# Patient Record
Sex: Male | Born: 1945 | Race: Black or African American | Hispanic: No | Marital: Married | Smoking: Never smoker
Health system: Southern US, Community
[De-identification: ages and names within clinical notes are randomized; demographics above are authoritative.]

## PROBLEM LIST (undated history)

## (undated) DIAGNOSIS — I10 Essential (primary) hypertension: Secondary | ICD-10-CM

## (undated) DIAGNOSIS — M1990 Unspecified osteoarthritis, unspecified site: Secondary | ICD-10-CM

## (undated) DIAGNOSIS — E7800 Pure hypercholesterolemia, unspecified: Secondary | ICD-10-CM

## (undated) DIAGNOSIS — M1611 Unilateral primary osteoarthritis, right hip: Secondary | ICD-10-CM

## (undated) DIAGNOSIS — E669 Obesity, unspecified: Secondary | ICD-10-CM

## (undated) DIAGNOSIS — K625 Hemorrhage of anus and rectum: Secondary | ICD-10-CM

## (undated) DIAGNOSIS — R011 Cardiac murmur, unspecified: Secondary | ICD-10-CM

## (undated) DIAGNOSIS — C61 Malignant neoplasm of prostate: Secondary | ICD-10-CM

## (undated) HISTORY — DX: Unspecified osteoarthritis, unspecified site: M19.90

## (undated) HISTORY — DX: Essential (primary) hypertension: I10

## (undated) HISTORY — PX: INGUINAL HERNIA REPAIR: SUR1180

## (undated) HISTORY — DX: Obesity, unspecified: E66.9

---

## 2002-06-13 ENCOUNTER — Encounter: Admission: RE | Admit: 2002-06-13 | Discharge: 2002-09-11 | Payer: Self-pay | Admitting: Family Medicine

## 2002-09-26 ENCOUNTER — Encounter: Admission: RE | Admit: 2002-09-26 | Discharge: 2002-12-25 | Payer: Self-pay | Admitting: Family Medicine

## 2002-12-18 ENCOUNTER — Ambulatory Visit (HOSPITAL_COMMUNITY): Admission: RE | Admit: 2002-12-18 | Discharge: 2002-12-18 | Payer: Self-pay | Admitting: Gastroenterology

## 2005-12-25 ENCOUNTER — Ambulatory Visit (HOSPITAL_BASED_OUTPATIENT_CLINIC_OR_DEPARTMENT_OTHER): Admission: RE | Admit: 2005-12-25 | Discharge: 2005-12-25 | Payer: Self-pay | Admitting: General Surgery

## 2005-12-25 ENCOUNTER — Encounter (INDEPENDENT_AMBULATORY_CARE_PROVIDER_SITE_OTHER): Payer: Self-pay | Admitting: *Deleted

## 2007-03-18 ENCOUNTER — Ambulatory Visit (HOSPITAL_BASED_OUTPATIENT_CLINIC_OR_DEPARTMENT_OTHER): Admission: RE | Admit: 2007-03-18 | Discharge: 2007-03-18 | Payer: Self-pay | Admitting: General Surgery

## 2007-03-18 ENCOUNTER — Encounter (INDEPENDENT_AMBULATORY_CARE_PROVIDER_SITE_OTHER): Payer: Self-pay | Admitting: General Surgery

## 2007-04-20 ENCOUNTER — Emergency Department (HOSPITAL_COMMUNITY): Admission: EM | Admit: 2007-04-20 | Discharge: 2007-04-20 | Payer: Self-pay | Admitting: Emergency Medicine

## 2009-08-10 DIAGNOSIS — C61 Malignant neoplasm of prostate: Secondary | ICD-10-CM

## 2009-08-10 HISTORY — PX: ROBOT ASSISTED LAPAROSCOPIC RADICAL PROSTATECTOMY: SHX5141

## 2009-08-10 HISTORY — DX: Malignant neoplasm of prostate: C61

## 2009-08-14 ENCOUNTER — Inpatient Hospital Stay (HOSPITAL_COMMUNITY): Admission: RE | Admit: 2009-08-14 | Discharge: 2009-08-15 | Payer: Self-pay | Admitting: Urology

## 2009-08-14 ENCOUNTER — Encounter (INDEPENDENT_AMBULATORY_CARE_PROVIDER_SITE_OTHER): Payer: Self-pay | Admitting: Urology

## 2010-10-26 LAB — BASIC METABOLIC PANEL
BUN: 17 mg/dL (ref 6–23)
CO2: 27 mEq/L (ref 19–32)
Calcium: 8.3 mg/dL — ABNORMAL LOW (ref 8.4–10.5)
Calcium: 8.6 mg/dL (ref 8.4–10.5)
Chloride: 103 mEq/L (ref 96–112)
Creatinine, Ser: 0.9 mg/dL (ref 0.4–1.5)
GFR calc Af Amer: >60 mL/min (ref >60)
GFR calc Af Amer: >60 mL/min (ref >60)
GFR calc non Af Amer: >60 mL/min (ref >60)
Glucose, Bld: 132 mg/dL — ABNORMAL HIGH (ref 70–99)
Potassium: 3.9 mEq/L (ref 3.5–5.1)
Sodium: 134 mEq/L — ABNORMAL LOW (ref 135–145)

## 2010-10-26 LAB — CBC
HCT: 36.3 % — ABNORMAL LOW (ref 39.0–52.0)
Hemoglobin: 11.9 g/dL — ABNORMAL LOW (ref 13.0–17.0)
MCHC: 32.7 g/dL (ref 30.0–36.0)
MCV: 85.3 fL (ref 78.0–100.0)
Platelets: 336 10*3/uL (ref 150–400)
RBC: 4.25 MIL/uL (ref 4.22–5.81)
RBC: 4.89 MIL/uL (ref 4.22–5.81)
RDW: 13.8 % (ref 11.5–15.5)
RDW: 14 % (ref 11.5–15.5)
WBC: 8.1 10*3/uL (ref 4.0–10.5)

## 2010-10-26 LAB — DIFFERENTIAL
Basophils Absolute: 0 10*3/uL (ref 0.0–0.1)
Basophils Absolute: 0 10*3/uL (ref 0.0–0.1)
Basophils Relative: 0 % (ref 0–1)
Eosinophils Absolute: 0 10*3/uL (ref 0.0–0.7)
Eosinophils Relative: 0 % (ref 0–5)
Lymphocytes Relative: 17 % (ref 12–46)
Lymphs Abs: 1.4 10*3/uL (ref 0.7–4.0)
Monocytes Absolute: 0.8 10*3/uL (ref 0.1–1.0)
Monocytes Relative: 2 % — ABNORMAL LOW (ref 3–12)
Neutro Abs: 19.2 10*3/uL — ABNORMAL HIGH (ref 1.7–7.7)
Neutro Abs: 5.9 10*3/uL (ref 1.7–7.7)
Neutrophils Relative %: 89 % — ABNORMAL HIGH (ref 43–77)

## 2010-10-26 LAB — TYPE AND SCREEN
ABO/RH(D): B POS
Antibody Screen: NEGATIVE

## 2010-10-26 LAB — ABO/RH: ABO/RH(D): B POS

## 2010-11-10 LAB — CBC
HCT: 41 % (ref 39.0–52.0)
Hemoglobin: 13.6 g/dL (ref 13.0–17.0)
MCV: 84.5 fL (ref 78.0–100.0)
RDW: 14.1 % (ref 11.5–15.5)

## 2010-12-23 NOTE — Op Note (Signed)
NAMECARRY, ORTEZ                ACCOUNT NO.:  [id number]   MEDICAL RECORD NO.:  [id number]          PATIENT TYPE:  AMB   LOCATION:  [facility]                         FACILITY:  [facility]   PHYSICIAN:  Anselm Pancoast. Weatherly, M.D.DATE OF BIRTH:  12/16/1945   DATE OF PROCEDURE:  03/18/2007  DATE OF DISCHARGE:                               OPERATIVE REPORT   PREOPERATIVE DIAGNOSIS:  Large right inguinal hernia.   POSTOPERATIVE DIAGNOSIS:  Large right inguinal hernia.   OPERATION:  Right inguinal herniorrhaphy, combination indirect direct.   ANESTHESIA:  General.   HISTORY:  Lance Rollins is a 65 year old fairly large male who has had a  previous left inguinal hernia repair by Dr. Lurene Shadow about ten years ago  and then I removed a large lipoma on his buttocks approximately a year  ago and he returns now with a symptomatic large right inguinal hernia  that he desires to have removed. This appears to be going down with the  cord structures and whether it is a direct or indirect, I was not  positive when I examined him in the office.  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   DESCRIPTION OF PROCEDURE:  The patient was taken to the operative suite.  He was given his antibiotic, Ancef, preoperatively, and we are planned  to do him under basically general anesthesia.  An LOA tube was placed  instead of an endotracheal tube and the groin area was first clipped and  then prepped with Betadine surgical scrub solution.  Dr. Danella Penton  incision was a fairly high transverse pediatric, I think with the large  size of the hernia, I would prefer doing one a little more oblique which  I did.  After draping in a sterile manner, I first block the  ilioinguinal nerve area using 10 mL of 0.25% Marcaine with adrenalin.  I  then made an inguinal incision.   Sharp dissection down through the  approximately 2-3 inches of adipose tissue.  Next, the external oblique  aponeurosis was opened a little bit laterally, it had completely  destroyed the external oblique, stretched it because of the large  hernia.  I then encompassed the cord structures, I think there were four  little veins in the subcutaneous tissue that were clamped and ligated  with fine Vicryl.  I then tried to get a Weitlaner in to actually hold  the external oblique, it really did not want to lie flat.  The nurse was  using Brewsters to expose the area and then I elevated the cord  structures with a Penrose drain. The hernia was about size of a big  orange and the cord structures were most intimately attached.  We tried  to separate this stretched hernia sac, and this is predominantly an  indirect hernia that has just been there for years, from the actual cord  structures.  There was a lot of fatty tissue, some that was  actually  intraperitoneally on the area, and we opened the cord structures and  then tried to separate everything from the cord structures and it  appeared that the veins and artery and vas that we kind of separated the  two with the hernia sort of pushing down sort of more central in the  cord structures than is usual typical. Exposure was very difficult and  we were able to get an additional scrub nurse from the main OR who  scrubbed in after about 1 1/2 hours and greatly appreciated her help.  With the added exposure, I was then actually able to kind of separate  the very thickened hernia sac so I could do a high sac ligation and this  was kind of a running 2-0 Vicryl closing everything going back with a  second layer of sutures and the best of my knowledge, I think the  artery, vein, and vas were separated.  There were several little veins.  Next, the actual floor, which had been stretched and basically destroyed  but not truly definite direct inguinal hernia,  was then closed with  running 2-0 Prolene suture reinforcing it and creating a new internal  ring.  The suture was taken back and sutured to the original start and  then I used a piece of Prolene mesh shaped like a sail, slit laterally,  and sutured into the under edge of the inguinal ligament inferiorly, the  two tails going around the newly created internal ring and these were  incorporated with the running suture laterally.  I was taking care not  to make the mesh too tight since I fear he is going to have some  testicular swelling anyway because of the degree of dissection that was  necessary.  The superior flap was sutured down with interrupted sutures  of 2-0 Prolene.  I then used about 10 mL of Marcaine with adrenalin in  the floor for immediate postoperative pain and put about another 5 mL in  the subcutaneous skin area.  Next, the superior flap was laying flat and  not under excessive tension, the ilioinguinal nerve was coming out with  the cord structures, and I then closed the external oblique with a  running 2-0 Vicryl.  Scarpa's fascia was closed with interrupted 3-0  Vicryl, 4-0 Dexon subcuticular, and then Benzoin and Steri-Strips on the  skin.  The patient's testicle was in its normal position and there was a  little bit of oozing of the skin and a nurse asked about Toradol and I  would prefer not giving him Toradol at this time.  The patient tolerated  the procedure.  I would like for him to void before he relieves, and  then will be kind of be cautious on his liquids and diet today,  hopefully not get distended or nauseous.  The patient will be seen in  the office in approximately a week and whether he will be ready to go  back to work in 2-3 weeks will be determined when I see him back in the  office.           ______________________________  Anselm Pancoast. Zachery Dakins, M.D.     WJW/MEDQ  D:  03/18/2007  T:  03/19/2007  Job:  [id number]   cc:   Vikki Ports, M.D.   Fax: [phone]

## 2010-12-26 NOTE — Op Note (Signed)
[name], [name]                ACCOUNT NO.:  [id number]   MEDICAL RECORD NO.:  [id number]          PATIENT TYPE:  AMB   LOCATION:  [facility]                          FACILITY:  [facility]   PHYSICIAN:  Anselm Pancoast. Weatherly, M.D.DATE OF BIRTH:  [birth year]   DATE OF PROCEDURE:  12/25/2005  DATE OF DISCHARGE:                                 OPERATIVE REPORT   PREOPERATIVE DIAGNOSES:  Lipoma left buttock. He has also got one on the  left cheek that he would like excised.   POSTOPERATIVE DIAGNOSES:  Not given.   OPERATION:  Excision of lipoma greater than 4 cm on left hip and  approximately 2 cm lipoma excision from left cheek.   ANESTHESIA:  Local anesthesia, 1% Xylocaine with adrenaline.   HISTORY:  Mr. Lance Rollins is a 65 year old male who was referred to me by  Dr. Theresia Lo for a buttock lesion that is greater than 4 cm that has been  there for several years. He has a family history of lipomas and Dr. Theresia Lo  first noticed this area on the hip about 2 years ago and advised him to  leave it alone. It appears to be increasing in size. He has had a brother  that recently had a very large lipoma removed from his back and the patient  said he would like to have it removed. When I saw him in the office, this  appeared to be an obvious benign lipoma, you could kind of push it around  and we ultrasounded it at the office to kind of confirm that it is spherical  and characteristically of a benign lipoma. The patient is on no blood  thinners and works for [employer]. He presented today and said he has  got one on his cheek that he had not noted, he pointed out to me when I saw  him in the office and would like to have it also excised. It is about 2 cm  and has been present for greater than 10 years but kind of gradually  increasing in size.   I positioned him first on the OR table laying right side down, hip slightly  flexed, and then prepped the area widely with Betadine solution and  draped  it in a sterile manner. A field block was used with about 10 mL of 1%  Xylocaine with adrenaline and then I injected over the area. It had been  marked circumferentially with a magic marker for sizing. An incision running  longitudinally was placed. Dissected down, the lipoma could be separated  from the surrounding tissue without too much difficulty and was a large  lipoma as we thought. The little pedicle for the bleeders were controlled  with 4-0 Vicryl and then the cavity was closed with some 4-0 Vicryl in the  fascia and then 3-0 nylon simple sutures on the skin. Next, using the same  prep but different gloves and etc., we prepped first the area of the face  after it had been marked and then made about a 1/2 inch incision and then  the underlying fatty tissue could  be teased from the surrounding normal  fatty tissue and it was completely excised. It was not necessary to put any  type of sutures for the pedicle on the one on the face and then the face was  closed with little interrupted simple  sutures of 5-0 nylon. The patient tolerated the procedure without any  significant discomfort and will be released. I will see him in the office  next week. I would like to remove the sutures from the face at the end of  the week but will leave the stitches on the buttocks because of the pressure  applied and his size for an additional week.           ______________________________  Anselm Pancoast. Zachery Dakins, M.D.     WJW/MEDQ  D:  12/25/2005  T:  12/25/2005  Job:  [id number]   cc:   Vikki Ports, M.D.  Fax: [phone]

## 2010-12-26 NOTE — Op Note (Signed)
   [name], Lance Rollins                            ACCOUNT NO.:  [id number]   MEDICAL RECORD NO.:  [id number]                   PATIENT TYPE:  AMB   LOCATION:  ENDO                                 FACILITY:  [facility]   PHYSICIAN:  Danise Edge, M.D.                DATE OF BIRTH:  03-Nov-1945   DATE OF PROCEDURE:  12/18/2002  DATE OF DISCHARGE:                                 OPERATIVE REPORT   REFERRING PHYSICIAN:  Vikki Ports, M.D.   PROCEDURE:  Screening colonoscopy.   PROCEDURE INDICATION:  Lance Rollins is a 65 year old male, born [birth year].  Lance Rollins is scheduled to undergo his first screening colonoscopy  with polypectomy to prevent colon cancer.   ENDOSCOPIST:  Charolett Bumpers, M.D.   PREMEDICATION:  1. Versed 10 mg.  2. Demerol 50 mg.   DESCRIPTION OF PROCEDURE:  After obtaining informed consent, Lance Rollins was  placed in the left lateral decubitus position.  I administered intravenous  Demerol and intravenous Versed to achieve conscious sedation for the  procedure.  The patient's blood pressure, oxygen saturation, and cardiac  rhythm were monitored throughout the procedure and documented in the medical  record.   Anal inspection was normal.  Digital rectal exam revealed a nonnodular  prostate.  The Olympus adult colonoscope was introduced into the rectum and  easily advanced to the cecum.  Colonic preparation for the exam today was  excellent.   RECTUM:  Normal.  SIGMOID COLON AND DESCENDING COLON:  Left colonic diverticulosis.  SPLENIC FLEXURE:  Normal.  TRANSVERSE COLON:  Colonic diverticulosis.  HEPATIC FLEXURE:  Normal.  ASCENDING COLON:  Normal.  CECUM AND ILEOCECAL VALVE:  Normal.    ASSESSMENT:  1. Colonic diverticulosis is noted in the transverse colon and left colon;     otherwise, normal proctocolonoscopy to the cecum.  2. No endoscopic evidence for the presence of colorectal neoplasia.     Danise Edge, M.D.    MJ/MEDQ  D:  12/18/2002  T:  12/18/2002  Job:  [id number]   cc:   Vikki Ports, M.D.  [address]  [place]  [place] [address]  Fax: [phone]

## 2011-05-25 LAB — DIFFERENTIAL
Lymphocytes Relative: 39
Lymphs Abs: 2.8
Monocytes Relative: 10
Neutro Abs: 3.5
Neutrophils Relative %: 47

## 2011-05-25 LAB — COMPREHENSIVE METABOLIC PANEL
Albumin: 3.6
BUN: 21
Calcium: 9.1
Glucose, Bld: 93
Sodium: 138
Total Protein: 7.3

## 2011-05-25 LAB — CBC
HCT: 40.8
Hemoglobin: 13.3
MCHC: 32.6
Platelets: 336
RDW: 13.7

## 2013-08-10 DIAGNOSIS — K625 Hemorrhage of anus and rectum: Secondary | ICD-10-CM

## 2013-08-10 HISTORY — DX: Hemorrhage of anus and rectum: K62.5

## 2013-08-14 ENCOUNTER — Encounter (HOSPITAL_COMMUNITY): Payer: Self-pay | Admitting: Emergency Medicine

## 2013-08-14 ENCOUNTER — Observation Stay (HOSPITAL_COMMUNITY)
Admission: EM | Admit: 2013-08-14 | Discharge: 2013-08-15 | Disposition: A | Discharge status: Home / Self Care | Payer: Medicare Other | Attending: Family Medicine | Admitting: Family Medicine

## 2013-08-14 DIAGNOSIS — R42 Dizziness and giddiness: Secondary | ICD-10-CM | POA: Insufficient documentation

## 2013-08-14 DIAGNOSIS — R1012 Left upper quadrant pain: Secondary | ICD-10-CM | POA: Insufficient documentation

## 2013-08-14 DIAGNOSIS — K317 Polyp of stomach and duodenum: Secondary | ICD-10-CM

## 2013-08-14 DIAGNOSIS — D131 Benign neoplasm of stomach: Secondary | ICD-10-CM | POA: Insufficient documentation

## 2013-08-14 DIAGNOSIS — D62 Acute posthemorrhagic anemia: Secondary | ICD-10-CM | POA: Insufficient documentation

## 2013-08-14 DIAGNOSIS — E7800 Pure hypercholesterolemia, unspecified: Secondary | ICD-10-CM | POA: Insufficient documentation

## 2013-08-14 DIAGNOSIS — K2970 Gastritis, unspecified, without bleeding: Secondary | ICD-10-CM

## 2013-08-14 DIAGNOSIS — K2990 Gastroduodenitis, unspecified, without bleeding: Secondary | ICD-10-CM

## 2013-08-14 DIAGNOSIS — K921 Melena: Secondary | ICD-10-CM

## 2013-08-14 DIAGNOSIS — K922 Gastrointestinal hemorrhage, unspecified: Secondary | ICD-10-CM | POA: Diagnosis present

## 2013-08-14 DIAGNOSIS — Z8546 Personal history of malignant neoplasm of prostate: Secondary | ICD-10-CM | POA: Insufficient documentation

## 2013-08-14 DIAGNOSIS — K2940 Chronic atrophic gastritis without bleeding: Secondary | ICD-10-CM | POA: Insufficient documentation

## 2013-08-14 HISTORY — DX: Hemorrhage of anus and rectum: K62.5

## 2013-08-14 HISTORY — DX: Malignant neoplasm of prostate: C61

## 2013-08-14 HISTORY — DX: Pure hypercholesterolemia, unspecified: E78.00

## 2013-08-14 LAB — COMPREHENSIVE METABOLIC PANEL
Albumin: 3.3 g/dL — ABNORMAL LOW (ref 3.5–5.2)
Alkaline Phosphatase: 75 U/L (ref 39–117)
BUN: 30 mg/dL — ABNORMAL HIGH (ref 6–23)
CO2: 23 mEq/L (ref 19–32)
Chloride: 104 mEq/L (ref 96–112)
GFR calc Af Amer: 84 mL/min — ABNORMAL LOW (ref >90)
GFR calc non Af Amer: 73 mL/min — ABNORMAL LOW (ref >90)
Glucose, Bld: 119 mg/dL — ABNORMAL HIGH (ref 70–99)
Potassium: 4.3 mEq/L (ref 3.7–5.3)
Total Bilirubin: 0.4 mg/dL (ref 0.3–1.2)

## 2013-08-14 LAB — CBC
HCT: 31.2 % — ABNORMAL LOW (ref 39.0–52.0)
Hemoglobin: 10.4 g/dL — ABNORMAL LOW (ref 13.0–17.0)
MCH: 28 pg (ref 26.0–34.0)
MCHC: 33.3 g/dL (ref 30.0–36.0)
MCV: 83.9 fL (ref 78.0–100.0)
Platelets: 297 10*3/uL (ref 150–400)
RBC: 3.72 MIL/uL — ABNORMAL LOW (ref 4.22–5.81)
RDW: 14.7 % (ref 11.5–15.5)
WBC: 9.3 10*3/uL (ref 4.0–10.5)

## 2013-08-14 LAB — TYPE AND SCREEN
ABO/RH(D): B POS
Antibody Screen: NEGATIVE

## 2013-08-14 LAB — COMPREHENSIVE METABOLIC PANEL WITH GFR
ALT: 18 U/L (ref 0–53)
AST: 15 U/L (ref 0–37)
Calcium: 9 mg/dL (ref 8.4–10.5)
Creatinine, Ser: 1.05 mg/dL (ref 0.50–1.35)
Sodium: 140 meq/L (ref 137–147)
Total Protein: 6.8 g/dL (ref 6.0–8.3)

## 2013-08-14 LAB — OCCULT BLOOD, POC DEVICE: Fecal Occult Bld: POSITIVE — AB

## 2013-08-14 LAB — HEMOGLOBIN AND HEMATOCRIT, BLOOD
HEMATOCRIT: 29.4 % — AB (ref 39.0–52.0)
HEMATOCRIT: 27.6 % — AB (ref 39.0–52.0)
HEMOGLOBIN: 9.7 g/dL — AB (ref 13.0–17.0)
Hemoglobin: 9.3 g/dL — ABNORMAL LOW (ref 13.0–17.0)

## 2013-08-14 LAB — ABO/RH: ABO/RH(D): B POS

## 2013-08-14 MED ORDER — PANTOPRAZOLE SODIUM 40 MG IV SOLR
40.0000 mg | Freq: Two times a day (BID) | INTRAVENOUS | Status: DC
  Administered 2013-08-14 – 2013-08-15 (×2): 40 mg via INTRAVENOUS
  Filled 2013-08-14 (×5): qty 40

## 2013-08-14 MED ORDER — SODIUM CHLORIDE 0.9 % IV BOLUS (SEPSIS)
1000.0000 mL | Freq: Once | INTRAVENOUS | Status: AC
  Administered 2013-08-14: 1000 mL via INTRAVENOUS

## 2013-08-14 MED ORDER — ONDANSETRON HCL 4 MG PO TABS: 4.0000 mg | ORAL_TABLET | Freq: Four times a day (QID) | ORAL | Status: DC | PRN

## 2013-08-14 MED ORDER — SODIUM CHLORIDE 0.9 % IV SOLN
INTRAVENOUS | Status: DC
  Administered 2013-08-14: 13:00:00 via INTRAVENOUS

## 2013-08-14 MED ORDER — PANTOPRAZOLE SODIUM 40 MG IV SOLR
40.0000 mg | Freq: Once | INTRAVENOUS | Status: AC
  Administered 2013-08-14: 40 mg via INTRAVENOUS
  Filled 2013-08-14: qty 40

## 2013-08-14 MED ORDER — ONDANSETRON HCL 4 MG/2ML IJ SOLN: 4.0000 mg | Freq: Four times a day (QID) | INTRAMUSCULAR | Status: DC | PRN

## 2013-08-14 NOTE — H&P (Addendum)
PCP:   No primary provider on file.   Chief Complaint:  Black colored stools  HPI:  68 year old male who  has a past medical history of Hypercholesteremia. today presented to the ED, after patient started having black colored stools intermittently with bloody stools over the past few days. Patient says that he was seen at urgent care who put on Zantac and asked him to stop Meloxicam  and we she has been taking for over a year. Patient denies any abdominal pain, he did have diarrhea with 3 episodes last night. Denies nausea vomiting or diarrhea. He also felt dizzy last night when he woke up to go to bathroom. Patient had colonoscopy in the past in 2007, which was normal as per patient. He denies chest pain, no shortness of breath no fever no sweating. Allergies:  No Known Allergies    Past Medical History  Diagnosis Date  . Hypercholesteremia     History reviewed. No pertinent past surgical history.  Prior to Admission medications   Medication Sig Start Date End Date Taking? Authorizing Provider  acetaminophen (TYLENOL) 500 MG tablet Take 1,000 mg by mouth every 8 (eight) hours as needed for mild pain.   Yes Historical Provider, MD  meloxicam (MOBIC) 15 MG tablet Take 15 mg by mouth daily.   Yes Historical Provider, MD  omeprazole (PRILOSEC) 40 MG capsule Take 40 mg by mouth daily. For 30 days starting on 08/12/13   Yes Historical Provider, MD  simvastatin (ZOCOR) 40 MG tablet Take 40 mg by mouth every evening.   Yes Historical Provider, MD    Social History:  reports that he has never smoked. He does not have any smokeless tobacco history on file. He reports that he drinks alcohol. He reports that he does not use illicit drugs.    All the positives are listed in BOLD  Review of Systems:  HEENT: Headache, blurred vision, runny nose, sore throat Neck: Hypothyroidism, hyperthyroidism,,lymphadenopathy Chest : Shortness of breath, history of COPD, Asthma Heart : Chest pain, history  of coronary arterey disease GI:  Nausea, vomiting, diarrhea, constipation, GERD GU: Dysuria, urgency, frequency of urination, hematuria Neuro: Stroke, seizures, syncope Psych: Depression, anxiety, hallucinations   Physical Exam: Blood pressure 142/81, pulse 110, temperature 98.2 F (36.8 C), temperature source Oral, resp. rate 19, weight 108.863 kg (240 lb), SpO2 100.00%. Constitutional:   Patient is a well-developed and well-nourished male* in no acute distress and cooperative with exam. Head: Normocephalic and atraumatic Mouth: Mucus membranes moist Eyes: PERRL, EOMI, conjunctivae normal Neck: Supple, No Thyromegaly Cardiovascular: RRR, S1 normal, S2 normal Pulmonary/Chest: CTAB, no wheezes, rales, or rhonchi Abdominal: Soft. Non-tender, non-distended, bowel sounds are normal, no masses, organomegaly, or guarding present.  Neurological: A&O x3, Strenght is normal and symmetric bilaterally, cranial nerve II-XII are grossly intact, no focal motor deficit, sensory intact to light touch bilaterally.  Extremities : No Cyanosis, Clubbing or Edema   Labs on Admission:  Results for orders placed during the hospital encounter of 08/14/13 (from the past 48 hour(s))  COMPREHENSIVE METABOLIC PANEL     Status: Abnormal   Collection Time    08/14/13  9:00 AM      Result Value Range   Sodium 140  137 - 147 mEq/L   Potassium 4.3  3.7 - 5.3 mEq/L   Chloride 104  96 - 112 mEq/L   CO2 23  19 - 32 mEq/L   Glucose, Bld 119 (*) 70 - 99 mg/dL   BUN 30 (*) 6 -  23 mg/dL   Creatinine, Ser 1.05  0.50 - 1.35 mg/dL   Calcium 9.0  8.4 - 10.5 mg/dL   Total Protein 6.8  6.0 - 8.3 g/dL   Albumin 3.3 (*) 3.5 - 5.2 g/dL   AST 15  0 - 37 U/L   ALT 18  0 - 53 U/L   Alkaline Phosphatase 75  39 - 117 U/L   Total Bilirubin 0.4  0.3 - 1.2 mg/dL   GFR calc non Af Amer 73 (*) >90 mL/min   GFR calc Af Amer 84 (*) >90 mL/min   Comment: (NOTE)     The eGFR has been calculated using the CKD EPI equation.     This  calculation has not been validated in all clinical situations.     eGFR's persistently <90 mL/min signify possible Chronic Kidney     Disease.  CBC     Status: Abnormal   Collection Time    08/14/13  9:00 AM      Result Value Range   WBC 9.3  4.0 - 10.5 K/uL   RBC 3.72 (*) 4.22 - 5.81 MIL/uL   Hemoglobin 10.4 (*) 13.0 - 17.0 g/dL   HCT 31.2 (*) 39.0 - 52.0 %   MCV 83.9  78.0 - 100.0 fL   MCH 28.0  26.0 - 34.0 pg   MCHC 33.3  30.0 - 36.0 g/dL   RDW 14.7  11.5 - 15.5 %   Platelets 297  150 - 400 K/uL  TYPE AND SCREEN     Status: None   Collection Time    08/14/13  9:05 AM      Result Value Range   ABO/RH(D) B POS     Antibody Screen NEG     Sample Expiration 08/17/2013    OCCULT BLOOD, POC DEVICE     Status: Abnormal   Collection Time    08/14/13 10:13 AM      Result Value Range   Fecal Occult Bld POSITIVE (*) NEGATIVE    Radiological Exams on Admission: No results found.  Assessment/Plan Principal Problem:   Melena Active Problems:   GI bleed  Melena Will admit the patient, start IV fluids and Protonix 40 mg IV every 12 hours. Patient most likely has gastric lesions secondary to long-term use of Meloxicam, will get GI consult for endoscopy. Patient's hemoglobin and hematocrit are stable at this time, will continue to monitor the H&H q. 8 hours. With type and screen 2 units of PRBC in case he needs it  Code status: Patient is full code  Family discussion: Discussed with patient's wife at bedside   Time Spent on Admission: 6 min  [facility] Pager: [phone] 08/14/2013, 11:28 AM  If 7PM-7AM, please contact night-coverage  [email or web address]  Password [email or web address]

## 2013-08-14 NOTE — Consult Note (Signed)
   Consultation  Referring Provider: [facility]     Primary Care Physician:  [facility] Primary Gastroenterologist: Dr. Johnson ([facility])        Reason for Consultation: GI Bleed             HPI:   Lance Rollins is a 67 y.o. male admitted through ED this am with black stools. He has taken Mobic for a year. On New Year's he began passing black stool. PCP started him on Prilosec but then yesterday black stools contained some bright red blood, patient came to ED. No abdominal pain. No nausea. No other GI complaints.  Past Medical History  Diagnosis Date  . Hypercholesteremia   . Prostate cancer    PSH: Right inguinal hernia repair.   History  Substance Use Topics  . Smoking status: Never Smoker   . Smokeless tobacco: Not on file  . Alcohol Use: Yes     Comment: occ    Prior to Admission medications   Medication Sig Start Date End Date Taking? Authorizing Provider  acetaminophen (TYLENOL) 500 MG tablet Take 1,000 mg by mouth every 8 (eight) hours as needed for mild pain.   Yes Historical Provider, MD  meloxicam (MOBIC) 15 MG tablet Take 15 mg by mouth daily.   Yes Historical Provider, MD  omeprazole (PRILOSEC) 40 MG capsule Take 40 mg by mouth daily. For 30 days starting on 08/12/13   Yes Historical Provider, MD  simvastatin (ZOCOR) 40 MG tablet Take 40 mg by mouth every evening.   Yes Historical Provider, MD    No current facility-administered medications for this encounter.   Current Outpatient Prescriptions  Medication Sig Dispense Refill  . acetaminophen (TYLENOL) 500 MG tablet Take 1,000 mg by mouth every 8 (eight) hours as needed for mild pain.      . meloxicam (MOBIC) 15 MG tablet Take 15 mg by mouth daily.      . omeprazole (PRILOSEC) 40 MG capsule Take 40 mg by mouth daily. For 30 days starting on 08/12/13      . simvastatin (ZOCOR) 40 MG tablet Take 40 mg by mouth every evening.        Allergies as of 08/14/2013  . (No Known Allergies)   Review of  Systems:    All systems reviewed and negative except where noted in HPI.   Physical Exam:  Vital signs in last 24 hours: Temp:  [98.2 F (36.8 C)] 98.2 F (36.8 C) (01/05 0827) Pulse Rate:  [77-117] 90 (01/05 1134) Resp:  [18-19] 18 (01/05 1134) BP: (126-149)/(56-89) 126/56 mmHg (01/05 1134) SpO2:  [97 %-100 %] 97 % (01/05 1134) Weight:  [240 lb (108.863 kg)] 240 lb (108.863 kg) (01/05 0827)   General:   Pleasant  black male in NAD Head:  Normocephalic and atraumatic. Eyes:   No icterus.   Conjunctiva pink. Ears:  Normal auditory acuity. Neck:  Supple; no masses felt Lungs:  Respirations even and unlabored. Lungs clear to auscultation bilaterally.   No wheezes, crackles, or rhonchi.  Heart:  Regular rate and rhythm Abdomen:  Soft, nondistended, nontender. Normal bowel sounds. No appreciable masses or hepatomegaly.  Rectal:  Scant melenic stool in vault.  Msk:  Symmetrical without gross deformities.  Extremities:  Without edema. Neurologic:  Alert and  oriented x4;  grossly normal neurologically. Skin:  Intact without significant lesions or rashes. Cervical Nodes:  No significant cervical adenopathy. Psych:  Alert and cooperative. Normal affect.  LAB RESULTS:  Recent Labs    08/14/13 0900  WBC 9.3  HGB 10.4*  HCT 31.2*  PLT 297   BMET  Recent Labs  08/14/13 0900  NA 140  K 4.3  CL 104  CO2 23  GLUCOSE 119*  BUN 30*  CREATININE 1.05  CALCIUM 9.0   LFT  Recent Labs  08/14/13 0900  PROT 6.8  ALBUMIN 3.3*  AST 15  ALT 18  ALKPHOS 75  BILITOT 0.4    PREVIOUS ENDOSCOPIES:            Remote colonoscopy ([facility])   Impression / Plan:   82, 68 year old male with hemodynamically stable upper GI bleed. Rule out erosive disease / PUD in setting of mobic. Patient will be scheduled for EGD tomorrow. He can have clears today. Continue IV PPI.   2. Anemia of acute blood loss. Hgb 9.7, down from baseline of mid 13. Monitor CBC, transfuse if necessary.    Thanks  LOS: 0 days   Lance Rollins  08/14/2013, 12:00 PM    ________________________________________________________________________  Lance Rollins GI MD note:  I personally examined the patient, reviewed the data and agree with the assessment and plan described above.  Likely UGI bleeding.  Planning on EGD tomorrow.  If no clear cause of his bleeding then will probably proceed with colonoscopy as IP.   Lance Loffler, MD [facility] Pager [phone]

## 2013-08-14 NOTE — ED Provider Notes (Signed)
CSN: [id number]     Arrival date & time 08/14/13  8144 History   First MD Initiated Contact with Patient 08/14/13 [phone]     Chief Complaint  Patient presents with  . Rectal Bleeding  . Abdominal Pain    HPI  68 y/o male with history as noted below who presents with cc of abdominal pain and bloody bowel movements. The patient states that beginning on New Years he has been having bloody stools. He states that they initially began as dark stools but has since turned to grossly bloody. He states he is having bright red bloody stools with each bowel movement. He was seen at Urgent care and started on zantac and told to stop taking meloxicam. He was instructed to follow up in 2 weeks or go to the emergency room if his symptoms worsen. He has complied with both the new medication as well as discontinuing the meloxicam. He states he has had increased bleeding and then yesterday developed abdominal pain which prompted him to come for an evaluation. Had colonoscopy in past which he reports as normal.   Past Medical History  Diagnosis Date  . Hypercholesteremia   . Prostate cancer    History reviewed. No pertinent past surgical history. History reviewed. No pertinent family history. History  Substance Use Topics  . Smoking status: Never Smoker   . Smokeless tobacco: Not on file  . Alcohol Use: Yes     Comment: occ   Review of Systems  Constitutional: Negative for fever and chills.  Respiratory: Negative for cough and shortness of breath.   Cardiovascular: Negative for chest pain.  Gastrointestinal: Positive for abdominal pain and blood in stool. Negative for nausea and vomiting.  Genitourinary: Negative for dysuria and frequency.  Neurological: Positive for dizziness.  All other systems reviewed and are negative.   Allergies  Review of patient's allergies indicates no known allergies.  Home Medications   No current outpatient prescriptions on file. BP 136/74  Pulse 80  Temp(Src) 98 F  (36.7 C) (Oral)  Resp 18  Ht 5\' 7"  (1.702 m)  Wt 236 lb 8 oz (107.276 kg)  BMI 37.03 kg/m2  SpO2 100% Physical Exam  Constitutional: He is oriented to person, place, and time. He appears well-developed and well-nourished. No distress.  HENT:  Head: Normocephalic and atraumatic.  Mouth/Throat: No oropharyngeal exudate.  Eyes: Conjunctivae are normal. Pupils are equal, round, and reactive to light.  Neck: Normal range of motion. Neck supple.  Cardiovascular: Normal rate and normal heart sounds.  Exam reveals no gallop and no friction rub.   No murmur heard. Pulmonary/Chest: Effort normal and breath sounds normal.  Abdominal: Soft. He exhibits no distension. There is no tenderness.  Musculoskeletal: Normal range of motion. He exhibits no edema and no tenderness.  Neurological: He is alert and oriented to person, place, and time. He has normal strength and normal reflexes. No cranial nerve deficit or sensory deficit. Coordination normal. GCS eye subscore is 4. GCS verbal subscore is 5. GCS motor subscore is 6.  Skin: Skin is warm and dry.   ED Course  Procedures (including critical care time) Labs Review Labs Reviewed  COMPREHENSIVE METABOLIC PANEL - Abnormal; Notable for the following:    Glucose, Bld 119 (*)    BUN 30 (*)    Albumin 3.3 (*)    GFR calc non Af Amer 73 (*)    GFR calc Af Amer 84 (*)    All other components within normal limits  CBC - Abnormal; Notable for the following:    RBC 3.72 (*)    Hemoglobin 10.4 (*)    HCT 31.2 (*)    All other components within normal limits  HEMOGLOBIN AND HEMATOCRIT, BLOOD - Abnormal; Notable for the following:    Hemoglobin 9.7 (*)    HCT 29.4 (*)    All other components within normal limits  OCCULT BLOOD, POC DEVICE - Abnormal; Notable for the following:    Fecal Occult Bld POSITIVE (*)    All other components within normal limits  HEMOGLOBIN AND HEMATOCRIT, BLOOD  HEMOGLOBIN AND HEMATOCRIT, BLOOD  TYPE AND SCREEN  ABO/RH    Imaging Review No results found.  EKG Interpretation    Date/Time:    Ventricular Rate:    PR Interval:    QRS Duration:   QT Interval:    QTC Calculation:   R Axis:     Text Interpretation:             MDM   1. GI bleed   2. Melena     Here with hematochezia. Has had abdominal pain but abdominal exam is benign. Hemoglobin down from previous. Orthostatic vital signs. BUN up slightly Likely UGIB. Protonix given. Given worsening symptoms will admit for serial CBC's and GI evaluation.     Donita Brooks, MD 08/14/13 [phone]

## 2013-08-14 NOTE — ED Notes (Signed)
Pt c/o abd pain and rectal bleeding with red and black stool x 3 episodes last night; pt denies taking blood thinners

## 2013-08-14 NOTE — ED Provider Notes (Signed)
I saw and evaluated the patient, reviewed the resident's note and I agree with the findings and plan.  EKG Interpretation   None       Patient reports that he used to be on Celebrex, was switched to Mobic couple months ago, admits to eating heavy and spicy foods over the holidays and since then has noticed dark stools changing to dark red stools over the last day. He endorses some upper epigastric discomfort associated with generalized weakness, lightheadedness. He denies chest pain or syncope, denies any shortness of breath. Patient reports that he did have a colonoscopy with the [facility] in the past, was told completely normal, no polyps, tumors, diverticulosis. On exam, the patient has minimal pain and tenderness in the left upper quadrant without any guarding or rebound. No lower abdominal tenderness. He is obese and abdomen is slightly distended but soft. Patient's hemoglobin today is low at 10.4, no recent comparisons. Patient was initially mildly tachycardic but normotensive. Patient subjectively is slightly orthostatic although no significant changes subjectively. Patient is heme positive from below, plan is to admit for observation, serial hemoglobins and possibly GI consultation as I suspect this may be a slow upper GI bleed as BUN is 30 with normal Cr. IV Protonix has been administered, the patient does normally take omeprazole at home.        Saddie Benders. Shondell Poulson, MD 08/14/13 1028

## 2013-08-15 ENCOUNTER — Encounter: Payer: Self-pay | Admitting: Gastroenterology

## 2013-08-15 ENCOUNTER — Encounter (HOSPITAL_COMMUNITY): Payer: Self-pay | Admitting: *Deleted

## 2013-08-15 ENCOUNTER — Encounter (HOSPITAL_COMMUNITY)
Admission: EM | Disposition: A | Discharge status: Home / Self Care | Payer: Self-pay | Source: Home / Self Care | Attending: Family Medicine

## 2013-08-15 DIAGNOSIS — K317 Polyp of stomach and duodenum: Secondary | ICD-10-CM

## 2013-08-15 DIAGNOSIS — K2970 Gastritis, unspecified, without bleeding: Secondary | ICD-10-CM

## 2013-08-15 DIAGNOSIS — D131 Benign neoplasm of stomach: Secondary | ICD-10-CM

## 2013-08-15 DIAGNOSIS — K921 Melena: Principal | ICD-10-CM

## 2013-08-15 DIAGNOSIS — K2990 Gastroduodenitis, unspecified, without bleeding: Secondary | ICD-10-CM

## 2013-08-15 DIAGNOSIS — K922 Gastrointestinal hemorrhage, unspecified: Secondary | ICD-10-CM

## 2013-08-15 HISTORY — PX: ESOPHAGOGASTRODUODENOSCOPY: SHX5428

## 2013-08-15 LAB — COMPREHENSIVE METABOLIC PANEL
ALK PHOS: 58 U/L (ref 39–117)
ALT: 14 U/L (ref 0–53)
AST: 13 U/L (ref 0–37)
Albumin: 2.9 g/dL — ABNORMAL LOW (ref 3.5–5.2)
BUN: 14 mg/dL (ref 6–23)
CO2: 25 meq/L (ref 19–32)
Calcium: 8.3 mg/dL — ABNORMAL LOW (ref 8.4–10.5)
Chloride: 101 mEq/L (ref 96–112)
Creatinine, Ser: 0.84 mg/dL (ref 0.50–1.35)
GFR calc Af Amer: >90 mL/min (ref >90)
GFR, EST NON AFRICAN AMERICAN: 90 mL/min — AB (ref >90)
GLUCOSE: 93 mg/dL (ref 70–99)
POTASSIUM: 3.9 meq/L (ref 3.7–5.3)
SODIUM: 137 meq/L (ref 137–147)
Total Bilirubin: 0.7 mg/dL (ref 0.3–1.2)
Total Protein: 5.9 g/dL — ABNORMAL LOW (ref 6.0–8.3)

## 2013-08-15 LAB — CBC
HCT: 27.1 % — ABNORMAL LOW (ref 39.0–52.0)
HEMOGLOBIN: 9 g/dL — AB (ref 13.0–17.0)
MCH: 27.8 pg (ref 26.0–34.0)
MCHC: 33.2 g/dL (ref 30.0–36.0)
MCV: 83.6 fL (ref 78.0–100.0)
PLATELETS: 261 10*3/uL (ref 150–400)
RBC: 3.24 MIL/uL — AB (ref 4.22–5.81)
RDW: 14.8 % (ref 11.5–15.5)
WBC: 9.7 10*3/uL (ref 4.0–10.5)

## 2013-08-15 LAB — HEMOGLOBIN AND HEMATOCRIT, BLOOD
HCT: 27.8 % — ABNORMAL LOW (ref 39.0–52.0)
Hemoglobin: 9.2 g/dL — ABNORMAL LOW (ref 13.0–17.0)

## 2013-08-15 SURGERY — EGD (ESOPHAGOGASTRODUODENOSCOPY)
Anesthesia: Moderate Sedation

## 2013-08-15 MED ORDER — PANTOPRAZOLE SODIUM 40 MG PO TBEC
40.0000 mg | DELAYED_RELEASE_TABLET | Freq: Two times a day (BID) | ORAL | Status: DC
  Administered 2013-08-15: 40 mg via ORAL

## 2013-08-15 MED ORDER — BUTAMBEN-TETRACAINE-BENZOCAINE 2-2-14 % EX AERO
INHALATION_SPRAY | CUTANEOUS | Status: DC | PRN
  Administered 2013-08-15: 2 via TOPICAL

## 2013-08-15 MED ORDER — FENTANYL CITRATE 0.05 MG/ML IJ SOLN
INTRAMUSCULAR | Status: DC | PRN
  Administered 2013-08-15 (×2): 25 ug via INTRAVENOUS

## 2013-08-15 MED ORDER — MIDAZOLAM HCL 10 MG/2ML IJ SOLN
INTRAMUSCULAR | Status: DC | PRN
  Administered 2013-08-15: 2 mg via INTRAVENOUS
  Administered 2013-08-15: 1 mg via INTRAVENOUS

## 2013-08-15 MED ORDER — PANTOPRAZOLE SODIUM 40 MG PO TBEC: 40.0000 mg | DELAYED_RELEASE_TABLET | Freq: Two times a day (BID) | ORAL | Status: DC

## 2013-08-15 MED ORDER — SODIUM CHLORIDE 0.9 % IV SOLN
INTRAVENOUS | Status: DC
Start: 2013-08-15 — End: 2013-08-15
  Administered 2013-08-15: 500 mL via INTRAVENOUS

## 2013-08-15 NOTE — Op Note (Signed)
[facility] [place], [address]   ENDOSCOPY PROCEDURE REPORT  PATIENT: Lance, Rollins  MR#: [id number] BIRTHDATE: 1946-03-11 , 85  yrs. old GENDER: Male ENDOSCOPIST: Milus Banister, MD PROCEDURE DATE:  08/15/2013 PROCEDURE:  EGD w/ biopsy and EGD w/ snare technique polypectomy ASA CLASS:     Class II INDICATIONS:  melena, anemia. MEDICATIONS: Fentanyl 50 mcg IV and Versed 5 mg IV TOPICAL ANESTHETIC: Cetacaine Spray  DESCRIPTION OF PROCEDURE: After the risks benefits and alternatives of the procedure were thoroughly explained, informed consent was obtained.  The Pentax Gastroscope [id number] endoscope was introduced through the mouth and advanced to the second portion of the duodenum. Without limitations.  The instrument was slowly withdrawn as the mucosa was fully examined.    There was mild, diffuse non-specific gastritis.  Biopsies were taken from antrum and sent to pathology.  There was a fleshy, 40mm polyp along greater curve of gastric body.  This polyp bled with minimal trauma from scope and it was removed with snare/cautery.  The examination was otherwise normal.  Retroflexed views revealed no abnormalities.     The scope was then withdrawn from the patient and the procedure completed. COMPLICATIONS: There were no complications. ENDOSCOPIC IMPRESSION: There was mild, diffuse non-specific gastritis.  Biopsies were taken from antrum and sent to pathology.  There was a fleshy, 66mm polyp along greater curve of gastric body.  This polyp bled with minimal trauma from scope and it was removed with snare/cautery.  The examination was otherwise normal.  RECOMMENDATIONS: OK to d/c home later today.  He should stay on PPI twice daily. [facility] follow up appt with me in 4-5 weeks, will consider outpatient screening colonoscopy from that appointment and probably decrease PPI to once daily.   eSigned:  Milus Banister, MD 08/15/2013 10:55  AM

## 2013-08-15 NOTE — Interval H&P Note (Signed)
History and Physical Interval Note:  08/15/2013 9:44 AM  Lance Rollins  has presented today for surgery, with the diagnosis of upper gastrointestinal bleed  The various methods of treatment have been discussed with the patient and family. After consideration of risks, benefits and other options for treatment, the patient has consented to  Procedure(s): ESOPHAGOGASTRODUODENOSCOPY (EGD) (N/A) as a surgical intervention .  The patient's history has been reviewed, patient examined, no change in status, stable for surgery.  I have reviewed the patient's chart and labs.  Questions were answered to the patient's satisfaction.     Milus Banister

## 2013-08-15 NOTE — Discharge Summary (Addendum)
Physician Discharge Summary  Lance Rollins RSW:[id number] DOB: [birth year] DOA: 08/14/2013  PCP:  Melinda Crutch, MD  Admit date: 08/14/2013 Discharge date: 08/15/2013  Time spent: 50* minutes  Recommendations for Outpatient Follow-up:  1. Follow up gastroenterology in 4 weeks  Discharge Diagnoses:  Principal Problem:   Melena Active Problems:   GI bleed   Unspecified gastritis and gastroduodenitis without mention of hemorrhage   Gastric polyp   Discharge Condition: Stable  Diet recommendation: low salt diet  Filed Weights   08/14/13 0827 08/14/13 1215  Weight: 108.863 kg (240 lb) 107.276 kg (236 lb 8 oz)    History of present illness:  68 year old male who has a past medical history of Hypercholesteremia. today presented to the ED, after patient started having black colored stools intermittently with bloody stools over the past few days. Patient says that he was seen at urgent care who put on Zantac and asked him to stop Meloxicam and we she has been taking for over a year. Patient denies any abdominal pain, he did have diarrhea with 3 episodes last night. Denies nausea vomiting or diarrhea. He also felt dizzy last night when he woke up to go to bathroom.  Patient had colonoscopy in the past in 2007, which was normal as per patient.  He denies chest pain, no shortness of breath no fever no sweating.  Allergies:   Hospital Course:  Melena  Patient underwent EGD which showed ENDOSCOPIC IMPRESSION:  There was mild, diffuse non-specific gastritis. Biopsies were taken  from antrum and sent to pathology. There was a fleshy, 53mm polyp  along greater curve of gastric body. This polyp bled with minimal  trauma from scope and it was removed with snare/cautery. The  examination was otherwise normal Hemoglobin has remained stable, did not require transfusion. Patient will be discharged on protonix 40 mg po BID, will follow up [facility] as outpatient, and colonoscopy as  outpatient.  Procedures:  EGD  Consultations:  GI  Discharge Exam: Filed Vitals:   08/15/13 1120  BP: 126/83  Pulse: 76  Temp:   Resp: 19    General: *Appear in no acute distress Cardiovascular: *S1s2 RRR Respiratory: *Clear bilaterally  Discharge Instructions  Discharge Orders   Future Appointments Provider Department Dept Phone   09/13/2013 10:00 AM Milus Banister, MD [facility] [phone]   Future Orders Complete By Expires   Diet - low sodium heart healthy  As directed    Increase activity slowly  As directed        Medication List    STOP taking these medications       meloxicam 15 MG tablet  Commonly known as:  MOBIC     omeprazole 40 MG capsule  Commonly known as:  PRILOSEC      TAKE these medications       acetaminophen 500 MG tablet  Commonly known as:  TYLENOL  Take 1,000 mg by mouth every 8 (eight) hours as needed for mild pain.     pantoprazole 40 MG tablet  Commonly known as:  PROTONIX  Take 1 tablet (40 mg total) by mouth 2 (two) times daily before a meal.     simvastatin 40 MG tablet  Commonly known as:  ZOCOR  Take 40 mg by mouth every evening.       No Known Allergies     Follow-up Information   Follow up with Milus Banister, MD. Schedule an appointment as soon as possible for a visit in 4  weeks.   Specialty:  Gastroenterology   Contact information:   [address] [place] [address] [phone]        The results of significant diagnostics from this hospitalization (including imaging, microbiology, ancillary and laboratory) are listed below for reference.    Significant Diagnostic Studies: No results found.  Microbiology: No results found for this or any previous visit (from the past 240 hour(s)).   Labs: Basic Metabolic Panel:  Recent Labs Lab 08/14/13 0900 08/15/13 0603  NA 140 137  K 4.3 3.9  CL 104 101  CO2 23 25  GLUCOSE 119* 93  BUN 30* 14  CREATININE 1.05 0.84   CALCIUM 9.0 8.3*   Liver Function Tests:  Recent Labs Lab 08/14/13 0900 08/15/13 0603  AST 15 13  ALT 18 14  ALKPHOS 75 58  BILITOT 0.4 0.7  PROT 6.8 5.9*  ALBUMIN 3.3* 2.9*   No results found for this basename: LIPASE, AMYLASE,  in the last 168 hours No results found for this basename: AMMONIA,  in the last 168 hours CBC:  Recent Labs Lab 08/14/13 0900 08/14/13 1248 08/14/13 2010 08/15/13 0603  WBC 9.3  --   --  9.7  HGB 10.4* 9.7* 9.3* 9.0*  HCT 31.2* 29.4* 27.6* 27.1*  MCV 83.9  --   --  83.6  PLT 297  --   --  261   Cardiac Enzymes: No results found for this basename: CKTOTAL, CKMB, CKMBINDEX, TROPONINI,  in the last 168 hours BNP: BNP (last 3 results) No results found for this basename: PROBNP,  in the last 8760 hours CBG: No results found for this basename: GLUCAP,  in the last 168 hours     Signed:  Giulianna Rocha S  [facility] 08/15/2013, 1:21 PM

## 2013-08-15 NOTE — H&P (View-Only) (Signed)
Consultation  Referring Provider: [facility]     Primary Care Physician:  [facility] Primary Gastroenterologist: Dr. Wynetta Emery [facility])        Reason for Consultation: GI Bleed             HPI:   Lance Rollins is a 68 y.o. male admitted through ED this am with black stools. He has taken Mobic for a year. On New Year's he began passing black stool. PCP started him on Prilosec but then yesterday black stools contained some bright red blood, patient came to ED. No abdominal pain. No nausea. No other GI complaints.  Past Medical History  Diagnosis Date  . Hypercholesteremia   . Prostate cancer    PSH: Right inguinal hernia repair.   History  Substance Use Topics  . Smoking status: Never Smoker   . Smokeless tobacco: Not on file  . Alcohol Use: Yes     Comment: occ    Prior to Admission medications   Medication Sig Start Date End Date Taking? Authorizing Provider  acetaminophen (TYLENOL) 500 MG tablet Take 1,000 mg by mouth every 8 (eight) hours as needed for mild pain.   Yes Historical Provider, MD  meloxicam (MOBIC) 15 MG tablet Take 15 mg by mouth daily.   Yes Historical Provider, MD  omeprazole (PRILOSEC) 40 MG capsule Take 40 mg by mouth daily. For 30 days starting on 08/12/13   Yes Historical Provider, MD  simvastatin (ZOCOR) 40 MG tablet Take 40 mg by mouth every evening.   Yes Historical Provider, MD    No current facility-administered medications for this encounter.   Current Outpatient Prescriptions  Medication Sig Dispense Refill  . acetaminophen (TYLENOL) 500 MG tablet Take 1,000 mg by mouth every 8 (eight) hours as needed for mild pain.      . meloxicam (MOBIC) 15 MG tablet Take 15 mg by mouth daily.      Marland Kitchen omeprazole (PRILOSEC) 40 MG capsule Take 40 mg by mouth daily. For 30 days starting on 08/12/13      . simvastatin (ZOCOR) 40 MG tablet Take 40 mg by mouth every evening.        Allergies as of 08/14/2013  . (No Known Allergies)   Review of  Systems:    All systems reviewed and negative except where noted in HPI.   Physical Exam:  Vital signs in last 24 hours: Temp:  [98.2 F (36.8 C)] 98.2 F (36.8 C) (01/05 0827) Pulse Rate:  [77-117] 90 (01/05 1134) Resp:  [18-19] 18 (01/05 1134) BP: (126-149)/(56-89) 126/56 mmHg (01/05 1134) SpO2:  [97 %-100 %] 97 % (01/05 1134) Weight:  [240 lb (108.863 kg)] 240 lb (108.863 kg) (01/05 0827)   General:   Pleasant  black male in NAD Head:  Normocephalic and atraumatic. Eyes:   No icterus.   Conjunctiva pink. Ears:  Normal auditory acuity. Neck:  Supple; no masses felt Lungs:  Respirations even and unlabored. Lungs clear to auscultation bilaterally.   No wheezes, crackles, or rhonchi.  Heart:  Regular rate and rhythm Abdomen:  Soft, nondistended, nontender. Normal bowel sounds. No appreciable masses or hepatomegaly.  Rectal:  Scant melenic stool in vault.  Msk:  Symmetrical without gross deformities.  Extremities:  Without edema. Neurologic:  Alert and  oriented x4;  grossly normal neurologically. Skin:  Intact without significant lesions or rashes. Cervical Nodes:  No significant cervical adenopathy. Psych:  Alert and cooperative. Normal affect.  LAB RESULTS:  Recent Labs  08/14/13 0900  WBC 9.3  HGB 10.4*  HCT 31.2*  PLT 297   BMET  Recent Labs  08/14/13 0900  NA 140  K 4.3  CL 104  CO2 23  GLUCOSE 119*  BUN 30*  CREATININE 1.05  CALCIUM 9.0   LFT  Recent Labs  08/14/13 0900  PROT 6.8  ALBUMIN 3.3*  AST 15  ALT 18  ALKPHOS 75  BILITOT 0.4    PREVIOUS ENDOSCOPIES:            Remote colonoscopy ([facility])   Impression / Plan:   82, 68 year old male with hemodynamically stable upper GI bleed. Rule out erosive disease / PUD in setting of mobic. Patient will be scheduled for EGD tomorrow. He can have clears today. Continue IV PPI.   2. Anemia of acute blood loss. Hgb 9.7, down from baseline of mid 13. Monitor CBC, transfuse if necessary.    Thanks  LOS: 0 days   Tye Savoy  08/14/2013, 12:00 PM    ________________________________________________________________________  [facility] MD note:  I personally examined the patient, reviewed the data and agree with the assessment and plan described above.  Likely UGI bleeding.  Planning on EGD tomorrow.  If no clear cause of his bleeding then will probably proceed with colonoscopy as IP.   Owens Loffler, MD [facility] Pager [phone]

## 2013-08-16 ENCOUNTER — Encounter (HOSPITAL_COMMUNITY): Payer: Self-pay | Admitting: Gastroenterology

## 2013-08-30 ENCOUNTER — Telehealth: Payer: Self-pay | Admitting: Gastroenterology

## 2013-08-30 NOTE — Telephone Encounter (Signed)
Pt wanted pain meds for his knee and back. He was advised to call his PCP.  Pt agreed

## 2013-09-13 ENCOUNTER — Encounter: Payer: Self-pay | Admitting: Gastroenterology

## 2013-09-13 ENCOUNTER — Ambulatory Visit (INDEPENDENT_AMBULATORY_CARE_PROVIDER_SITE_OTHER): Payer: Medicare Other | Admitting: Gastroenterology

## 2013-09-13 ENCOUNTER — Other Ambulatory Visit (INDEPENDENT_AMBULATORY_CARE_PROVIDER_SITE_OTHER): Payer: Medicare Other

## 2013-09-13 VITALS — BP 140/70 | HR 74 | Ht 67.0 in | Wt 240.0 lb

## 2013-09-13 DIAGNOSIS — K2970 Gastritis, unspecified, without bleeding: Secondary | ICD-10-CM

## 2013-09-13 DIAGNOSIS — K2990 Gastroduodenitis, unspecified, without bleeding: Secondary | ICD-10-CM

## 2013-09-13 DIAGNOSIS — Z1211 Encounter for screening for malignant neoplasm of colon: Secondary | ICD-10-CM

## 2013-09-13 LAB — CBC WITH DIFFERENTIAL/PLATELET
Basophils Absolute: 0.2 10*3/uL — ABNORMAL HIGH (ref 0.0–0.1)
Basophils Relative: 2.1 % (ref 0.0–3.0)
EOS ABS: 0.2 10*3/uL (ref 0.0–0.7)
EOS PCT: 3.1 % (ref 0.0–5.0)
HCT: 31.4 % — ABNORMAL LOW (ref 39.0–52.0)
Hemoglobin: 9.8 g/dL — ABNORMAL LOW (ref 13.0–17.0)
LYMPHS ABS: 2.6 10*3/uL (ref 0.7–4.0)
Lymphocytes Relative: 34.4 % (ref 12.0–46.0)
MCHC: 31 g/dL (ref 30.0–36.0)
MCV: 80.1 fl (ref 78.0–100.0)
MONO ABS: 0.8 10*3/uL (ref 0.1–1.0)
Monocytes Relative: 10.8 % (ref 3.0–12.0)
Neutro Abs: 3.7 10*3/uL (ref 1.4–7.7)
Neutrophils Relative %: 49.6 % (ref 43.0–77.0)
PLATELETS: 501 10*3/uL — AB (ref 150.0–400.0)
RBC: 3.93 Mil/uL — AB (ref 4.22–5.81)
RDW: 17.4 % — ABNORMAL HIGH (ref 11.5–14.6)
WBC: 7.4 10*3/uL (ref 4.5–10.5)

## 2013-09-13 MED ORDER — PANTOPRAZOLE SODIUM 40 MG PO TBEC: 40.0000 mg | DELAYED_RELEASE_TABLET | Freq: Every day | ORAL | Status: DC

## 2013-09-13 MED ORDER — MOVIPREP 100 G PO SOLR: 1.0000 | Freq: Once | ORAL | Status: DC

## 2013-09-13 MED ORDER — TRAMADOL HCL 50 MG PO TABS: 50.0000 mg | ORAL_TABLET | Freq: Two times a day (BID) | ORAL | Status: DC | PRN

## 2013-09-13 NOTE — Progress Notes (Signed)
Review of pertinent gastrointestinal problems: 1. Melena, anemia: EGD, Ardis Hughs 08/2013: There was mild, diffuse non-specific gastritis. Biopsies were taken from antrum and sent to pathology. There was a fleshy, 13mm polyp along greater curve of gastric body. This polyp bled with minimal trauma from scope and it was removed with snare/cautery. The examination was otherwise normal.  Had been taking meloxicam daily. [employer] showed no H. Pylori, polyp was HP.  Was put on BID PP.   HPI: This is a  very pleasant 68 year old man whom I last saw about one month ago when he was hospitalized for upper GI bleed. See the summary above.  He thinks popcorn and hot sauce caused the bleeding.  Tylenol doesn't work as well.  Was taking meloxicam (about once per day).  Taking protonix one pill twice daily.  He had a colonoscopy in 2004 with Dr. Wynetta Emery, tells me it was completely normal. Colon cancer does not run in his family.   Past Medical History  Diagnosis Date  . Hypercholesteremia   . Prostate cancer 2011  . Bleeding per rectum 08/2013    "black and bright red" (08/14/2013)  . Arthritis   . Colon polyps   . Obesity     Past Surgical History  Procedure Laterality Date  . Inguinal hernia repair Bilateral ~ 2004  . Robot assisted laparoscopic radical prostatectomy  08/2009    [name] 08/14/2009 (08/14/2013)  . Esophagogastroduodenoscopy N/A 08/15/2013    Procedure: ESOPHAGOGASTRODUODENOSCOPY (EGD);  Surgeon: Milus Banister, MD;  Location: [facility];  Service: Endoscopy;  Laterality: N/A;    Current Outpatient Prescriptions  Medication Sig Dispense Refill  . acetaminophen (TYLENOL) 500 MG tablet Take 1,000 mg by mouth every 8 (eight) hours as needed for mild pain.      . pantoprazole (PROTONIX) 40 MG tablet Take 1 tablet (40 mg total) by mouth 2 (two) times daily before a meal.  60 tablet  2  . simvastatin (ZOCOR) 40 MG tablet Take 40 mg by mouth every evening.       No current  facility-administered medications for this visit.    Allergies as of 09/13/2013  . (No Known Allergies)    Family History  Problem Relation Age of Onset  . Diabetes Sister   . Diabetes Mother   . Colon cancer Neg Hx     History   Social History  . Marital Status: Married    Spouse Name: N/A    Number of Children: 29  . Years of Education: N/A   Occupational History  . retired    Social History Main Topics  . Smoking status: Never Smoker   . Smokeless tobacco: Never Used  . Alcohol Use: Yes     Comment: 08/14/2013 "might have a drink q other holiday"  . Drug Use: No  . Sexual Activity: Not Currently   Other Topics Concern  . Not on file   Social History Narrative  . No narrative on file      Physical Exam: BP 140/70  Pulse 74  Ht 5\' 7"  (1.702 m)  Wt 240 lb (108.863 kg)  BMI 37.58 kg/m2 Constitutional: generally well-appearing Psychiatric: alert and oriented x3 Abdomen: soft, nontender, nondistended, no obvious ascites, no peritoneal signs, normal bowel sounds     Assessment and plan: 68 y.o. male with gastritis, hyperplastic polyp that may have caused melena, resolved, routine risk for colon cancer  First I think we can cut down his proton pump inhibitor once daily. He asked for better pain  control and I have given him a prescription for Ultram 50 mg twice daily. He can use that along with the Tylenol. He is due for colon cancer screening with colonoscopy and we will set that up at his soonest convenience. She'll also get an EGD to document if his blood counts have risen since his acute illness one month ago.

## 2013-09-13 NOTE — Patient Instructions (Addendum)
You will have labs checked today in the basement lab.  Please head down after you check out with the front desk  (cbc) Decrease you antiacid medicine to once daily (protonix) Try to continue to avoid NSAID type meds such as meloxicam. New prescription for ultram was called in today. You will be set up for a colonoscopy for routine screening. We will get records sent from your previous gastroenterologist Dr. Earle Gell, I believe 2004 colonoscopy) for review.  This will include any endoscopic (colonoscopy or upper endoscopy) procedures and any associated pathology reports.

## 2013-09-14 ENCOUNTER — Encounter: Payer: Self-pay | Admitting: Gastroenterology

## 2013-10-02 ENCOUNTER — Encounter: Payer: Self-pay | Admitting: Gastroenterology

## 2013-10-02 ENCOUNTER — Ambulatory Visit (AMBULATORY_SURGERY_CENTER): Payer: Medicare Other | Admitting: Gastroenterology

## 2013-10-02 VITALS — BP 113/64 | HR 70 | Temp 98.5°F | Resp 34 | Ht 67.0 in | Wt 240.0 lb

## 2013-10-02 DIAGNOSIS — D126 Benign neoplasm of colon, unspecified: Secondary | ICD-10-CM

## 2013-10-02 DIAGNOSIS — Z1211 Encounter for screening for malignant neoplasm of colon: Secondary | ICD-10-CM

## 2013-10-02 DIAGNOSIS — K5730 Diverticulosis of large intestine without perforation or abscess without bleeding: Secondary | ICD-10-CM

## 2013-10-02 MED ORDER — SODIUM CHLORIDE 0.9 % IV SOLN: 500.0000 mL | INTRAVENOUS | Status: DC

## 2013-10-02 NOTE — Patient Instructions (Signed)
YOU HAD AN ENDOSCOPIC PROCEDURE TODAY AT THE [facility]: Refer to the procedure report that was given to you for any specific questions about what was found during the examination.  If the procedure report does not answer your questions, please call your gastroenterologist to clarify.  If you requested that your care partner not be given the details of your procedure findings, then the procedure report has been included in a sealed envelope for you to review at your convenience later.  YOU SHOULD EXPECT: Some feelings of bloating in the abdomen. Passage of more gas than usual.  Walking can help get rid of the air that was put into your GI tract during the procedure and reduce the bloating. If you had a lower endoscopy (such as a colonoscopy or flexible sigmoidoscopy) you may notice spotting of blood in your stool or on the toilet paper. If you underwent a bowel prep for your procedure, then you may not have a normal bowel movement for a few days.  DIET: Your first meal following the procedure should be a light meal and then it is ok to progress to your normal diet.  A half-sandwich or bowl of soup is an example of a good first meal.  Heavy or fried foods are harder to digest and may make you feel nauseous or bloated.  Likewise meals heavy in dairy and vegetables can cause extra gas to form and this can also increase the bloating.  Drink plenty of fluids but you should avoid alcoholic beverages for 24 hours.  ACTIVITY: Your care partner should take you home directly after the procedure.  You should plan to take it easy, moving slowly for the rest of the day.  You can resume normal activity the day after the procedure however you should NOT DRIVE or use heavy machinery for 24 hours (because of the sedation medicines used during the test).    SYMPTOMS TO REPORT IMMEDIATELY: A gastroenterologist can be reached at any hour.  During normal business hours, 8:30 AM to 5:00 PM Monday through Friday,  call [phone].  After hours and on weekends, please call the GI answering service at [phone] who will take a message and have the physician on call contact you.   Following lower endoscopy (colonoscopy or flexible sigmoidoscopy):  Excessive amounts of blood in the stool  Significant tenderness or worsening of abdominal pains  Swelling of the abdomen that is new, acute  Fever of 100F or higher  FOLLOW UP: If any biopsies were taken you will be contacted by phone or by letter within the next 1-3 weeks.  Call your gastroenterologist if you have not heard about the biopsies in 3 weeks.  Our staff will call the home number listed on your records the next business day following your procedure to check on you and address any questions or concerns that you may have at that time regarding the information given to you following your procedure. This is a courtesy call and so if there is no answer at the home number and we have not heard from you through the emergency physician on call, we will assume that you have returned to your regular daily activities without incident.  SIGNATURES/CONFIDENTIALITY: You and/or your care partner have signed paperwork which will be entered into your electronic medical record.  These signatures attest to the fact that that the information above on your After Visit Summary has been reviewed and is understood.  Full responsibility of the confidentiality of this   discharge information lies with you and/or your care-partner.  Please await pathology results  Please read over handouts about polyps, diverticulosis, and high fiber diets  Please continue your normal medications

## 2013-10-02 NOTE — Progress Notes (Signed)
A/ox3 pleased with MAC, report to Kristin RN 

## 2013-10-02 NOTE — Progress Notes (Signed)
Called to room to assist during endoscopic procedure.  Patient ID and intended procedure confirmed with present staff. Received instructions for my participation in the procedure from the performing physician.  

## 2013-10-02 NOTE — Op Note (Signed)
[place]  [address]. [place], [address]   COLONOSCOPY PROCEDURE REPORT  PATIENT: Lance Rollins, Lance Rollins  MR#: [id number] BIRTHDATE: 08/17/1945 , 89  yrs. old GENDER: Male ENDOSCOPIST: Milus Banister, MD PROCEDURE DATE:  10/02/2013 PROCEDURE:   Colonoscopy with biopsy and snare polypectomy First Screening Colonoscopy - Avg.  risk and is 50 yrs.  old or older - No.  Prior Negative Screening - Now for repeat screening. N/A  History of Adenoma - Now for follow-up colonoscopy & has been > or = to 3 yrs.  N/A  Polyps Removed Today? Yes. ASA CLASS:   Class II INDICATIONS:average risk screening (colonoscopy 2004 Dr. Wynetta Emery showed no polyps). MEDICATIONS: propofol (Diprivan) 250mg  IV and MAC sedation, administered by CRNA  DESCRIPTION OF PROCEDURE:   After the risks benefits and alternatives of the procedure were thoroughly explained, informed consent was obtained.  A digital rectal exam revealed no abnormalities of the rectum.   The LB [id number]  endoscope was introduced through the anus and advanced to the cecum, which was identified by both the appendix and ileocecal valve. No adverse events experienced.   The quality of the prep was excellent.  The instrument was then slowly withdrawn as the colon was fully examined.  COLON FINDINGS: Three polyps were found, removed and sent to pathology.  These were all sessile, ranged in size from 72mm to 33mm. The smallest, in cecum, was removed with forceps.  The others, located in transverse and descending segments, were removed with cold snare.  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  The examination was otherwise normal.  Retroflexed views revealed no abnormalities. The time to cecum=2 minutes 52 seconds.  Withdrawal time=14 minutes 10 seconds.  The scope was withdrawn and the procedure completed. COMPLICATIONS: There were no complications.  ENDOSCOPIC IMPRESSION: Three polyps were found, removed and sent to  pathology. 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 The examination was otherwise normal.  RECOMMENDATIONS: If the polyp(s) removed today are proven to be adenomatous (pre-cancerous) polyps, you will need a repeat colonoscopy in 3-5 years.  Otherwise you should continue to follow colorectal cancer screening guidelines for "routine risk" patients with colonoscopy in 10 years.  You will receive a letter within 1-2 weeks with the results of your biopsy as well as final recommendations.  Please call my office if you have not received a letter after 3 weeks.   eSigned:  Milus Banister, MD 10/02/2013 2:07 PM

## 2013-10-03 ENCOUNTER — Telehealth: Payer: Self-pay | Admitting: *Deleted

## 2013-10-03 NOTE — Telephone Encounter (Signed)
  Follow up Call-  Call back number 10/02/2013  Post procedure Call Back phone  # [phone] or [phone]  Permission to leave phone message Yes     Patient questions:  Do you have a fever, pain , or abdominal swelling? no Pain Score  0 *  Have you tolerated food without any problems? yes  Have you been able to return to your normal activities? yes  Do you have any questions about your discharge instructions: Diet   no Medications  no Follow up visit  no  Do you have questions or concerns about your Care? no  Actions: * If pain score is 4 or above: No action needed, pain <4.

## 2013-10-06 ENCOUNTER — Encounter: Payer: Self-pay | Admitting: Gastroenterology

## 2013-10-27 ENCOUNTER — Other Ambulatory Visit (HOSPITAL_COMMUNITY): Payer: Self-pay | Admitting: Orthopaedic Surgery

## 2013-10-27 ENCOUNTER — Other Ambulatory Visit (HOSPITAL_COMMUNITY): Payer: Self-pay

## 2013-11-07 NOTE — Pre-Procedure Instructions (Signed)
Lance Rollins  11/07/2013   Your procedure is scheduled on:  Tuesday, April 7th  Report to Admitting at 1230 PM.  Call this number if you have problems the morning of surgery: [phone]   Remember:   Do not eat food or drink liquids after midnight.   Take these medicines the morning of surgery with A SIP OF WATER: protonix, pain medication if needed   Do not wear jewelry.  Do not wear lotions, powders, or perfumes. You may wear deodorant.  Do not shave 48 hours prior to surgery. Men may shave face and neck.  Do not bring valuables to the hospital.  [facility] is not responsible  for any belongings or valuables.               Contacts, dentures or bridgework may not be worn into surgery.  Leave suitcase in the car. After surgery it may be brought to your room.  For patients admitted to the hospital, discharge time is determined by your  treatment team.  Please read over the following fact sheets that you were given: Pain Booklet, Coughing and Deep Breathing, Blood Transfusion Information, MRSA Information and Surgical Site Infection Prevention  [facility] - Preparing for Surgery  Before surgery, you can play an important role.  Because skin is not sterile, your skin needs to be as free of germs as possible.  You can reduce the number of germs on you skin by washing with CHG (chlorahexidine gluconate) soap before surgery.  CHG is an antiseptic cleaner which kills germs and bonds with the skin to continue killing germs even after washing.  Please DO NOT use if you have an allergy to CHG or antibacterial soaps.  If your skin becomes reddened/irritated stop using the CHG and inform your nurse when you arrive at Short Stay.  Do not shave (including legs and underarms) for at least 48 hours prior to the first CHG shower.  You may shave your face.  Please follow these instructions carefully:   1.  Shower with CHG Soap the night before surgery and the morning of Surgery.  2.  If you  choose to wash your hair, wash your hair first as usual with your normal shampoo.  3.  After you shampoo, rinse your hair and body thoroughly to remove the shampoo.  4.  Use CHG as you would any other liquid soap.  You can apply CHG directly to the skin and wash gently with scrungie or a clean washcloth.  5.  Apply the CHG Soap to your body ONLY FROM THE NECK DOWN.  Do not use on open wounds or open sores.  Avoid contact with your eyes, ears, mouth and genitals (private parts).  Wash genitals (private parts) with your normal soap.  6.  Wash thoroughly, paying special attention to the area where your surgery will be performed.  7.  Thoroughly rinse your body with warm water from the neck down.  8.  DO NOT shower/wash with your normal soap after using and rinsing off the CHG Soap.  9.  Pat yourself dry with a clean towel.            10.  Wear clean pajamas.            11.  Place clean sheets on your bed the night of your first shower and do not sleep with pets.  Day of Surgery  Do not apply any lotions/deoderants the morning of surgery.  Please wear clean clothes to  the hospital/surgery center.

## 2013-11-08 ENCOUNTER — Encounter (HOSPITAL_COMMUNITY): Payer: Self-pay

## 2013-11-08 ENCOUNTER — Encounter (HOSPITAL_COMMUNITY)
Admission: RE | Admit: 2013-11-08 | Discharge: 2013-11-08 | Disposition: A | Discharge status: Home / Self Care | Payer: Medicare Other | Source: Ambulatory Visit | Attending: Orthopaedic Surgery | Admitting: Orthopaedic Surgery

## 2013-11-08 DIAGNOSIS — Z01818 Encounter for other preprocedural examination: Secondary | ICD-10-CM | POA: Insufficient documentation

## 2013-11-08 LAB — SURGICAL PCR SCREEN
MRSA, PCR: NEGATIVE
STAPHYLOCOCCUS AUREUS: NEGATIVE

## 2013-11-08 LAB — BASIC METABOLIC PANEL
BUN: 25 mg/dL — AB (ref 6–23)
CHLORIDE: 101 meq/L (ref 96–112)
CO2: 26 meq/L (ref 19–32)
Calcium: 9.7 mg/dL (ref 8.4–10.5)
Creatinine, Ser: 0.9 mg/dL (ref 0.50–1.35)
GFR calc non Af Amer: 87 mL/min — ABNORMAL LOW (ref >90)
Glucose, Bld: 93 mg/dL (ref 70–99)
Potassium: 4.3 mEq/L (ref 3.7–5.3)
Sodium: 139 mEq/L (ref 137–147)

## 2013-11-08 LAB — URINALYSIS, ROUTINE W REFLEX MICROSCOPIC
Bilirubin Urine: NEGATIVE
GLUCOSE, UA: NEGATIVE mg/dL
HGB URINE DIPSTICK: NEGATIVE
Ketones, ur: NEGATIVE mg/dL
Leukocytes, UA: NEGATIVE
Nitrite: NEGATIVE
PROTEIN: NEGATIVE mg/dL
SPECIFIC GRAVITY, URINE: 1.03 (ref 1.005–1.030)
Urobilinogen, UA: 0.2 mg/dL (ref 0.0–1.0)
pH: 5.5 (ref 5.0–8.0)

## 2013-11-08 LAB — PROTIME-INR
INR: 1.03 (ref 0.00–1.49)
PROTHROMBIN TIME: 13.3 s (ref 11.6–15.2)

## 2013-11-08 LAB — CBC
HEMATOCRIT: 32.2 % — AB (ref 39.0–52.0)
Hemoglobin: 9.9 g/dL — ABNORMAL LOW (ref 13.0–17.0)
MCH: 21.5 pg — ABNORMAL LOW (ref 26.0–34.0)
MCHC: 30.7 g/dL (ref 30.0–36.0)
MCV: 70 fL — AB (ref 78.0–100.0)
Platelets: 470 10*3/uL — ABNORMAL HIGH (ref 150–400)
RBC: 4.6 MIL/uL (ref 4.22–5.81)
RDW: 18.3 % — ABNORMAL HIGH (ref 11.5–15.5)
WBC: 7.2 10*3/uL (ref 4.0–10.5)

## 2013-11-08 LAB — APTT: aPTT: 28 seconds (ref 24–37)

## 2013-11-13 MED ORDER — CEFAZOLIN SODIUM-DEXTROSE 2-3 GM-% IV SOLR
2.0000 g | INTRAVENOUS | Status: AC
  Administered 2013-11-14: 2 g via INTRAVENOUS
  Filled 2013-11-13: qty 50

## 2013-11-14 ENCOUNTER — Encounter (HOSPITAL_COMMUNITY)
Admission: RE | Disposition: A | Discharge status: Home Health | Payer: Self-pay | Source: Ambulatory Visit | Attending: Orthopaedic Surgery

## 2013-11-14 ENCOUNTER — Inpatient Hospital Stay (HOSPITAL_COMMUNITY): Payer: Medicare Other

## 2013-11-14 ENCOUNTER — Encounter (HOSPITAL_COMMUNITY): Payer: Self-pay | Admitting: Certified Registered Nurse Anesthetist

## 2013-11-14 ENCOUNTER — Inpatient Hospital Stay (HOSPITAL_COMMUNITY)
Admission: RE | Admit: 2013-11-14 | Discharge: 2013-11-16 | DRG: 470 | Disposition: A | Discharge status: Home Health | Payer: Medicare Other | Source: Ambulatory Visit | Attending: Orthopaedic Surgery | Admitting: Orthopaedic Surgery

## 2013-11-14 ENCOUNTER — Encounter (HOSPITAL_COMMUNITY): Payer: Medicare Other | Admitting: Certified Registered Nurse Anesthetist

## 2013-11-14 ENCOUNTER — Inpatient Hospital Stay (HOSPITAL_COMMUNITY): Payer: Medicare Other | Admitting: Certified Registered Nurse Anesthetist

## 2013-11-14 DIAGNOSIS — D62 Acute posthemorrhagic anemia: Secondary | ICD-10-CM | POA: Diagnosis not present

## 2013-11-14 DIAGNOSIS — E669 Obesity, unspecified: Secondary | ICD-10-CM | POA: Diagnosis present

## 2013-11-14 DIAGNOSIS — Z6837 Body mass index (BMI) 37.0-37.9, adult: Secondary | ICD-10-CM

## 2013-11-14 DIAGNOSIS — K2970 Gastritis, unspecified, without bleeding: Secondary | ICD-10-CM | POA: Diagnosis present

## 2013-11-14 DIAGNOSIS — M169 Osteoarthritis of hip, unspecified: Principal | ICD-10-CM | POA: Diagnosis present

## 2013-11-14 DIAGNOSIS — M1610 Unilateral primary osteoarthritis, unspecified hip: Principal | ICD-10-CM | POA: Diagnosis present

## 2013-11-14 DIAGNOSIS — K2990 Gastroduodenitis, unspecified, without bleeding: Secondary | ICD-10-CM

## 2013-11-14 DIAGNOSIS — Z96649 Presence of unspecified artificial hip joint: Secondary | ICD-10-CM

## 2013-11-14 DIAGNOSIS — M1612 Unilateral primary osteoarthritis, left hip: Secondary | ICD-10-CM

## 2013-11-14 DIAGNOSIS — Z833 Family history of diabetes mellitus: Secondary | ICD-10-CM

## 2013-11-14 DIAGNOSIS — E7800 Pure hypercholesterolemia, unspecified: Secondary | ICD-10-CM | POA: Diagnosis present

## 2013-11-14 DIAGNOSIS — Z8546 Personal history of malignant neoplasm of prostate: Secondary | ICD-10-CM

## 2013-11-14 HISTORY — PX: TOTAL HIP ARTHROPLASTY: SHX124

## 2013-11-14 SURGERY — ARTHROPLASTY, HIP, TOTAL, ANTERIOR APPROACH
Anesthesia: General | Site: Hip | Laterality: Left

## 2013-11-14 MED ORDER — NEOSTIGMINE METHYLSULFATE 1 MG/ML IJ SOLN
INTRAMUSCULAR | Status: DC | PRN
  Administered 2013-11-14: 4 mg via INTRAVENOUS

## 2013-11-14 MED ORDER — PROPOFOL 10 MG/ML IV BOLUS
INTRAVENOUS | Status: DC | PRN
  Administered 2013-11-14: 50 mg via INTRAVENOUS
  Administered 2013-11-14: 150 mg via INTRAVENOUS

## 2013-11-14 MED ORDER — LIDOCAINE HCL (CARDIAC) 20 MG/ML IV SOLN
INTRAVENOUS | Status: AC
  Filled 2013-11-14: qty 5

## 2013-11-14 MED ORDER — HYDROMORPHONE HCL PF 1 MG/ML IJ SOLN
1.0000 mg | INTRAMUSCULAR | Status: DC | PRN
  Administered 2013-11-14 – 2013-11-15 (×5): 1 mg via INTRAVENOUS
  Filled 2013-11-14 (×5): qty 1

## 2013-11-14 MED ORDER — 0.9 % SODIUM CHLORIDE (POUR BTL) OPTIME
TOPICAL | Status: DC | PRN
  Administered 2013-11-14: 1000 mL

## 2013-11-14 MED ORDER — OXYCODONE HCL 5 MG/5ML PO SOLN
5.0000 mg | Freq: Once | ORAL | Status: AC | PRN
Start: 2013-11-14 — End: 2013-11-14

## 2013-11-14 MED ORDER — STERILE WATER FOR INJECTION IJ SOLN
INTRAMUSCULAR | Status: AC
  Filled 2013-11-14: qty 10

## 2013-11-14 MED ORDER — GLYCOPYRROLATE 0.2 MG/ML IJ SOLN
INTRAMUSCULAR | Status: AC
Start: 2013-11-14 — End: 2013-11-14
  Filled 2013-11-14: qty 4

## 2013-11-14 MED ORDER — METHOCARBAMOL 500 MG PO TABS
ORAL_TABLET | ORAL | Status: AC
  Filled 2013-11-14: qty 1

## 2013-11-14 MED ORDER — EPHEDRINE SULFATE 50 MG/ML IJ SOLN
INTRAMUSCULAR | Status: AC
  Filled 2013-11-14: qty 1

## 2013-11-14 MED ORDER — ASPIRIN EC 325 MG PO TBEC
325.0000 mg | DELAYED_RELEASE_TABLET | Freq: Two times a day (BID) | ORAL | Status: DC
  Administered 2013-11-14 – 2013-11-16 (×5): 325 mg via ORAL
  Filled 2013-11-14 (×6): qty 1

## 2013-11-14 MED ORDER — HYDROMORPHONE HCL PF 1 MG/ML IJ SOLN
0.2500 mg | INTRAMUSCULAR | Status: DC | PRN
  Administered 2013-11-14 (×3): 0.5 mg via INTRAVENOUS

## 2013-11-14 MED ORDER — OXYCODONE HCL 5 MG PO TABS
5.0000 mg | ORAL_TABLET | ORAL | Status: DC | PRN
  Administered 2013-11-15 (×2): 5 mg via ORAL
  Administered 2013-11-15 – 2013-11-16 (×7): 10 mg via ORAL
  Filled 2013-11-14: qty 2
  Filled 2013-11-14: qty 1
  Filled 2013-11-14 (×4): qty 2
  Filled 2013-11-14: qty 1
  Filled 2013-11-14 (×2): qty 2

## 2013-11-14 MED ORDER — FENTANYL CITRATE 0.05 MG/ML IJ SOLN
INTRAMUSCULAR | Status: DC | PRN
  Administered 2013-11-14: 50 ug via INTRAVENOUS
  Administered 2013-11-14 (×2): 100 ug via INTRAVENOUS
  Administered 2013-11-14 (×2): 50 ug via INTRAVENOUS

## 2013-11-14 MED ORDER — METOCLOPRAMIDE HCL 5 MG PO TABS: 5.0000 mg | ORAL_TABLET | Freq: Three times a day (TID) | ORAL | Status: DC | PRN

## 2013-11-14 MED ORDER — METHOCARBAMOL 500 MG PO TABS
500.0000 mg | ORAL_TABLET | Freq: Four times a day (QID) | ORAL | Status: DC | PRN
  Administered 2013-11-14 – 2013-11-16 (×2): 500 mg via ORAL
  Filled 2013-11-14 (×2): qty 1

## 2013-11-14 MED ORDER — LACTATED RINGERS IV SOLN
INTRAVENOUS | Status: DC | PRN
  Administered 2013-11-14 (×2): via INTRAVENOUS

## 2013-11-14 MED ORDER — DIPHENHYDRAMINE HCL 12.5 MG/5ML PO ELIX: 12.5000 mg | ORAL_SOLUTION | ORAL | Status: DC | PRN

## 2013-11-14 MED ORDER — SODIUM CHLORIDE 0.9 % IR SOLN
Status: DC | PRN
  Administered 2013-11-14: 1000 mL

## 2013-11-14 MED ORDER — ALUM & MAG HYDROXIDE-SIMETH 200-200-20 MG/5ML PO SUSP: 30.0000 mL | ORAL | Status: DC | PRN

## 2013-11-14 MED ORDER — TRANEXAMIC ACID 100 MG/ML IV SOLN
1000.0000 mg | INTRAVENOUS | Status: AC
  Administered 2013-11-14: 1000 mg via INTRAVENOUS
  Filled 2013-11-14: qty 10

## 2013-11-14 MED ORDER — FERROUS SULFATE 325 (65 FE) MG PO TABS
325.0000 mg | ORAL_TABLET | Freq: Three times a day (TID) | ORAL | Status: DC
  Administered 2013-11-14 – 2013-11-16 (×7): 325 mg via ORAL
  Filled 2013-11-14 (×8): qty 1

## 2013-11-14 MED ORDER — OXYCODONE HCL ER 10 MG PO T12A
10.0000 mg | EXTENDED_RELEASE_TABLET | Freq: Two times a day (BID) | ORAL | Status: DC
  Administered 2013-11-14 – 2013-11-16 (×4): 10 mg via ORAL
  Filled 2013-11-14 (×4): qty 1

## 2013-11-14 MED ORDER — FENTANYL CITRATE 0.05 MG/ML IJ SOLN
INTRAMUSCULAR | Status: AC
  Filled 2013-11-14: qty 5

## 2013-11-14 MED ORDER — ONDANSETRON HCL 4 MG/2ML IJ SOLN
4.0000 mg | Freq: Four times a day (QID) | INTRAMUSCULAR | Status: DC | PRN
  Administered 2013-11-15: 4 mg via INTRAVENOUS
  Filled 2013-11-14: qty 2

## 2013-11-14 MED ORDER — PROMETHAZINE HCL 25 MG/ML IJ SOLN: 6.2500 mg | INTRAMUSCULAR | Status: DC | PRN

## 2013-11-14 MED ORDER — METHOCARBAMOL 100 MG/ML IJ SOLN
500.0000 mg | Freq: Four times a day (QID) | INTRAMUSCULAR | Status: DC | PRN
  Filled 2013-11-14: qty 5

## 2013-11-14 MED ORDER — GLYCOPYRROLATE 0.2 MG/ML IJ SOLN
INTRAMUSCULAR | Status: DC | PRN
  Administered 2013-11-14: .6 mg via INTRAVENOUS

## 2013-11-14 MED ORDER — CEFAZOLIN SODIUM-DEXTROSE 2-3 GM-% IV SOLR
INTRAVENOUS | Status: AC
  Filled 2013-11-14: qty 50

## 2013-11-14 MED ORDER — HYDROMORPHONE HCL PF 1 MG/ML IJ SOLN
INTRAMUSCULAR | Status: AC
  Filled 2013-11-14: qty 1

## 2013-11-14 MED ORDER — ROCURONIUM BROMIDE 100 MG/10ML IV SOLN
INTRAVENOUS | Status: DC | PRN
  Administered 2013-11-14: 50 mg via INTRAVENOUS

## 2013-11-14 MED ORDER — MENTHOL 3 MG MT LOZG
1.0000 | LOZENGE | OROMUCOSAL | Status: DC | PRN
  Administered 2013-11-15: 3 mg via ORAL
  Filled 2013-11-14: qty 9

## 2013-11-14 MED ORDER — OXYCODONE HCL 5 MG PO TABS
5.0000 mg | ORAL_TABLET | Freq: Once | ORAL | Status: AC | PRN
  Administered 2013-11-14: 5 mg via ORAL

## 2013-11-14 MED ORDER — ROCURONIUM BROMIDE 50 MG/5ML IV SOLN
INTRAVENOUS | Status: AC
  Filled 2013-11-14: qty 1

## 2013-11-14 MED ORDER — LIDOCAINE HCL (CARDIAC) 20 MG/ML IV SOLN
INTRAVENOUS | Status: DC | PRN
  Administered 2013-11-14: 100 mg via INTRAVENOUS

## 2013-11-14 MED ORDER — EPHEDRINE SULFATE 50 MG/ML IJ SOLN
INTRAMUSCULAR | Status: DC | PRN
  Administered 2013-11-14 (×3): 10 mg via INTRAVENOUS

## 2013-11-14 MED ORDER — PROPOFOL 10 MG/ML IV BOLUS
INTRAVENOUS | Status: AC
  Filled 2013-11-14: qty 20

## 2013-11-14 MED ORDER — SIMVASTATIN 40 MG PO TABS
40.0000 mg | ORAL_TABLET | Freq: Every evening | ORAL | Status: DC
  Administered 2013-11-14 – 2013-11-16 (×3): 40 mg via ORAL
  Filled 2013-11-14 (×3): qty 1

## 2013-11-14 MED ORDER — OXYCODONE HCL 5 MG PO TABS
ORAL_TABLET | ORAL | Status: AC
  Filled 2013-11-14: qty 1

## 2013-11-14 MED ORDER — CEFAZOLIN SODIUM 1-5 GM-% IV SOLN
1.0000 g | Freq: Four times a day (QID) | INTRAVENOUS | Status: AC
  Administered 2013-11-14 (×2): 1 g via INTRAVENOUS
  Filled 2013-11-14 (×3): qty 50

## 2013-11-14 MED ORDER — MIDAZOLAM HCL 5 MG/5ML IJ SOLN
INTRAMUSCULAR | Status: DC | PRN
  Administered 2013-11-14: 2 mg via INTRAVENOUS

## 2013-11-14 MED ORDER — MIDAZOLAM HCL 2 MG/2ML IJ SOLN
INTRAMUSCULAR | Status: AC
  Filled 2013-11-14: qty 2

## 2013-11-14 MED ORDER — ACETAMINOPHEN 325 MG PO TABS
650.0000 mg | ORAL_TABLET | Freq: Four times a day (QID) | ORAL | Status: DC | PRN
Start: 2013-11-14 — End: 2013-11-16

## 2013-11-14 MED ORDER — PHENOL 1.4 % MT LIQD: 1.0000 | OROMUCOSAL | Status: DC | PRN

## 2013-11-14 MED ORDER — LACTATED RINGERS IV SOLN
INTRAVENOUS | Status: DC
  Administered 2013-11-14: 14:00:00 via INTRAVENOUS

## 2013-11-14 MED ORDER — ONDANSETRON HCL 4 MG/2ML IJ SOLN
INTRAMUSCULAR | Status: DC | PRN
  Administered 2013-11-14: 4 mg via INTRAVENOUS

## 2013-11-14 MED ORDER — MIDAZOLAM HCL 2 MG/2ML IJ SOLN: 1.0000 mg | INTRAMUSCULAR | Status: DC | PRN

## 2013-11-14 MED ORDER — POLYETHYLENE GLYCOL 3350 17 G PO PACK
17.0000 g | PACK | Freq: Every day | ORAL | Status: DC | PRN
  Administered 2013-11-16: 17 g via ORAL
  Filled 2013-11-14: qty 1

## 2013-11-14 MED ORDER — METOCLOPRAMIDE HCL 5 MG/ML IJ SOLN: 5.0000 mg | Freq: Three times a day (TID) | INTRAMUSCULAR | Status: DC | PRN

## 2013-11-14 MED ORDER — DOCUSATE SODIUM 100 MG PO CAPS
100.0000 mg | ORAL_CAPSULE | Freq: Two times a day (BID) | ORAL | Status: DC
  Administered 2013-11-14 – 2013-11-16 (×4): 100 mg via ORAL
  Filled 2013-11-14 (×6): qty 1

## 2013-11-14 MED ORDER — ONDANSETRON HCL 4 MG PO TABS
4.0000 mg | ORAL_TABLET | Freq: Four times a day (QID) | ORAL | Status: DC | PRN
Start: 2013-11-14 — End: 2013-11-16

## 2013-11-14 MED ORDER — SODIUM CHLORIDE 0.9 % IV SOLN
INTRAVENOUS | Status: DC
  Administered 2013-11-14: 19:00:00 via INTRAVENOUS

## 2013-11-14 MED ORDER — ACETAMINOPHEN 650 MG RE SUPP: 650.0000 mg | Freq: Four times a day (QID) | RECTAL | Status: DC | PRN

## 2013-11-14 MED ORDER — ONDANSETRON HCL 4 MG/2ML IJ SOLN
INTRAMUSCULAR | Status: AC
  Filled 2013-11-14: qty 2

## 2013-11-14 MED ORDER — FENTANYL CITRATE 0.05 MG/ML IJ SOLN: 50.0000 ug | Freq: Once | INTRAMUSCULAR | Status: DC

## 2013-11-14 MED ORDER — PANTOPRAZOLE SODIUM 40 MG PO TBEC
40.0000 mg | DELAYED_RELEASE_TABLET | Freq: Every day | ORAL | Status: DC
  Administered 2013-11-15 – 2013-11-16 (×2): 40 mg via ORAL
  Filled 2013-11-14 (×2): qty 1

## 2013-11-14 SURGICAL SUPPLY — 56 items
APL SKNCLS STERI-STRIP NONHPOA (GAUZE/BANDAGES/DRESSINGS) ×1
BANDAGE GAUZE ELAST BULKY 4 IN (GAUZE/BANDAGES/DRESSINGS) IMPLANT
BENZOIN TINCTURE PRP APPL 2/3 (GAUZE/BANDAGES/DRESSINGS) ×3 IMPLANT
BLADE SAW SGTL 18X1.27X75 (BLADE) ×2 IMPLANT
BLADE SAW SGTL 18X1.27X75MM (BLADE) ×1
BLADE SURG ROTATE 9660 (MISCELLANEOUS) IMPLANT
CAPT HIP PF COP ×2 IMPLANT
CELLS DAT CNTRL 66122 CELL SVR (MISCELLANEOUS) ×1 IMPLANT
CLOSURE STERI-STRIP 1/2X4 (GAUZE/BANDAGES/DRESSINGS) ×1
CLOSURE WOUND 1/2 X4 (GAUZE/BANDAGES/DRESSINGS) ×2
CLSR STERI-STRIP ANTIMIC 1/2X4 (GAUZE/BANDAGES/DRESSINGS) ×1 IMPLANT
COVER SURGICAL LIGHT HANDLE (MISCELLANEOUS) ×3 IMPLANT
DRAPE C-ARM 42X72 X-RAY (DRAPES) ×3 IMPLANT
DRAPE STERI IOBAN 125X83 (DRAPES) ×3 IMPLANT
DRAPE U-SHAPE 47X51 STRL (DRAPES) ×9 IMPLANT
DRSG AQUACEL AG ADV 3.5X10 (GAUZE/BANDAGES/DRESSINGS) ×3 IMPLANT
DURAPREP 26ML APPLICATOR (WOUND CARE) ×3 IMPLANT
ELECT BLADE 4.0 EZ CLEAN MEGAD (MISCELLANEOUS)
ELECT BLADE 6.5 EXT (BLADE) IMPLANT
ELECT CAUTERY BLADE 6.4 (BLADE) ×3 IMPLANT
ELECT REM PT RETURN 9FT ADLT (ELECTROSURGICAL) ×3
ELECTRODE BLDE 4.0 EZ CLN MEGD (MISCELLANEOUS) IMPLANT
ELECTRODE REM PT RTRN 9FT ADLT (ELECTROSURGICAL) ×1 IMPLANT
FACESHIELD WRAPAROUND (MASK) ×6 IMPLANT
FACESHIELD WRAPAROUND OR TEAM (MASK) ×2 IMPLANT
GLOVE BIOGEL PI IND STRL 8 (GLOVE) ×2 IMPLANT
GLOVE BIOGEL PI INDICATOR 8 (GLOVE) ×4
GLOVE ECLIPSE 8.0 STRL XLNG CF (GLOVE) ×3 IMPLANT
GLOVE ORTHO TXT STRL SZ7.5 (GLOVE) ×6 IMPLANT
GOWN STRL REUS W/ TWL XL LVL3 (GOWN DISPOSABLE) ×2 IMPLANT
GOWN STRL REUS W/TWL XL LVL3 (GOWN DISPOSABLE) ×6
HANDPIECE INTERPULSE COAX TIP (DISPOSABLE) ×3
KIT BASIN OR (CUSTOM PROCEDURE TRAY) ×3 IMPLANT
KIT ROOM TURNOVER OR (KITS) ×3 IMPLANT
MANIFOLD NEPTUNE II (INSTRUMENTS) ×3 IMPLANT
NS IRRIG 1000ML POUR BTL (IV SOLUTION) ×3 IMPLANT
PACK TOTAL JOINT (CUSTOM PROCEDURE TRAY) ×3 IMPLANT
PAD ARMBOARD 7.5X6 YLW CONV (MISCELLANEOUS) ×6 IMPLANT
RETRACTOR WND ALEXIS 18 MED (MISCELLANEOUS) ×1 IMPLANT
RTRCTR WOUND ALEXIS 18CM MED (MISCELLANEOUS) ×3
SET HNDPC FAN SPRY TIP SCT (DISPOSABLE) ×1 IMPLANT
SPONGE LAP 18X18 X RAY DECT (DISPOSABLE) IMPLANT
SPONGE LAP 4X18 X RAY DECT (DISPOSABLE) IMPLANT
STRIP CLOSURE SKIN 1/2X4 (GAUZE/BANDAGES/DRESSINGS) ×4 IMPLANT
SUT ETHIBOND NAB CT1 #1 30IN (SUTURE) ×3 IMPLANT
SUT MNCRL AB 4-0 PS2 18 (SUTURE) ×3 IMPLANT
SUT VIC AB 0 CT1 27 (SUTURE) ×3
SUT VIC AB 0 CT1 27XBRD ANBCTR (SUTURE) ×1 IMPLANT
SUT VIC AB 1 CT1 27 (SUTURE) ×3
SUT VIC AB 1 CT1 27XBRD ANBCTR (SUTURE) ×1 IMPLANT
SUT VIC AB 2-0 CT1 27 (SUTURE) ×3
SUT VIC AB 2-0 CT1 TAPERPNT 27 (SUTURE) ×1 IMPLANT
TOWEL OR 17X24 6PK STRL BLUE (TOWEL DISPOSABLE) ×3 IMPLANT
TOWEL OR 17X26 10 PK STRL BLUE (TOWEL DISPOSABLE) ×3 IMPLANT
TRAY FOLEY CATH 16FRSI W/METER (SET/KITS/TRAYS/PACK) IMPLANT
WATER STERILE IRR 1000ML POUR (IV SOLUTION) ×6 IMPLANT

## 2013-11-14 NOTE — Plan of Care (Signed)
Problem: Consults Goal: Diagnosis- Total Joint Replacement Primary Total Hip Left     

## 2013-11-14 NOTE — Anesthesia Procedure Notes (Signed)
Procedure Name: Intubation Date/Time: 11/14/2013 2:34 PM Performed by: Blair Heys E Pre-anesthesia Checklist: Patient identified, Emergency Drugs available, Suction available and Patient being monitored Patient Re-evaluated:Patient Re-evaluated prior to inductionOxygen Delivery Method: Circle system utilized Preoxygenation: Pre-oxygenation with 100% oxygen Intubation Type: IV induction Ventilation: Mask ventilation without difficulty Laryngoscope Size: Miller and 2 Grade View: Grade I Tube type: Oral Tube size: 7.5 mm Number of attempts: 1 Airway Equipment and Method: Stylet Placement Confirmation: ETT inserted through vocal cords under direct vision,  positive ETCO2 and breath sounds checked- equal and bilateral Secured at: 22 cm Dental Injury: Teeth and Oropharynx as per pre-operative assessment

## 2013-11-14 NOTE — Anesthesia Postprocedure Evaluation (Signed)
  Anesthesia Post-op Note  Patient: Lance Rollins  Procedure(s) Performed: Procedure(s): LEFT TOTAL HIP ARTHROPLASTY ANTERIOR APPROACH (Left)  Patient Location: PACU  Anesthesia Type:General  Level of Consciousness: awake  Airway and Oxygen Therapy: Patient Spontanous Breathing  Post-op Pain: mild  Post-op Assessment: Post-op Vital signs reviewed  Post-op Vital Signs: Reviewed  Complications: No apparent anesthesia complications

## 2013-11-14 NOTE — Brief Op Note (Signed)
11/14/2013  4:39 PM  PATIENT:  Barbaraann Share  68 y.o. male  PRE-OPERATIVE DIAGNOSIS:  Severe osteoarthritis left hip  POST-OPERATIVE DIAGNOSIS:  Severe osteoarthritis left hip  PROCEDURE:  Procedure(s): LEFT TOTAL HIP ARTHROPLASTY ANTERIOR APPROACH (Left)  SURGEON:  Surgeon(s) and Role:    * Mcarthur Rossetti, MD - Primary  PHYSICIAN ASSISTANT: Benita Stabile, PA-C  ANESTHESIA:   general  EBL:  Total I/O In: 1000 [I.V.:1000] Out: 32 [Urine:210; Blood:500]  BLOOD ADMINISTERED:none  DRAINS: none   LOCAL MEDICATIONS USED:  NONE  SPECIMEN:  No Specimen  DISPOSITION OF SPECIMEN:  N/A  COUNTS:  YES  TOURNIQUET:  * No tourniquets in log *  DICTATION: .Other Dictation: Dictation Number [id number]  PLAN OF CARE: Admit to inpatient   PATIENT DISPOSITION:  PACU - hemodynamically stable.   Delay start of Pharmacological VTE agent (>24hrs) due to surgical blood loss or risk of bleeding: no

## 2013-11-14 NOTE — Anesthesia Preprocedure Evaluation (Addendum)
Anesthesia Evaluation  Patient identified by MRN, date of birth, ID band Patient awake    Reviewed: Allergy & Precautions, H&P , NPO status , Patient's Chart, lab work & pertinent test results  Airway Mallampati: II TM Distance: <3 FB Neck ROM: Full    Dental  (+) Dental Advisory Given, Missing,    Pulmonary  breath sounds clear to auscultation        Cardiovascular Rhythm:Regular Rate:Normal     Neuro/Psych    GI/Hepatic   Endo/Other    Renal/GU      Musculoskeletal  (+) Arthritis -,   Abdominal (+) + obese,   Peds  Hematology   Anesthesia Other Findings H/o prostate ca  Reproductive/Obstetrics                         Anesthesia Physical Anesthesia Plan  ASA: III  Anesthesia Plan: General   Post-op Pain Management:    Induction: Intravenous  Airway Management Planned: Oral ETT  Additional Equipment:   Intra-op Plan:   Post-operative Plan: Extubation in OR  Informed Consent: I have reviewed the patients History and Physical, chart, labs and discussed the procedure including the risks, benefits and alternatives for the proposed anesthesia with the patient or authorized representative who has indicated his/her understanding and acceptance.   Dental advisory given  Plan Discussed with: CRNA and Surgeon  Anesthesia Plan Comments:       Anesthesia Quick Evaluation

## 2013-11-14 NOTE — H&P (Signed)
TOTAL HIP ADMISSION H&P  Patient is admitted for left total hip arthroplasty.  Subjective:  Chief Complaint: left hip pain  HPI: Lance Rollins, 68 y.o. male, has a history of pain and functional disability in the left hip(s) due to arthritis and patient has failed non-surgical conservative treatments for greater than 12 weeks to include NSAID's and/or analgesics, corticosteriod injections, use of assistive devices, weight reduction as appropriate and activity modification.  Onset of symptoms was gradual starting 10 years ago with gradually worsening course since that time.The patient noted no past surgery on the left hip(s).  Patient currently rates pain in the left hip at 10 out of 10 with activity. Patient has night pain, worsening of pain with activity and weight bearing, trendelenberg gait, pain that interfers with activities of daily living, pain with passive range of motion and crepitus. Patient has evidence of subchondral cysts, subchondral sclerosis, periarticular osteophytes and joint space narrowing by imaging studies. This condition presents safety issues increasing the risk of falls.  There is no current active infection.  Patient Active Problem List   Diagnosis Date Noted  . Arthritis of left hip 11/14/2013  . Unspecified gastritis and gastroduodenitis without mention of hemorrhage 08/15/2013  . Gastric polyp 08/15/2013   Past Medical History  Diagnosis Date  . Hypercholesteremia   . Prostate cancer 2011  . Bleeding per rectum 08/2013    "black and bright red" (08/14/2013)  . Arthritis   . Obesity     Past Surgical History  Procedure Laterality Date  . Inguinal hernia repair Bilateral ~ 2004  . Robot assisted laparoscopic radical prostatectomy  08/2009    [name] 08/14/2009 (08/14/2013)  . Esophagogastroduodenoscopy N/A 08/15/2013    Procedure: ESOPHAGOGASTRODUODENOSCOPY (EGD);  Surgeon: Milus Banister, MD;  Location: [place];  Service: Endoscopy;  Laterality: N/A;    No  prescriptions prior to admission   No Known Allergies  History  Substance Use Topics  . Smoking status: Never Smoker   . Smokeless tobacco: Never Used  . Alcohol Use: Yes     Comment: 08/14/2013 "might have a drink q other holiday"    Family History  Problem Relation Age of Onset  . Diabetes Sister   . Diabetes Mother   . Colon cancer Neg Hx      Review of Systems  Musculoskeletal: Positive for joint pain.  All other systems reviewed and are negative.    Objective:  Physical Exam  Constitutional: He is oriented to person, place, and time. He appears well-developed and well-nourished.  HENT:  Head: Normocephalic and atraumatic.  Eyes: EOM are normal. Pupils are equal, round, and reactive to light.  Neck: Normal range of motion. Neck supple.  Cardiovascular: Normal rate and regular rhythm.   Respiratory: Effort normal and breath sounds normal.  GI: Soft. Bowel sounds are normal.  Musculoskeletal:       Left hip: He exhibits decreased range of motion, decreased strength, bony tenderness, crepitus and deformity.  Neurological: He is alert and oriented to person, place, and time.  Skin: Skin is warm and dry.  Psychiatric: He has a normal mood and affect.    Vital signs in last 24 hours:    Labs:   Estimated body mass index is 37.58 kg/(m^2) as calculated from the following:   Height as of 10/02/13: 5\' 7"  (1.702 m).   Weight as of 10/02/13: 108.863 kg (240 lb).   Imaging Review Plain radiographs demonstrate severe degenerative joint disease of the left hip(s). The bone quality  appears to be good for age and reported activity level.  Assessment/Plan:  End stage arthritis, left hip(s)  The patient history, physical examination, clinical judgement of the provider and imaging studies are consistent with end stage degenerative joint disease of the left hip(s) and total hip arthroplasty is deemed medically necessary. The treatment options including medical management,  injection therapy, arthroscopy and arthroplasty were discussed at length. The risks and benefits of total hip arthroplasty were presented and reviewed. The risks due to aseptic loosening, infection, stiffness, dislocation/subluxation,  thromboembolic complications and other imponderables were discussed.  The patient acknowledged the explanation, agreed to proceed with the plan and consent was signed. Patient is being admitted for inpatient treatment for surgery, pain control, PT, OT, prophylactic antibiotics, VTE prophylaxis, progressive ambulation and ADL's and discharge planning.The patient is planning to be discharged home with home health services

## 2013-11-14 NOTE — Transfer of Care (Signed)
Immediate Anesthesia Transfer of Care Note  Patient: Lance Rollins  Procedure(s) Performed: Procedure(s): LEFT TOTAL HIP ARTHROPLASTY ANTERIOR APPROACH (Left)  Patient Location: PACU  Anesthesia Type:General  Level of Consciousness: awake and alert   Airway & Oxygen Therapy: Patient Spontanous Breathing and Patient connected to nasal cannula oxygen  Post-op Assessment: Report given to PACU RN, Post -op Vital signs reviewed and stable and Patient moving all extremities X 4  Post vital signs: Reviewed and stable  Complications: No apparent anesthesia complications

## 2013-11-15 LAB — CBC
HCT: 25.9 % — ABNORMAL LOW (ref 39.0–52.0)
Hemoglobin: 7.9 g/dL — ABNORMAL LOW (ref 13.0–17.0)
MCH: 21.2 pg — ABNORMAL LOW (ref 26.0–34.0)
MCHC: 30.5 g/dL (ref 30.0–36.0)
MCV: 69.4 fL — ABNORMAL LOW (ref 78.0–100.0)
Platelets: 417 10*3/uL — ABNORMAL HIGH (ref 150–400)
RBC: 3.73 MIL/uL — ABNORMAL LOW (ref 4.22–5.81)
RDW: 18.8 % — ABNORMAL HIGH (ref 11.5–15.5)
WBC: 9.6 10*3/uL (ref 4.0–10.5)

## 2013-11-15 LAB — BASIC METABOLIC PANEL
BUN: 20 mg/dL (ref 6–23)
CALCIUM: 8.4 mg/dL (ref 8.4–10.5)
CO2: 23 mEq/L (ref 19–32)
CREATININE: 0.76 mg/dL (ref 0.50–1.35)
Chloride: 101 mEq/L (ref 96–112)
Glucose, Bld: 112 mg/dL — ABNORMAL HIGH (ref 70–99)
Potassium: 4.6 mEq/L (ref 3.7–5.3)
SODIUM: 137 meq/L (ref 137–147)

## 2013-11-15 LAB — PREPARE RBC (CROSSMATCH)

## 2013-11-15 NOTE — Progress Notes (Signed)
Subjective: 1 Day Post-Op Procedure(s) (LRB): LEFT TOTAL HIP ARTHROPLASTY ANTERIOR APPROACH (Left) Patient reports pain as moderate.  Has hgb down to 7.9, which for him is acute on chronic anemia when looking at his CBCs over the last few months.  VSS/asymptomatic.  Objective: Vital signs in last 24 hours: Temp:  [97.6 F (36.4 C)-99 F (37.2 C)] 98.3 F (36.8 C) (04/08 0433) Pulse Rate:  [78-100] 81 (04/08 0433) Resp:  [12-21] 18 (04/08 0433) BP: (116-158)/(64-89) 116/66 mmHg (04/08 0433) SpO2:  [93 %-100 %] 100 % (04/08 0433) Weight:  [107 kg (235 lb 14.3 oz)] 107 kg (235 lb 14.3 oz) (04/07 1805)  Intake/Output from previous day: 04/07 0701 - 04/08 0700 In: 2442.5 [P.O.:50; I.V.:2392.5] Out: 1510 [Urine:1010; Blood:500] Intake/Output this shift:     Recent Labs  11/15/13 0600  HGB 7.9*    Recent Labs  11/15/13 0600  WBC 9.6  RBC 3.73*  HCT 25.9*  PLT 417*    Recent Labs  11/15/13 0600  NA 137  K 4.6  CL 101  CO2 23  BUN 20  CREATININE 0.76  GLUCOSE 112*  CALCIUM 8.4   No results found for this basename: LABPT, INR,  in the last 72 hours  Sensation intact distally Intact pulses distally Dorsiflexion/Plantar flexion intact Incision: dressing C/D/I  Assessment/Plan: 1 Day Post-Op Procedure(s) (LRB): LEFT TOTAL HIP ARTHROPLASTY ANTERIOR APPROACH (Left) Up with therapy Transfuse PRBCs  Lance Rollins 11/15/2013, 7:15 AM

## 2013-11-15 NOTE — Op Note (Signed)
NAMEDARWIN, ROTHLISBERGER                ACCOUNT NO.:  [id number]  MEDICAL RECORD NO.:  [id number]  LOCATION:  5N25C                        FACILITY:  [facility]  PHYSICIAN:  Lind Guest. Ninfa Linden, M.D.DATE OF BIRTH:  1945-09-17  DATE OF PROCEDURE:  11/14/2013 DATE OF DISCHARGE:                              OPERATIVE REPORT   PREOPERATIVE DIAGNOSIS:  Severe end-stage arthritis, degenerative joint disease, left hip.  POSTOPERATIVE DIAGNOSIS:  Severe end-stage arthritis, degenerative joint disease, left hip.  PROCEDURE:  Left total hip arthroplasty through direct anterior approach.  IMPLANTS:  DePuy Sector Gription acetabular component size 54 with 2 screws in the apex hole eliminator guide, size 36+ 4 neutral polyethylene liner, size 9 Corail femoral component with varus offset (KLA), size 36+ 1.5 ceramic hip ball.  SURGEON:  Lind Guest. Ninfa Linden, M.D.  ASSISTANT:  Erskine Emery, P.A.  ANESTHESIA:  General.  BLOOD LOSS:  500 mL.  ANTIBIOTICS:  2 g of IV Ancef.  COMPLICATIONS:  None.  INDICATIONS:  Mr. Eisler is a very well-known individual to me.  He has severe debilitating arthritis involving both his hips.  His left is worse than right.  The left shows complete loss of the joint space. There is periarticular osteophytes, subchondral sclerosis, cystic changes, and complete loss of the joint space.  At this point, given his daily pain with decreased mobility and the impact on his quality of life, he wished to proceed with a total hip arthroplasty.  He understands the risks of acute blood loss anemia, nerve or vessel injury, fracture, infection, dislocation, and DVT.  He understands the goal are decreased pain, improved mobility, and overall improved quality of life.  PROCEDURE DESCRIPTION:  After informed consent was obtained, appropriate left hip was marked.  He was brought to the operating room and general anesthesia was obtained while he was on the stretcher.  Foley  catheter was placed.  Next, he was placed supine on the HANA fracture table with the perineal posts in place and both legs in inline skeletal traction, but no traction applied.  His left operative hip was then prepped and draped with DuraPrep and sterile drapes.  Time-out was called and he was identified as correct patient, correct left hip.  I then made an incision inferior and posterior to the anterosuperior iliac spine and carried this obliquely down the leg.  We dissected down to the tensor fascia lata muscle and the tensor fascia was divided longitudinally so we could proceed with a direct anterior approach to the hip.  A Cobra retractor was placed around the lateral neck and up underneath the rectus femoris around the medial neck.  I cauterized the lateral femoral circumflex vessels and then opened up the hip capsule in a L-type format placing the Cobra retractors within the hip capsule with now significant osteophytes.  We cauterized the lateral femoral circumflex vessels and I then made my femoral neck cut about a fingerbreadth proximal to the lesser trochanter using an oscillating saw and completed this with an osteotome.  I used a corkscrew guide in the femoral head and removed the femoral head in its entirety and found it to be devoid of cartilage.  We had to clean  the acetabulum of significant debris and periarticular osteophytes.  We then began reaming from a size 42 reamer in 2 mm increments up to a size 54 with all reamers placed under direct visualization and the last reamer under direct fluoroscopy so we could obtain our depth of reaming, our inclination, and anteversion.  I then placed an apex hole eliminator guide and 2 screws due to sclerotic bone. We placed the real 36+ 4 neutral polyethylene liner.  Attention was then turned to the femur.  With the leg externally rotated to 100 degrees, extended, and adducted, we were able to gain access to the femoral canal after a  Mueller retractors were placed medially and the Hohmann retractor behind the greater trochanter.  We used a box cutting osteotome to enter the femoral canal and a rongeur to lateralize and then I began broaching from a size 8 broach only up to a size 9.  Given the offset, we tried a varus neck and a 36+ 1.5 hip ball.  We brought the leg back over and up, and with traction and internal rotation, we reduced it into the pelvis and it was stable past 90 degrees of external rotation and 45 degrees of internal rotation.  He had minimal shuck and his offset leg lengths were measured and they were equal under direct fluoroscopy.  We then dislocated the hip and removed the trial components.  We placed the real DePuy femoral component, which was Corail, size 9 with varus offset followed by the 36+ 1.5 ceramic hip ball, reduced this back in the acetabulum, looked stable, we copiously irrigated the soft tissue with normal saline solution using pulsatile lavage.  We closed the joint capsule with interrupted #1 Ethibond suture followed by a running #1 Vicryl in the tensor fascia, 0-Vicryl in the deep tissue, 2-0 Vicryl in the subcutaneous tissue, and 4-0 Monocryl subcuticular stitch.  Steri-Strips on the skin and an Aquacel dressing was applied.  He was then taken off the HANA table, awakened, extubated, and taken to the recovery room in a stable condition.  All final counts were correct, and there were no complications noted.  Of note, Carney Bern, PA-C assisted during the entire case and his assistance was significant and crucial with retracting, patient positioning, and layered closure of the wound.     Lind Guest. Ninfa Linden, M.D.     CYB/MEDQ  D:  11/14/2013  T:  11/15/2013  Job:  [id number]

## 2013-11-15 NOTE — Progress Notes (Signed)
Utilization review completed.  

## 2013-11-15 NOTE — Anesthesia Postprocedure Evaluation (Signed)
  Anesthesia Post-op Note  Patient: Lance Rollins  Procedure(s) Performed: Procedure(s): LEFT TOTAL HIP ARTHROPLASTY ANTERIOR APPROACH (Left)  Patient Location: PACU  Anesthesia Type:General  Level of Consciousness: awake  Airway and Oxygen Therapy: Patient Spontanous Breathing  Post-op Pain: mild  Post-op Assessment: Post-op Vital signs reviewed, Patient's Cardiovascular Status Stable, Respiratory Function Stable, Patent Airway, No signs of Nausea or vomiting and Pain level controlled  Post-op Vital Signs: Reviewed and stable  Last Vitals:  Filed Vitals:   11/15/13 1230  BP: 154/76  Pulse: 92  Temp: 37.1 C  Resp: 16    Complications: No apparent anesthesia complications

## 2013-11-15 NOTE — Care Management Note (Signed)
CARE MANAGEMENT NOTE 11/15/2013  Patient:  Lance Rollins, Lance Rollins   Account Number:  [id number]  Date Initiated:  11/15/2013  Documentation initiated by:  Ricki Miller  Subjective/Objective Assessment:   68 yr old male s/p left total hip arthroplasty.     Action/Plan:   Case manager spoke with patient concerning home health and DME needs at discharge. Patient preoperatively setup with [facility], no changes. Has family support at discharge.   Anticipated DC Date:  11/16/2013   Anticipated DC Plan:  [place]  CM consult      [facility] Choice  HOME HEALTH  DURABLE MEDICAL EQUIPMENT   Choice offered to / List presented to:  C-1 Patient   DME arranged  [place]  3-N-1      DME agency  [employer]     [place] arranged  HH-2 PT      [place] agency  [facility]   Status of service:  Completed, signed off Medicare Important Message given?   (If response is "NO", the following Medicare IM given date fields will be blank) Date Medicare IM given:   Date Additional Medicare IM given:    Discharge Disposition:  [place]  Per UR Regulation:    If discussed at Long Length of Stay Meetings, dates discussed:    Comments:

## 2013-11-15 NOTE — Evaluation (Signed)
Physical Therapy Evaluation Patient Details Name: Lance Rollins MRN: [id number] DOB: 1946-05-10 Today's Date: 11/15/2013   History of Present Illness  Pt. admitted for left TKA due to arthritis.  Clinical Impression  Pt is s/p THA resulting in the deficits listed below (see PT Problem List). Pt will benefit from skilled PT to increase their independence and safety with mobility to allow discharge to the venue listed below. Pt will be at home for a few hours at a time alone, but we believe he will progress well toward modified independence and d/c safely.       Follow Up Recommendations Home health PT    Equipment Recommendations  Rolling walker with 5" wheels;3in1 (PT) (unless pt already has them)    Recommendations for Other Services       Precautions / Restrictions Restrictions Weight Bearing Restrictions: Yes LLE Weight Bearing: Weight bearing as tolerated      Mobility  Bed Mobility Overal bed mobility: Needs Assistance (needed support with L LE as well as verbal cueing) Bed Mobility: Supine to Sit     Supine to sit: Min assist        Transfers Overall transfer level: Needs assistance Equipment used: Rolling walker (2 wheeled) Transfers: Sit to/from Stand (verbal and tactile cues for hand and foot placement) Sit to Stand: Min guard (contact guard for safety)            Ambulation/Gait Ambulation/Gait assistance: Min guard (contact guard for safety) Ambulation Distance (Feet): 120 Feet Assistive device: Rolling walker (2 wheeled) Gait Pattern/deviations: Step-to pattern;Decreased stance time - left;Antalgic      Cues for gait sequence and posture; Cues also to self-monitor for activity tolerance  Stairs            Wheelchair Mobility    Modified Rankin (Stroke Patients Only)       Balance                                             Pertinent Vitals/Pain At rest: 5/10 pain Walking: 6/10  Premedicated for pain. patient  repositioned for comfort     Home Living Family/patient expects to be discharged to:: Private residence Living Arrangements: Spouse/significant other Available Help at Discharge: Family (spouse works during the day) Type of Home: [place] Access: Stairs to enter Entrance Stairs-Rails: None Technical brewer of Steps: 2 Home Layout: Two level;Bed/bath upstairs Home Equipment: None (TBD)      Prior Function Level of Independence: Independent (Not sure if pt used assistive device prior)               Hand Dominance        Extremity/Trunk Assessment   Upper Extremity Assessment: Overall WFL for tasks assessed           Lower Extremity Assessment: LLE deficits/detail   LLE Deficits / Details: ROM WFL but not painfree; benefited from decreased friction     Communication   Communication: No difficulties  Cognition Arousal/Alertness: Awake/alert Behavior During Therapy: WFL for tasks assessed/performed Overall Cognitive Status: Within Functional Limits for tasks assessed                      General Comments      Exercises Total Joint Exercises Ankle Circles/Pumps: 5 reps;AROM;Both;Supine Quad Sets: 10 reps;AROM;Supine;Left (good quad activation) Heel Slides: 10 reps;AROM;Left;Supine (supported heel to reduce friction)  Hip ABduction/ADduction: 10 reps;Left;AROM;Supine (supported heel to reduce friction)      Assessment/Plan    PT Assessment Patient needs continued PT services  PT Diagnosis Difficulty walking;Abnormality of gait;Acute pain   PT Problem List Decreased range of motion;Decreased strength;Decreased mobility;Obesity;Pain  PT Treatment Interventions DME instruction;Gait training;Therapeutic exercise   PT Goals (Current goals can be found in the Care Plan section) Acute Rehab PT Goals Patient Stated Goal: get home; be able to keep up with his 80 month old grandson PT Goal Formulation: With patient Time For Goal Achievement:  11/22/13 Potential to Achieve Goals: Good    Frequency 7X/week   Barriers to discharge Other (comment) has a flight of steps to access bathroom/bedroom; will be home alone for portions of the day; will likely be able to manage these to d/c home safely    Co-evaluation               End of Session Equipment Utilized During Treatment: Gait belt Activity Tolerance: Patient tolerated treatment well Patient left: in chair;with call bell/phone within reach           Time: 8938-1017 PT Time Calculation (min): 41 min   Charges:         PT G Codes:          Lance Rollins 12-08-13, 12:14 PM  Lance Rollins, Lance Rollins Pager [phone] Office [phone]

## 2013-11-15 NOTE — Progress Notes (Addendum)
Physical Therapy Treatment Patient Details Name: Lance Rollins MRN: [id number] DOB: 05-03-1946 Today's Date: 11/15/2013    History of Present Illness Pt. admitted for left TKA due to arthritis.    PT Comments    Making good progress towards goals, including able to initiate stair training this session; The notion of dc'ing home tomorrow is not unreasonable based on his performance with functional mobility today  Will need to continue stair training for pt to access secnd level of his house (where bathroom is)  Follow Up Recommendations  Home health PT     Equipment Recommendations  Rolling walker with 5" wheels;3in1 (PT) (unless pt already has them)    Recommendations for Other Services       Precautions / Restrictions Restrictions Weight Bearing Restrictions: Yes LLE Weight Bearing: Weight bearing as tolerated    Mobility  Bed Mobility Overal bed mobility: Needs Assistance (needed support with L LE as well as verbal cueing) Bed Mobility: Supine to Sit     Supine to sit: Min assist     General bed mobility comments: Cues for technique  Transfers Overall transfer level: Needs assistance Equipment used: Rolling walker (2 wheeled) Transfers: Sit to/from Stand Sit to Stand: Min guard (contact guard for safety)         General transfer comment: Cues for hand placement and safety; cues also to control descent  Ambulation/Gait Ambulation/Gait assistance: Min guard Ambulation Distance (Feet): 200 Feet Assistive device: Rolling walker (2 wheeled) Gait Pattern/deviations: Decreased stride length Gait velocity: decr   General Gait Details: Continues to push self for greater gait distance   Stairs Stairs: Yes Stairs assistance: Min assist Stair Management: No rails;Step to pattern;Backwards;With walker Number of Stairs: 2 General stair comments: Wife present for stair training; Verbal and demo cues were given fro technique, and pt was able to teach back  sequence  Wheelchair Mobility    Modified Rankin (Stroke Patients Only)       Balance                                    Cognition Arousal/Alertness: Awake/alert Behavior During Therapy: WFL for tasks assessed/performed Overall Cognitive Status: Within Functional Limits for tasks assessed                      Exercises      General Comments        Pertinent Vitals/Pain Reported 6/10 hip pain end of session patient repositioned for comfort, and RN gave pain meds immediately after PT session     Home Living                      Prior Function            PT Goals (current goals can now be found in the care plan section) Acute Rehab PT Goals Patient Stated Goal: get home; be able to keep up with his 74 month old grandson PT Goal Formulation: With patient Time For Goal Achievement: 11/22/13 Potential to Achieve Goals: Good Progress towards PT goals: Progressing toward goals    Frequency  7X/week    PT Plan Current plan remains appropriate    Co-evaluation             End of Session Equipment Utilized During Treatment: Gait belt Activity Tolerance: Patient tolerated treatment well Patient left: in chair;with call bell/phone within reach  Time: 4742-5956 PT Time Calculation (min): 37 min  Charges:  $Gait Training: 23-37 mins                    G Codes:      Lance Rollins 11/15/2013, 9:22 PM  Roney Marion, [place] Pager [phone] Office [phone]

## 2013-11-15 NOTE — Progress Notes (Signed)
Occupational Therapy Evaluation and Discharge Patient Details Name: Lance Rollins MRN: [id number] DOB: 04/29/1946 Today's Date: 11/15/2013    History of Present Illness Pt is 68 y.o Male s/p L TKA due to arthritis   Clinical Impression   PTA pt lived at home with his wife and was independent with ADLs and mobility. Pt reports that his wife works during the day, however his son-in-law will be available PRN to assist with ADLs. Pt declined OOB activities, including opportunity to practice tub transfer. Per PT note, pt at min guard for functional mobility. Education and training completed on compensatory technique for LB ADLs. Educated and demonstrated safe tub transfer with use of RW and BSC and provided pt with handout. Pt has no further OT concerns. Acute OT to sign off.     Follow Up Recommendations  No OT follow up;Supervision/Assistance - 24 hour    Equipment Recommendations  3 in 1 bedside comode       Precautions / Restrictions Restrictions Weight Bearing Restrictions: Yes LLE Weight Bearing: Weight bearing as tolerated      Mobility  Per PT note, pt at [facility] level for functional mobility.                  ADL Overall ADL's : Needs assistance/impaired Eating/Feeding: Independent;Sitting   Grooming: Set up;Sitting   Upper Body Bathing: Set up;Sitting   Lower Body Bathing: Minimal assistance;Sit to/from stand   Upper Body Dressing : Set up;Sitting   Lower Body Dressing: Moderate assistance;Sit to/from stand                 General ADL Comments: Pt declined OOB activities; participated in grooming activities in sitting.                Pertinent Vitals/Pain Pt c/o pain in L hip/lower back area. Provided pt with ice and repositioned.      Hand Dominance Right   Extremity/Trunk Assessment Upper Extremity Assessment Upper Extremity Assessment: Overall WFL for tasks assessed   Lower Extremity Assessment Lower Extremity Assessment: Defer to PT  evaluation LLE Deficits / Details: ROM WFL but not painfree; benefited from decreased friction       Communication Communication Communication: No difficulties   Cognition Arousal/Alertness: Awake/alert Behavior During Therapy: WFL for tasks assessed/performed Overall Cognitive Status: Within Functional Limits for tasks assessed                                Home Living Family/patient expects to be discharged to:: Private residence Living Arrangements: Spouse/significant other Available Help at Discharge: Family;Available PRN/intermittently (wife works during the day; son-in-law will help PRN) Type of Home: House Home Access: Stairs to enter [employer] of Steps: 2 Entrance Stairs-Rails: None Home Layout: Two level;Bed/bath upstairs Alternate Level Stairs-Number of Steps: 12 Alternate Level Stairs-Rails: Right Bathroom Shower/Tub: Tub/shower unit;Curtain Shower/tub characteristics: Architectural technologist: Standard     Home Equipment: None          Prior Functioning/Environment Level of Independence: Independent                                       End of Session Equipment Utilized During Treatment: Other (comment) (leg lifter)  Activity Tolerance: Patient tolerated treatment well Patient left: in chair;with call bell/phone within reach   Time: 1420-1450 OT Time Calculation (min): 30  min Charges:  OT General Charges $OT Visit: 1 Procedure OT Evaluation $Initial OT Evaluation Tier I: 1 Procedure OT Treatments $Self Care/Home Management : 23-37 mins  Juluis Rainier [phone] 11/15/2013, 2:51 PM

## 2013-11-16 ENCOUNTER — Encounter (HOSPITAL_COMMUNITY): Payer: Self-pay | Admitting: Orthopaedic Surgery

## 2013-11-16 LAB — CBC
HEMATOCRIT: 27.5 % — AB (ref 39.0–52.0)
Hemoglobin: 8.8 g/dL — ABNORMAL LOW (ref 13.0–17.0)
MCH: 22.5 pg — ABNORMAL LOW (ref 26.0–34.0)
MCHC: 32 g/dL (ref 30.0–36.0)
MCV: 70.3 fL — ABNORMAL LOW (ref 78.0–100.0)
PLATELETS: 360 10*3/uL (ref 150–400)
RBC: 3.91 MIL/uL — ABNORMAL LOW (ref 4.22–5.81)
RDW: 18.6 % — ABNORMAL HIGH (ref 11.5–15.5)
WBC: 17.2 10*3/uL — ABNORMAL HIGH (ref 4.0–10.5)

## 2013-11-16 LAB — TYPE AND SCREEN
ABO/RH(D): B POS
Antibody Screen: NEGATIVE
Unit division: 0

## 2013-11-16 MED ORDER — OXYCODONE-ACETAMINOPHEN 5-325 MG PO TABS: 1.0000 | ORAL_TABLET | ORAL | Status: DC | PRN

## 2013-11-16 MED ORDER — BISACODYL 10 MG RE SUPP
10.0000 mg | Freq: Once | RECTAL | Status: AC
  Administered 2013-11-16: 10 mg via RECTAL
  Filled 2013-11-16: qty 1

## 2013-11-16 MED ORDER — FERROUS SULFATE 325 (65 FE) MG PO TABS: 325.0000 mg | ORAL_TABLET | Freq: Three times a day (TID) | ORAL | Status: DC

## 2013-11-16 MED ORDER — ASPIRIN 325 MG PO TBEC: 325.0000 mg | DELAYED_RELEASE_TABLET | Freq: Two times a day (BID) | ORAL | Status: DC

## 2013-11-16 NOTE — Discharge Summary (Signed)
Patient ID: Lance Rollins MRN: [id number] DOB/AGE: October 24, 1945 68 y.o.  Admit date: 11/14/2013 Discharge date: 11/16/2013  Admission Diagnoses:  Principal Problem:   Arthritis of left hip Active Problems:   Status post THR (total hip replacement)   Discharge Diagnoses:  S/P left total hip arthroplasty direct anterior appraoch Acute on chronic anemia  Past Medical History  Diagnosis Date  . Hypercholesteremia   . Prostate cancer 2011  . Bleeding per rectum 08/2013    "black and bright red" (08/14/2013)  . Arthritis   . Obesity     Surgeries: Procedure(s): LEFT TOTAL HIP ARTHROPLASTY ANTERIOR APPROACH on 11/14/2013   Consultants:    Discharged Condition: Improved  Hospital Course: Lance Rollins is an 68 y.o. male who was admitted 11/14/2013 for operative treatment ofArthritis of left hip. Patient has severe unremitting pain that affects sleep, daily activities, and work/hobbies. After pre-op clearance the patient was taken to the operating room on 11/14/2013 and underwent  Procedure(s): LEFT TOTAL HIP ARTHROPLASTY ANTERIOR APPROACH.  Acute on chronic anemia requiring one unit of PRBC's. Patient asymptomatic for anemia at discharge.    Patient was given perioperative antibiotics: Anti-infectives   Start     Dose/Rate Route Frequency Ordered Stop   11/14/13 1815  ceFAZolin (ANCEF) IVPB 1 g/50 mL premix     1 g 100 mL/hr over 30 Minutes Intravenous Every 6 hours 11/14/13 1804 11/15/13 0002   11/14/13 0600  ceFAZolin (ANCEF) IVPB 2 g/50 mL premix     2 g 100 mL/hr over 30 Minutes Intravenous On call to O.R. 11/13/13 1408 11/14/13 1440       Patient was given sequential compression devices, early ambulation, and chemoprophylaxis to prevent DVT.  Patient benefited maximally from hospital stay and there were no complications.    Recent vital signs: Patient Vitals for the past 24 hrs:  BP Temp Temp src Pulse Resp SpO2  11/16/13 0507 134/76 mmHg 99.2 F (37.3 C) Oral 101 16 96 %   11/16/13 0400 - - - - 16 95 %  11/16/13 0211 - 99.7 F (37.6 C) Oral - - -  11/16/13 0000 - - - - 16 92 %  11/15/13 2154 160/77 mmHg 100.8 F (38.2 C) Oral 107 16 91 %  11/15/13 2000 - - - - 16 92 %  11/15/13 1230 154/76 mmHg 98.8 F (37.1 C) Oral 92 16 97 %  11/15/13 1150 - - - - 18 98 %  11/15/13 1133 139/62 mmHg 97.9 F (36.6 C) Oral 83 16 100 %  11/15/13 1033 131/65 mmHg 98.1 F (36.7 C) Oral 81 16 98 %     Recent laboratory studies:  Recent Labs  11/15/13 0600 11/16/13 0625  WBC 9.6 17.2*  HGB 7.9* 8.8*  HCT 25.9* 27.5*  PLT 417* 360  NA 137  --   K 4.6  --   CL 101  --   CO2 23  --   BUN 20  --   CREATININE 0.76  --   GLUCOSE 112*  --   CALCIUM 8.4  --      Discharge Medications:     Medication List    STOP taking these medications       acetaminophen 500 MG tablet  Commonly known as:  TYLENOL     traMADol 50 MG tablet  Commonly known as:  ULTRAM      TAKE these medications       aspirin 325 MG EC tablet  Take 1 tablet (325 mg  total) by mouth 2 (two) times daily after a meal.     ferrous sulfate 325 (65 FE) MG tablet  Take 1 tablet (325 mg total) by mouth 3 (three) times daily after meals.     oxyCODONE-acetaminophen 5-325 MG per tablet  Commonly known as:  ROXICET  Take 1-2 tablets by mouth every 4 (four) hours as needed for severe pain.     pantoprazole 40 MG tablet  Commonly known as:  PROTONIX  Take 1 tablet (40 mg total) by mouth daily.     simvastatin 40 MG tablet  Commonly known as:  ZOCOR  Take 40 mg by mouth every evening.        Diagnostic Studies: Dg Hip Operative Left  11/14/2013   CLINICAL DATA:  Degenerative arthritis, status post left hip arthroplasty  EXAM: OPERATIVE LEFT HIP  TECHNIQUE: Spot fluoroscopic intraoperative views  COMPARISON:  None available  IMPRESSION: Status post left hip arthroplasty.  No complicating feature.   Electronically Signed   By: Daryll Brod M.D.   On: 11/14/2013 16:35   Dg Pelvis  Portable  11/14/2013   CLINICAL DATA:  Postop  EXAM: PORTABLE PELVIS 1-2 VIEWS  COMPARISON:  None.  FINDINGS: Single frontal view of the pelvis submitted. There is left hip prosthesis in anatomic alignment. Degenerative changes are noted right hip joint with narrowing of superior joint space.  IMPRESSION: Left hip prosthesis in anatomic alignment. Degenerative changes right hip joint.   Electronically Signed   By: Lahoma Crocker M.D.   On: 11/14/2013 18:22   Dg Hip Portable 1 View Left  11/14/2013   CLINICAL DATA:  .  EXAM: PORTABLE LEFT HIP - 1 VIEW  COMPARISON:  None.  FINDINGS: There is left hip prosthesis in anatomic alignment. Postsurgical changes are noted.  IMPRESSION: Left hip prosthesis in anatomic alignment.   Electronically Signed   By: Lahoma Crocker M.D.   On: 11/14/2013 18:23    Disposition: 01-Home or Self Care      Discharge Orders   Future Orders Complete By Expires   Discharge wound care:  As directed    Weight bearing as tolerated  As directed    Questions:     Laterality:     Extremity:        Follow-up Information   Follow up with [facility]. (Someone from [facility] will contact you concerning physical therapy start date and time.)    Contact information:   [address] [place] [address] [phone]        Signed: Erskine Emery 11/16/2013, 9:42 AM

## 2013-11-16 NOTE — Progress Notes (Signed)
Subjective: 2 Days Post-Op Procedure(s) (LRB): LEFT TOTAL HIP ARTHROPLASTY ANTERIOR APPROACH (Left) Patient reports pain as moderate.  No BM. Doing well with PT.  Objective: Vital signs in last 24 hours: Temp:  [97.9 F (36.6 C)-100.8 F (38.2 C)] 99.2 F (37.3 C) (04/09 0507) Pulse Rate:  [81-107] 101 (04/09 0507) Resp:  [16-18] 16 (04/09 0507) BP: (131-160)/(62-77) 134/76 mmHg (04/09 0507) SpO2:  [91 %-100 %] 96 % (04/09 0507)  Intake/Output from previous day: 04/08 0701 - 04/09 0700 In: 1580 [P.O.:1080; I.V.:500] Out: 625 [Urine:625] Intake/Output this shift:     Recent Labs  11/15/13 0600 11/16/13 0625  HGB 7.9* 8.8*    Recent Labs  11/15/13 0600 11/16/13 0625  WBC 9.6 17.2*  RBC 3.73* 3.91*  HCT 25.9* 27.5*  PLT 417* 360    Recent Labs  11/15/13 0600  NA 137  K 4.6  CL 101  CO2 23  BUN 20  CREATININE 0.76  GLUCOSE 112*  CALCIUM 8.4   No results found for this basename: LABPT, INR,  in the last 72 hours  Neurovascular intact Incision: dressing C/D/I Compartment soft  Assessment/Plan: 2 Days Post-Op Procedure(s) (LRB): LEFT TOTAL HIP ARTHROPLASTY ANTERIOR APPROACH (Left) Discharge home with home health ( later this afternoon) Suppository this AM   Lance Rollins 11/16/2013, 9:38 AM

## 2013-11-16 NOTE — Discharge Instructions (Signed)
Pick up a stool softener for constipation

## 2013-11-16 NOTE — Progress Notes (Signed)
PT Cancellation Note  Patient Details Name: Lance Rollins MRN: [id number] DOB: [birth year]   Cancelled Treatment:    Reason Eval/Treat Not Completed: Other (comment) Politely declined pm PT session because his super-cute grandson was visiting:)   [facility] Gregary Cromer 11/16/2013, 4:40 PM

## 2013-11-16 NOTE — Progress Notes (Signed)
Physical Therapy Treatment Patient Details Name: Cashmere Harmes MRN: [id number] DOB: 1946-01-26 Today's Date: 11/16/2013    History of Present Illness Pt. admitted for left TKA due to arthritis.    PT Comments    Pt very motivated and agreeable to therapy. He has far exceeded walking household distances with the walker and is also able to recall and teach back cues for stairs with and without the use of railings. From a functional mobility standpoint he is ok for d/c home with family support.  Follow Up Recommendations  Home health PT     Equipment Recommendations  Rolling walker with 5" wheels;3in1 (PT)    Recommendations for Other Services       Precautions / Restrictions Restrictions Weight Bearing Restrictions: Yes LLE Weight Bearing: Weight bearing as tolerated    Mobility  Bed Mobility Overal bed mobility: Needs Assistance Bed Mobility: Supine to Sit;Sit to Supine     Supine to sit: Min assist Sit to supine: Min assist   General bed mobility comments: support needed for L LE  Transfers Overall transfer level: Needs assistance Equipment used: Rolling walker (2 wheeled) Transfers: Sit to/from Stand Sit to Stand: Min guard         General transfer comment: Cues for hand placement and safety; cues also to control descent  Ambulation/Gait Ambulation/Gait assistance: Min guard Ambulation Distance (Feet): 280 Feet Assistive device: Rolling walker (2 wheeled) Gait Pattern/deviations: Step-through pattern;Antalgic;Decreased stride length;Decreased stance time - left     General Gait Details: Continues to push self for greater gait distance   Stairs Stairs: Yes Stairs assistance: Min assist Stair Management: No rails;One rail Right;With walker Number of Stairs: 5 General stair comments: verbal and demo cues given for technique, and patient able to teach back sequence  Wheelchair Mobility    Modified Rankin (Stroke Patients Only)       Balance                                     Cognition Arousal/Alertness: Awake/alert Behavior During Therapy: WFL for tasks assessed/performed Overall Cognitive Status: Within Functional Limits for tasks assessed                      Exercises Total Joint Exercises Quad Sets: 10 reps;AROM;Left;Supine Heel Slides: Left;10 reps;Supine;AROM Hip ABduction/ADduction: AROM;Left;10 reps;Supine    General Comments        Pertinent Vitals/Pain Rest: 6/10 Bed exercises: 6/10 Walking: 7/10  Pt premedicated and repositioned for comfort.     Home Living                      Prior Function            PT Goals (current goals can now be found in the care plan section) Acute Rehab PT Goals Patient Stated Goal: get home; be able to keep up with his 56 month old grandson PT Goal Formulation: With patient Time For Goal Achievement: 11/22/13 Potential to Achieve Goals: Good Progress towards PT goals: Progressing toward goals    Frequency  7X/week    PT Plan Current plan remains appropriate    Co-evaluation             End of Session Equipment Utilized During Treatment: Gait belt Activity Tolerance: Patient tolerated treatment well Patient left: in bed;with call bell/phone within reach     Time: 5053-9767 PT Time Calculation (  min): 41 min  Charges:  $Gait Training: 23-37 mins $Therapeutic Exercise: 8-22 mins                    G Codes:      Alfonse Spruce, SPT 11/16/2013, 1:38 PM  Roney Marion, PT  Acute Rehabilitation Services Pager [phone] Office [phone]

## 2013-11-16 NOTE — Progress Notes (Signed)
Patient discharged to home accompanied by wife. Discharge instructions and rx given and explained and patient stated understanding. Patient's IV was removed and patient left unit in a stable condition with all personal belongings via wheelchair.

## 2014-03-07 ENCOUNTER — Encounter: Payer: Self-pay | Admitting: *Deleted

## 2014-06-12 ENCOUNTER — Other Ambulatory Visit: Payer: Self-pay | Admitting: Family Medicine

## 2014-06-12 DIAGNOSIS — N6310 Unspecified lump in the right breast, unspecified quadrant: Secondary | ICD-10-CM

## 2014-06-12 DIAGNOSIS — N63 Unspecified lump in unspecified breast: Secondary | ICD-10-CM

## 2014-06-25 ENCOUNTER — Encounter (INDEPENDENT_AMBULATORY_CARE_PROVIDER_SITE_OTHER): Payer: Self-pay

## 2014-06-25 ENCOUNTER — Ambulatory Visit
Admission: RE | Admit: 2014-06-25 | Discharge: 2014-06-25 | Disposition: A | Discharge status: Home / Self Care | Payer: Medicare Other | Source: Ambulatory Visit | Attending: Family Medicine | Admitting: Family Medicine

## 2014-06-25 DIAGNOSIS — N63 Unspecified lump in unspecified breast: Secondary | ICD-10-CM

## 2014-06-25 DIAGNOSIS — N6310 Unspecified lump in the right breast, unspecified quadrant: Secondary | ICD-10-CM

## 2015-09-24 ENCOUNTER — Ambulatory Visit (INDEPENDENT_AMBULATORY_CARE_PROVIDER_SITE_OTHER): Payer: Medicare Other | Admitting: Gastroenterology

## 2015-09-24 ENCOUNTER — Encounter: Payer: Self-pay | Admitting: Gastroenterology

## 2015-09-24 ENCOUNTER — Other Ambulatory Visit (INDEPENDENT_AMBULATORY_CARE_PROVIDER_SITE_OTHER): Payer: Medicare Other

## 2015-09-24 VITALS — BP 140/80 | HR 76 | Ht 67.5 in | Wt 238.2 lb

## 2015-09-24 DIAGNOSIS — R945 Abnormal results of liver function studies: Principal | ICD-10-CM

## 2015-09-24 DIAGNOSIS — R7989 Other specified abnormal findings of blood chemistry: Secondary | ICD-10-CM

## 2015-09-24 LAB — CBC WITH DIFFERENTIAL/PLATELET
BASOS ABS: 0.1 10*3/uL (ref 0.0–0.1)
Basophils Relative: 1.2 % (ref 0.0–3.0)
Eosinophils Absolute: 0.3 10*3/uL (ref 0.0–0.7)
Eosinophils Relative: 3.9 % (ref 0.0–5.0)
HEMATOCRIT: 43.4 % (ref 39.0–52.0)
Hemoglobin: 14.5 g/dL (ref 13.0–17.0)
LYMPHS PCT: 25.8 % (ref 12.0–46.0)
Lymphs Abs: 1.9 10*3/uL (ref 0.7–4.0)
MCHC: 33.5 g/dL (ref 30.0–36.0)
MCV: 82.8 fl (ref 78.0–100.0)
Monocytes Absolute: 0.7 10*3/uL (ref 0.1–1.0)
Monocytes Relative: 9.2 % (ref 3.0–12.0)
Neutro Abs: 4.5 10*3/uL (ref 1.4–7.7)
Neutrophils Relative %: 59.9 % (ref 43.0–77.0)
PLATELETS: 299 10*3/uL (ref 150.0–400.0)
RBC: 5.24 Mil/uL (ref 4.22–5.81)
RDW: 14.8 % (ref 11.5–15.5)
WBC: 7.4 10*3/uL (ref 4.0–10.5)

## 2015-09-24 LAB — COMPREHENSIVE METABOLIC PANEL
ALBUMIN: 4.1 g/dL (ref 3.5–5.2)
ALT: 47 U/L (ref 0–53)
AST: 28 U/L (ref 0–37)
Alkaline Phosphatase: 121 U/L — ABNORMAL HIGH (ref 39–117)
BUN: 22 mg/dL (ref 6–23)
CALCIUM: 9.6 mg/dL (ref 8.4–10.5)
CHLORIDE: 102 meq/L (ref 96–112)
CO2: 30 meq/L (ref 19–32)
Creatinine, Ser: 0.83 mg/dL (ref 0.40–1.50)
GFR: 118.56 mL/min (ref >60.00)
Glucose, Bld: 96 mg/dL (ref 70–99)
POTASSIUM: 4.4 meq/L (ref 3.5–5.1)
Sodium: 138 mEq/L (ref 135–145)
Total Bilirubin: 0.5 mg/dL (ref 0.2–1.2)
Total Protein: 8 g/dL (ref 6.0–8.3)

## 2015-09-24 LAB — IBC PANEL
IRON: 44 ug/dL (ref 42–165)
Saturation Ratios: 10.2 % — ABNORMAL LOW (ref 20.0–50.0)
Transferrin: 309 mg/dL (ref 212.0–360.0)

## 2015-09-24 LAB — IGA: IgA: 326 mg/dL (ref 68–378)

## 2015-09-24 LAB — FERRITIN: FERRITIN: 61.9 ng/mL (ref 22.0–322.0)

## 2015-09-24 NOTE — Progress Notes (Signed)
Review of pertinent gastrointestinal problems: 1. Melena, anemia: EGD, Ardis Hughs 08/2013: There was mild, diffuse non-specific gastritis. Biopsies were taken from antrum and sent to pathology. There was a fleshy, 18mm polyp along greater curve of gastric body. This polyp bled with minimal trauma from scope and it was removed with snare/cautery. The examination was otherwise normal. Had been taking meloxicam daily. Biopseis showed no H. Pylori, polyp was HP. Was put on BID PP. 2. Adenomatous colon polyps, colonoscopy Dr. Ardis Hughs 09/2013 done for routine risk screening, 3 subcentimeter polyps were removed. He was also found to have left-sided diverticulosis. He was recommended to have recall colonoscopy at 3 year interval.   HPI: This is a   very pleasant 70 year old man     who was referred to me by Lona Kettle, MD  to evaluate  abnormal liver tests .    He has never had issues with liver in past  No FH of liver disease.  Never jaundiced.  Never hepatitis.  Has been trying to lose weight, 15 pounds in several months.  Never pruritis.  No imaging.  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 Complete review of systems was performed and was otherwise normal.   Past Medical History  Diagnosis Date  .  Hypercholesteremia   . Prostate cancer ([place]) 2011  . Bleeding per rectum 08/2013    "black and bright red" (08/14/2013)  . Arthritis   . Obesity     Past Surgical History  Procedure Laterality Date  . Inguinal hernia repair Bilateral ~ 2004  . Robot assisted laparoscopic radical prostatectomy  08/2009    [name] 08/14/2009 (08/14/2013)  . Esophagogastroduodenoscopy N/A 08/15/2013    Procedure: ESOPHAGOGASTRODUODENOSCOPY (EGD);  Surgeon: Milus Banister, MD;  Location: [place];  Service: Endoscopy;  Laterality: N/A;  . Total hip arthroplasty Left 11/14/2013    Procedure: LEFT TOTAL HIP ARTHROPLASTY ANTERIOR APPROACH;  Surgeon: Mcarthur Rossetti, MD;  Location: [place];  Service: Orthopedics;  Laterality: Left;    Current Outpatient Prescriptions  Medication Sig Dispense Refill  . aspirin EC 325 MG EC tablet Take 1 tablet (325 mg total) by mouth 2 (two) times daily after a meal. 40 tablet 0  . diclofenac (VOLTAREN) 75 MG EC tablet     . ferrous sulfate 325 (65 FE) MG tablet Take 1 tablet (325 mg total) by mouth 3 (three) times daily after meals. 90 tablet 0  . Multiple Vitamins-Minerals (CENTRUM MEN) TABS Take 1 tablet by mouth daily.    Marland Kitchen oxyCODONE-acetaminophen (ROXICET) 5-325 MG per tablet Take 1-2 tablets by mouth every 4 (four) hours as needed for severe pain. 40 tablet 0  . pantoprazole (PROTONIX) 40 MG tablet Take 1 tablet (40 mg total) by mouth daily. 30 tablet 11  . traMADol (ULTRAM) 50 MG tablet      No  current facility-administered medications for this visit.    Allergies as of 09/24/2015  . (No Known Allergies)    Family History  Problem Relation Age of Onset  . Diabetes Sister   . Diabetes Mother   . Colon cancer Neg Hx     Social History   Social History  . Marital Status: Married    Spouse Name: N/A  . Number of Children: 5  . Years of Education: N/A   Occupational History  . retired    Social History Main Topics  . Smoking status: Never Smoker   .  Smokeless tobacco: Never Used  . Alcohol Use: Yes     Comment: 08/14/2013 "might have a drink q other holiday"  . Drug Use: No  . Sexual Activity: Not Currently   Other Topics Concern  . Not on file   Social History Narrative     Physical Exam: BP 140/80 mmHg  Pulse 76  Ht 5' 7.5" (1.715 m)  Wt 238 lb 3.2 oz (108.047 kg)  BMI 36.74 kg/m2 Constitutional: generally well-appearing Psychiatric: alert and oriented x3 Eyes: extraocular movements intact Mouth: oral pharynx moist, no lesions Neck: supple no lymphadenopathy Cardiovascular: heart regular rate and rhythm Lungs: clear to auscultation bilaterally Abdomen: soft, nontender, nondistended, no obvious ascites, no peritoneal signs, normal bowel sounds Extremities: no lower extremity edema bilaterally Skin: no lesions on visible extremities   Assessment and plan: 70 y.o. male with  elevated liver tests    the etiology of his liver tests is unclear. He does have an elevated AMA level but his alkaline phosphatase is completely normal and I think given that fact it is pretty unlikely that he indeed has underlying PBC. 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 I think this is a good change in the name since many if not most able with PBC not have underlying cirrhosis. I would like to do a battery of blood tests for him as well as an abdominal ultrasound looking at his liver. Seems more likely he has underlying fatty liver disease. He is obese and has been obese for very long time. After one tests and imaging studies are reviewed we will contact him with a further plan. See the liver tests explained below inpatient instructions     Owens Loffler, MD [facility] 09/24/2015, 9:13 AM  Cc: Lona Kettle, MD

## 2015-09-24 NOTE — Patient Instructions (Addendum)
You will get labs drawn today: total iron, ferritin, TIBC, ANA, AMA, anti smooth muscle antibody, TTG, total IgA level, CBC, CMET, INR. You will be set up for an ultrasound of liver for elevated liver tests.  You have been scheduled for an abdominal ultrasound at [facility] (1st floor of hospital) on 09/26/15 at 8:30 am. Please arrive 15 minutes prior to your appointment for registration. Make certain not to have anything to eat or drink 6 hours prior to your appointment. Should you need to reschedule your appointment, please contact radiology at [phone]. This test typically takes about 30 minutes to perform.

## 2015-09-25 LAB — TISSUE TRANSGLUTAMINASE, IGA: TISSUE TRANSGLUTAMINASE AB, IGA: 1 U/mL (ref <4)

## 2015-09-25 LAB — ANA: ANA: NEGATIVE

## 2015-09-25 LAB — MITOCHONDRIAL ANTIBODIES: Mitochondrial M2 Ab, IgG: 24.3 Units — ABNORMAL HIGH (ref <=20.0)

## 2015-09-26 ENCOUNTER — Ambulatory Visit (HOSPITAL_COMMUNITY)
Admission: RE | Admit: 2015-09-26 | Discharge: 2015-09-26 | Disposition: A | Discharge status: Home / Self Care | Payer: Medicare Other | Source: Ambulatory Visit | Attending: Gastroenterology | Admitting: Gastroenterology

## 2015-09-26 DIAGNOSIS — R7989 Other specified abnormal findings of blood chemistry: Secondary | ICD-10-CM | POA: Diagnosis not present

## 2015-09-26 DIAGNOSIS — K317 Polyp of stomach and duodenum: Secondary | ICD-10-CM | POA: Insufficient documentation

## 2015-09-26 DIAGNOSIS — K2990 Gastroduodenitis, unspecified, without bleeding: Secondary | ICD-10-CM | POA: Insufficient documentation

## 2015-09-26 DIAGNOSIS — R932 Abnormal findings on diagnostic imaging of liver and biliary tract: Secondary | ICD-10-CM | POA: Insufficient documentation

## 2015-09-26 DIAGNOSIS — R945 Abnormal results of liver function studies: Secondary | ICD-10-CM

## 2015-09-27 LAB — ANTI-SMOOTH MUSCLE ANTIBODY, IGG: SMOOTH MUSCLE AB: 56 U — AB (ref <20)

## 2015-10-02 ENCOUNTER — Other Ambulatory Visit: Payer: Self-pay

## 2015-10-02 DIAGNOSIS — R748 Abnormal levels of other serum enzymes: Secondary | ICD-10-CM

## 2015-12-02 ENCOUNTER — Telehealth: Payer: Self-pay

## 2015-12-02 NOTE — Telephone Encounter (Signed)
Lab reminder letter mailed 

## 2015-12-02 NOTE — Telephone Encounter (Signed)
-----   Message from Barron Alvine, RN sent at 10/02/2015 10:14 AM EST ----- Pt to get labs in 2 months

## 2015-12-09 ENCOUNTER — Other Ambulatory Visit (INDEPENDENT_AMBULATORY_CARE_PROVIDER_SITE_OTHER): Payer: Medicare Other

## 2015-12-09 DIAGNOSIS — R945 Abnormal results of liver function studies: Principal | ICD-10-CM

## 2015-12-09 DIAGNOSIS — R7989 Other specified abnormal findings of blood chemistry: Secondary | ICD-10-CM

## 2015-12-09 DIAGNOSIS — R748 Abnormal levels of other serum enzymes: Secondary | ICD-10-CM

## 2015-12-09 LAB — HEPATIC FUNCTION PANEL
ALBUMIN: 3.9 g/dL (ref 3.5–5.2)
ALT: 32 U/L (ref 0–53)
AST: 22 U/L (ref 0–37)
Alkaline Phosphatase: 82 U/L (ref 39–117)
BILIRUBIN TOTAL: 1 mg/dL (ref 0.2–1.2)
Bilirubin, Direct: 0.1 mg/dL (ref 0.0–0.3)
Total Protein: 7.4 g/dL (ref 6.0–8.3)

## 2015-12-09 LAB — PROTIME-INR
INR: 1.2 ratio — AB (ref 0.8–1.0)
Prothrombin Time: 12.5 s (ref 9.6–13.1)

## 2015-12-11 ENCOUNTER — Other Ambulatory Visit: Payer: Self-pay

## 2015-12-11 DIAGNOSIS — R945 Abnormal results of liver function studies: Principal | ICD-10-CM

## 2015-12-11 DIAGNOSIS — R7989 Other specified abnormal findings of blood chemistry: Secondary | ICD-10-CM

## 2016-03-03 ENCOUNTER — Other Ambulatory Visit (INDEPENDENT_AMBULATORY_CARE_PROVIDER_SITE_OTHER): Payer: Medicare Other

## 2016-03-03 DIAGNOSIS — R7989 Other specified abnormal findings of blood chemistry: Secondary | ICD-10-CM | POA: Diagnosis not present

## 2016-03-03 DIAGNOSIS — R945 Abnormal results of liver function studies: Principal | ICD-10-CM

## 2016-03-03 LAB — HEPATIC FUNCTION PANEL
ALBUMIN: 4.1 g/dL (ref 3.5–5.2)
ALT: 25 U/L (ref 0–53)
AST: 21 U/L (ref 0–37)
Alkaline Phosphatase: 81 U/L (ref 39–117)
BILIRUBIN TOTAL: 1 mg/dL (ref 0.2–1.2)
Bilirubin, Direct: 0.1 mg/dL (ref 0.0–0.3)
Total Protein: 7.6 g/dL (ref 6.0–8.3)

## 2016-03-06 ENCOUNTER — Ambulatory Visit (INDEPENDENT_AMBULATORY_CARE_PROVIDER_SITE_OTHER): Payer: Medicare Other | Admitting: Gastroenterology

## 2016-03-06 ENCOUNTER — Encounter: Payer: Self-pay | Admitting: Gastroenterology

## 2016-03-06 VITALS — BP 150/88 | HR 76 | Ht 67.0 in | Wt 236.2 lb

## 2016-03-06 DIAGNOSIS — R7989 Other specified abnormal findings of blood chemistry: Secondary | ICD-10-CM

## 2016-03-06 DIAGNOSIS — R945 Abnormal results of liver function studies: Principal | ICD-10-CM

## 2016-03-06 NOTE — Progress Notes (Signed)
Review of pertinent gastrointestinal problems: 1. Melena, anemia: EGD, Ardis Hughs 08/2013: There was mild, diffuse non-specific gastritis. Biopsies were taken from antrum and sent to pathology. There was a fleshy, 69mm polyp along greater curve of gastric body. This polyp bled with minimal trauma from scope and it was removed with snare/cautery. The examination was otherwise normal. Had been taking meloxicam daily. Biopseis showed no H. Pylori, polyp was HP. Was put on BID PP. 2. Adenomatous colon polyps, colonoscopy Dr. Ardis Hughs 09/2013 done for routine risk screening, 3 subcentimeter polyps were removed. He was also found to have left-sided diverticulosis. He was recommended to have recall colonoscopy at 3 year interval. 3. Elevated liver tests, incidental: labs 09/2015: AMA elevated (equivocal), ASMA +, celiac panel negative, iron studies normal; INR normal, Pltes normal.; Labs 11 2016; ALTs was 162, AST was 76, indirect bilirubin was 0.9, direct bilirubin was 0.2, alkaline phosphatase was 83, his sedimentation rate was 17, hepatitis B surface antigen was negative, hepatitis A antibody IgM was negative, hepatitis B core antibody IgM was negative, hepatitis C virus was negative, AMA level was elevated at 30.3 hepatitis B surface antigen was negative, hepatitis be core antibody total was negative; [place] 09/2015: 80mm GB polyp, "mildly heterogeneous hepatic echotexture likely reflects fatty infiltrative change"  Lfts normalized throughout 2017 (02/2016 completely normal)  HPI: This is a very pleasant 71 year old man whom I last saw about 5 months ago.  Chief complaint is elevated liver tests  Since his last visit I have been following his liver tests and they have completely normalized. He has had no abdominal pains, no jaundice. No significant illnesses of any kind.  Has been losing weight, working out 3 times per week.  Eating better.  WEight is down 2 pounds since    Past Medical History:  Diagnosis Date  .  Arthritis   . Bleeding per rectum 08/2013   "black and bright red" (08/14/2013)  . Hypercholesteremia   . Obesity   . Prostate cancer ([place]) 2011    Past Surgical History:  Procedure Laterality Date  . ESOPHAGOGASTRODUODENOSCOPY N/A 08/15/2013   Procedure: ESOPHAGOGASTRODUODENOSCOPY (EGD);  Surgeon: Milus Banister, MD;  Location: [place];  Service: Endoscopy;  Laterality: N/A;  . INGUINAL HERNIA REPAIR Bilateral ~ 2004  . ROBOT ASSISTED LAPAROSCOPIC RADICAL PROSTATECTOMY  08/2009   [name] 08/14/2009 (08/14/2013)  . TOTAL HIP ARTHROPLASTY Left 11/14/2013   Procedure: LEFT TOTAL HIP ARTHROPLASTY ANTERIOR APPROACH;  Surgeon: Mcarthur Rossetti, MD;  Location: [place];  Service: Orthopedics;  Laterality: Left;    Current Outpatient Prescriptions  Medication Sig Dispense Refill  . aspirin EC 325 MG EC tablet Take 1 tablet (325 mg total) by mouth 2 (two) times daily after a meal. 40 tablet 0  . meloxicam (MOBIC) 7.5 MG tablet Take 7.5 mg by mouth daily. Once to twice daily as needed for pain    . Multiple Vitamins-Minerals (CENTRUM MEN) TABS Take 1 tablet by mouth daily.    . traMADol (ULTRAM) 50 MG tablet Take 100 mg by mouth as needed.      No current facility-administered medications for this visit.     Allergies as of 03/06/2016  . (No Known Allergies)    Family History  Problem Relation Age of Onset  . Diabetes Sister   . Diabetes Mother   . Colon cancer Neg Hx     Social History   Social History  . Marital status: Married    Spouse name: N/A  . Number of children: 5  .  Years of education: N/A   Occupational History  . retired    Social History Main Topics  . Smoking status: Never Smoker  . Smokeless tobacco: Never Used  . Alcohol use Yes     Comment: 08/14/2013 "might have a drink q other holiday"  . Drug use: No  . Sexual activity: Not Currently   Other Topics Concern  . Not on file   Social History Narrative  . No narrative on file     Physical Exam: Ht  5\' 7"  (1.702 m) Comment: height measured without shoes  Wt 236 lb 4 oz (107.2 kg)   BMI 37.00 kg/m  Constitutional: generally well-appearing Psychiatric: alert and oriented x3 Abdomen: soft, nontender, nondistended, no obvious ascites, no peritoneal signs, normal bowel sounds   Assessment and plan: 70 y.o. male with Transient elevated liver tests  His liver tests have completely normalized over the past 5 months. Perhaps some of this is related to his weight loss however he only really has lost 2 pounds. Possibly it is from his increased exercise. His AMA and his anti-smooth muscle antibodies were both elevated several months ago. Given other wise completely normal liver tests I see no reason to look into that further for now. I'm going to have him get a repeat set of liver tests in 6 months at that time he will also get CBC, coags, repeat AMA and repeat anti-smooth muscle antibody.   Owens Loffler, MD [facility] 03/06/2016, 8:39 AM

## 2016-03-06 NOTE — Patient Instructions (Signed)
Labs in 6 months (cmet, cbc, inr, AMA, ASMA). Continue losing weight.

## 2016-06-01 ENCOUNTER — Other Ambulatory Visit (INDEPENDENT_AMBULATORY_CARE_PROVIDER_SITE_OTHER): Payer: Self-pay | Admitting: Orthopaedic Surgery

## 2016-06-01 NOTE — Telephone Encounter (Signed)
Received call from pt daughter needing refills on Meloxicam sent to [facility] please call [phone]

## 2016-06-02 NOTE — Telephone Encounter (Signed)
Called into pharmacy

## 2016-08-20 ENCOUNTER — Other Ambulatory Visit (INDEPENDENT_AMBULATORY_CARE_PROVIDER_SITE_OTHER): Payer: Self-pay | Admitting: Orthopaedic Surgery

## 2016-08-24 ENCOUNTER — Encounter: Payer: Self-pay | Admitting: Gastroenterology

## 2016-09-01 ENCOUNTER — Encounter: Payer: Self-pay | Admitting: Gastroenterology

## 2016-10-13 ENCOUNTER — Ambulatory Visit (AMBULATORY_SURGERY_CENTER): Payer: Self-pay | Admitting: *Deleted

## 2016-10-13 ENCOUNTER — Encounter: Payer: Self-pay | Admitting: Gastroenterology

## 2016-10-13 VITALS — Ht 67.5 in | Wt 248.8 lb

## 2016-10-13 DIAGNOSIS — Z86010 Personal history of colonic polyps: Secondary | ICD-10-CM

## 2016-10-13 MED ORDER — NA SULFATE-K SULFATE-MG SULF 17.5-3.13-1.6 GM/177ML PO SOLN: ORAL | 0 refills | Status: DC

## 2016-10-13 NOTE — Progress Notes (Signed)
Pt denies allergies to eggs or soy products. Denies difficulty with sedation or anesthesia. Denies any diet or weight loss medications. Denies use of supplemental oxygen.  Emmi instructions denied by patient. 

## 2016-10-27 ENCOUNTER — Encounter: Payer: Self-pay | Admitting: Gastroenterology

## 2016-10-27 ENCOUNTER — Ambulatory Visit (AMBULATORY_SURGERY_CENTER): Payer: Medicare Other | Admitting: Gastroenterology

## 2016-10-27 VITALS — BP 140/77 | HR 67 | Temp 97.7°F | Resp 28 | Ht 67.0 in | Wt 248.0 lb

## 2016-10-27 DIAGNOSIS — Z86010 Personal history of colonic polyps: Secondary | ICD-10-CM | POA: Diagnosis not present

## 2016-10-27 DIAGNOSIS — D123 Benign neoplasm of transverse colon: Secondary | ICD-10-CM

## 2016-10-27 DIAGNOSIS — K5730 Diverticulosis of large intestine without perforation or abscess without bleeding: Secondary | ICD-10-CM | POA: Diagnosis not present

## 2016-10-27 MED ORDER — SODIUM CHLORIDE 0.9 % IV SOLN: 500.0000 mL | INTRAVENOUS | Status: DC

## 2016-10-27 NOTE — Progress Notes (Signed)
Pt's states no medical or surgical changes since previsit or office visit. 

## 2016-10-27 NOTE — Progress Notes (Signed)
Called to room to assist during endoscopic procedure.  Patient ID and intended procedure confirmed with present staff. Received instructions for my participation in the procedure from the performing physician.  

## 2016-10-27 NOTE — Progress Notes (Signed)
Report to PACU, RN, vss, BBS= Clear.  

## 2016-10-27 NOTE — Patient Instructions (Signed)
YOU HAD AN ENDOSCOPIC PROCEDURE TODAY AT THE [facility]:   Refer to the procedure report that was given to you for any specific questions about what was found during the examination.  If the procedure report does not answer your questions, please call your gastroenterologist to clarify.  If you requested that your care partner not be given the details of your procedure findings, then the procedure report has been included in a sealed envelope for you to review at your convenience later.  YOU SHOULD EXPECT: Some feelings of bloating in the abdomen. Passage of more gas than usual.  Walking can help get rid of the air that was put into your GI tract during the procedure and reduce the bloating. If you had a lower endoscopy (such as a colonoscopy or flexible sigmoidoscopy) you may notice spotting of blood in your stool or on the toilet paper. If you underwent a bowel prep for your procedure, you may not have a normal bowel movement for a few days.  Please Note:  You might notice some irritation and congestion in your nose or some drainage.  This is from the oxygen used during your procedure.  There is no need for concern and it should clear up in a day or so.  SYMPTOMS TO REPORT IMMEDIATELY:   Following lower endoscopy (colonoscopy or flexible sigmoidoscopy):  Excessive amounts of blood in the stool  Significant tenderness or worsening of abdominal pains  Swelling of the abdomen that is new, acute  Fever of 100F or higher   For urgent or emergent issues, a gastroenterologist can be reached at any hour by calling [phone]. Please read all handouts given to you by your recovery nurse.   DIET:  We do recommend a small meal at first, but then you may proceed to your regular diet.  Drink plenty of fluids but you should avoid alcoholic beverages for 24 hours.  ACTIVITY:  You should plan to take it easy for the rest of today and you should NOT DRIVE or use heavy machinery until  tomorrow (because of the sedation medicines used during the test).    FOLLOW UP: Our staff will call the number listed on your records the next business day following your procedure to check on you and address any questions or concerns that you may have regarding the information given to you following your procedure. If we do not reach you, we will leave a message.  However, if you are feeling well and you are not experiencing any problems, there is no need to return our call.  We will assume that you have returned to your regular daily activities without incident.  If any biopsies were taken you will be contacted by phone or by letter within the next 1-3 weeks.  Please call us at [phone] if you have not heard about the biopsies in 3 weeks.    SIGNATURES/CONFIDENTIALITY: You and/or your care partner have signed paperwork which will be entered into your electronic medical record.  These signatures attest to the fact that that the information above on your After Visit Summary has been reviewed and is understood.  Full responsibility of the confidentiality of this discharge information lies with you and/or your care-partner.  Thank you for letting us take care of your healthcare needs today. 

## 2016-10-27 NOTE — Op Note (Signed)
[facility] Patient Name: Lance Rollins Procedure Date: 10/27/2016 9:52 AM MRN: [id number] Endoscopist: Milus Banister , MD Age: 71 Referring MD:  Date of Birth: September 09, 1945 Gender: Male Account #: [id number] Procedure:                Colonoscopy Indications:              High risk colon cancer surveillance: Personal                            history of colonic polyps: Adenomatous colon polyps                            (HRA),colonoscopy Dr. Ardis Hughs 09/2013 done for                            routine risk screening, 3 subcentimeter polyps were                            removed. He was also found to have left-sided                            diverticulosis. He was recommended to have recall                            colonoscopy at 3 year interval Medicines:                Monitored Anesthesia Care Procedure:                Pre-Anesthesia Assessment:                           - Prior to the procedure, a History and Physical                            was performed, and patient medications and                            allergies were reviewed. The patient's tolerance of                            previous anesthesia was also reviewed. The risks                            and benefits of the procedure and the sedation                            options and risks were discussed with the patient.                            All questions were answered, and informed consent                            was obtained. Prior Anticoagulants: The patient has  taken no previous anticoagulant or antiplatelet                            agents. ASA Grade Assessment: II - A patient with                            mild systemic disease. After reviewing the risks                            and benefits, the patient was deemed in                            satisfactory condition to undergo the procedure.                           After obtaining informed consent, the  colonoscope                            was passed under direct vision. Throughout the                            procedure, the patient's blood pressure, pulse, and                            oxygen saturations were monitored continuously. The                            Colonoscope was introduced through the anus and                            advanced to the the cecum, identified by                            appendiceal orifice and ileocecal valve. The                            colonoscopy was performed without difficulty. The                            patient tolerated the procedure well. The quality                            of the bowel preparation was good. The ileocecal                            valve, appendiceal orifice, and rectum were                            photographed. Scope In: 9:54:56 AM Scope Out: 10:09:54 AM Scope Withdrawal Time: 0 hours 8 minutes 54 seconds  Total Procedure Duration: 0 hours 14 minutes 58 seconds  Findings:                 A 4 mm polyp was found in the proximal transverse  colon. The polyp was sessile. The polyp was removed                            with a cold snare. Resection and retrieval were                            complete.                           Multiple small and large-mouthed diverticula were                            found in the entire colon.                           The exam was otherwise without abnormality on                            direct and retroflexion views. Complications:            No immediate complications. Estimated blood loss:                            None. Estimated Blood Loss:     Estimated blood loss: none. Impression:               - One 4 mm polyp in the proximal transverse colon,                            removed with a cold snare. Resected and retrieved.                           - Diverticulosis in the entire examined colon.                           - The examination  was otherwise normal on direct                            and retroflexion views. Recommendation:           - Patient has a contact number available for                            emergencies. The signs and symptoms of potential                            delayed complications were discussed with the                            patient. Return to normal activities tomorrow.                            Written discharge instructions were provided to the                            patient.                           -  Resume previous diet.                           - Continue present medications.                           You will receive a letter within 2-3 weeks with the                            pathology results and my final recommendations.                           If the polyp(s) is proven to be 'pre-cancerous' on                            pathology, you will need repeat colonoscopy in 5                            years. Milus Banister, MD 10/27/2016 10:13:02 AM This report has been signed electronically.

## 2016-10-28 ENCOUNTER — Telehealth: Payer: Self-pay

## 2016-10-28 NOTE — Telephone Encounter (Signed)
  Follow up Call-  Call back number 10/27/2016  Post procedure Call Back phone  # [phone]  Permission to leave phone message Yes  Some recent data might be hidden     Patient questions:  Do you have a fever, pain , or abdominal swelling? No. Pain Score  0 *  Have you tolerated food without any problems? No.  Have you been able to return to your normal activities? Yes.    Do you have any questions about your discharge instructions: Diet   No. Medications  No. Follow up visit  No.  Do you have questions or concerns about your Care? No.  Actions: * If pain score is 4 or above: No action needed, pain <4.

## 2016-11-02 ENCOUNTER — Encounter: Payer: Self-pay | Admitting: Gastroenterology

## 2016-11-24 ENCOUNTER — Other Ambulatory Visit (INDEPENDENT_AMBULATORY_CARE_PROVIDER_SITE_OTHER): Payer: Self-pay | Admitting: Orthopaedic Surgery

## 2016-12-11 ENCOUNTER — Ambulatory Visit (INDEPENDENT_AMBULATORY_CARE_PROVIDER_SITE_OTHER): Payer: Medicare Other

## 2016-12-11 ENCOUNTER — Ambulatory Visit (INDEPENDENT_AMBULATORY_CARE_PROVIDER_SITE_OTHER): Payer: Medicare Other | Admitting: Physician Assistant

## 2016-12-11 DIAGNOSIS — M25551 Pain in right hip: Secondary | ICD-10-CM

## 2016-12-11 DIAGNOSIS — M1611 Unilateral primary osteoarthritis, right hip: Secondary | ICD-10-CM

## 2016-12-11 MED ORDER — TRAMADOL HCL 50 MG PO TABS: 50.0000 mg | ORAL_TABLET | Freq: Four times a day (QID) | ORAL | 0 refills | Status: DC | PRN

## 2016-12-11 NOTE — Progress Notes (Signed)
Office Visit Note   Patient: Lance Rollins           Date of Birth: Jan 05, 1946           MRN: [id number] Visit Date: 12/11/2016              Requested by: Lance Cruel, MD [place], [place] [address] PCP: Lance Crutch, MD   Assessment & Plan: Visit Diagnoses:  1. Pain in right hip   2. Primary osteoarthritis of right hip     Plan: Due to patient's increased pain and failure for Mobic to help with his pain. He is given him some tramadol. He would like to proceed with a right total hip arthroplasty in the near future. However he like to discuss this with his wife. Therefore Lance Rollins's card was given he can call her to schedule the surgery. He understands the risks benefits of surgery as he is undergone a left total hip by Lance Rollins on 11/14/2013. Left hips doing well. Did review with him risks of surgery including but not limited to prolonged pain, worsening pain, wound healing complications, nerve, vessel injury, DVT/PE and, complications with anesthesia. Offered him an intra-articular injection in left hip he defers.  Follow-Up Instructions: Return if symptoms worsen or fail to improve.   Orders:  Orders Placed This Encounter  Procedures  . XR HIP UNILAT W OR W/O PELVIS 1V RIGHT   Meds ordered this encounter  Medications  . traMADol (ULTRAM) 50 MG tablet    Sig: Take 1 tablet (50 mg total) by mouth every 6 (six) hours as needed.    Dispense:  40 tablet    Refill:  0      Procedures: No procedures performed   Clinical Data: No additional findings.   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 States is having pain in the groin area. He does have a family reunion coming up in July feels that he will need a total hip arthroplasty on the right as he had on the left some 3 years ago by Lance Rollins. He states the meloxicam is no longer helping with the pain. He's  having difficulty with prolonged ambulation, donning shoes and socks. He's had no new injury to the hip. Review of Systems Denies any chest pain, shortness breath, fevers or chills. Otherwise please see history of present illness  Objective: Vital Signs: There were no vitals taken for this visit.  Physical Exam  Constitutional: He is oriented to person, place, and time. He appears well-developed and well-nourished. No distress.  Pulmonary/Chest: Effort normal.  Neurological: He is alert and oriented to person, place, and time.  Psychiatric: He has a normal mood and affect.    Ortho Exam Left hip good range of motion without pain. Right hip he has limited internal rotation with pain. Overall good external rotation with some discomfort. Leg lengths are equal. He ambulates without any assistive devices with a slight antalgic gait on the right. Specialty Comments:  No specialty comments available.  Imaging: Xr Hip Unilat W Or W/o Pelvis 1v Right  Result Date: 12/11/2016 AP pelvis and right lateral hip: Status post left total hip arthroplasty with well-seated components no acute fracture. No hardware failure. Right hip with endstage arthritis. No acute fractures of the right hip or hemipelvis.    PMFS History: Patient Active Problem List   Diagnosis Date Noted  . Arthritis  of left hip 11/14/2013  . Status post THR (total hip replacement) 11/14/2013  . Unspecified gastritis and gastroduodenitis without mention of hemorrhage 08/15/2013  . Gastric polyp 08/15/2013   Past Medical History:  Diagnosis Date  . Arthritis   . Bleeding per rectum 08/2013   "black and bright red" (08/14/2013)  . Hypercholesteremia   . Obesity   . Prostate cancer [facility]) 2011    Family History  Problem Relation Age of Onset  . Diabetes Sister   . Diabetes Mother   . Breast cancer Mother   . Colon cancer Neg Hx     Past Surgical History:  Procedure Laterality Date  . ESOPHAGOGASTRODUODENOSCOPY N/A  08/15/2013   Procedure: ESOPHAGOGASTRODUODENOSCOPY (EGD);  Surgeon: Lance Banister, MD;  Location: [place];  Service: Endoscopy;  Laterality: N/A;  . INGUINAL HERNIA REPAIR Bilateral ~ 2004  . ROBOT ASSISTED LAPAROSCOPIC RADICAL PROSTATECTOMY  08/2009   Lance Rollins 08/14/2009 (08/14/2013)  . TOTAL HIP ARTHROPLASTY Left 11/14/2013   Procedure: LEFT TOTAL HIP ARTHROPLASTY ANTERIOR APPROACH;  Surgeon: Lance Rossetti, MD;  Location: [place];  Service: Orthopedics;  Laterality: Left;   Social History   Occupational History  . retired    Social History Main Topics  . Smoking status: Never Smoker  . Smokeless tobacco: Never Used  . Alcohol use Yes     Comment: 08/14/2013 "might have a drink q other holiday"  . Drug use: No  . Sexual activity: Not Currently

## 2016-12-18 ENCOUNTER — Telehealth (INDEPENDENT_AMBULATORY_CARE_PROVIDER_SITE_OTHER): Payer: Self-pay | Admitting: Orthopaedic Surgery

## 2016-12-18 ENCOUNTER — Other Ambulatory Visit (INDEPENDENT_AMBULATORY_CARE_PROVIDER_SITE_OTHER): Payer: Self-pay

## 2016-12-18 DIAGNOSIS — M25551 Pain in right hip: Secondary | ICD-10-CM

## 2016-12-18 NOTE — Telephone Encounter (Signed)
Please advise 

## 2016-12-18 NOTE — Telephone Encounter (Signed)
Patient called advised the Tramadol is not working. Patient said he is still in a lot of pain and cannot sleep at night. The number to contact patient is [phone]

## 2016-12-18 NOTE — Telephone Encounter (Signed)
Patient aware of below message

## 2016-12-18 NOTE — Telephone Encounter (Signed)
Nothing else we can do to treat his right hip arthritis other than setting him up for a steroid injection in that hip by Dr. Ernestina Patches or a hip replacement.  No treatment of arthritis using narcotics

## 2016-12-27 ENCOUNTER — Other Ambulatory Visit (INDEPENDENT_AMBULATORY_CARE_PROVIDER_SITE_OTHER): Payer: Self-pay | Admitting: Physician Assistant

## 2016-12-28 ENCOUNTER — Ambulatory Visit (INDEPENDENT_AMBULATORY_CARE_PROVIDER_SITE_OTHER): Payer: Medicare Other

## 2016-12-28 ENCOUNTER — Encounter (INDEPENDENT_AMBULATORY_CARE_PROVIDER_SITE_OTHER): Payer: Self-pay | Admitting: Physical Medicine and Rehabilitation

## 2016-12-28 ENCOUNTER — Ambulatory Visit (INDEPENDENT_AMBULATORY_CARE_PROVIDER_SITE_OTHER): Payer: Medicare Other | Admitting: Physical Medicine and Rehabilitation

## 2016-12-28 VITALS — BP 159/96 | HR 78

## 2016-12-28 DIAGNOSIS — M25551 Pain in right hip: Secondary | ICD-10-CM | POA: Diagnosis not present

## 2016-12-28 NOTE — Telephone Encounter (Signed)
Please advise 

## 2016-12-28 NOTE — Progress Notes (Signed)
Lance Rollins - 71 y.o. male MRN [id number]  Date of birth: [birth year]  Office Visit Note: Visit Date: 12/28/2016 PCP: Lawerance Cruel, MD Referred by: Lawerance Cruel, MD  Subjective: Chief Complaint  Patient presents with  . Right Hip - Pain   HPI: Mr. Lance Rollins is a 71 year old gentleman the care of Dr. Ninfa Linden has had a prior left total hip replacement. His right hip is now problematic with pain in the groin and anterior lateral pain. He has a lot of difficulty reaching down to get his shoes and getting out of cars. He has some symptoms that are posterior lateral as well. He does have end-stage hip osteoarthritis. Within a complete a diagnostic and hopefully therapeutic anesthetic hip arthrogram.    ROS Otherwise per HPI.  Assessment & Plan: Visit Diagnoses:  1. Pain in right hip     Plan: Findings:  Diagnostic and therapeutic right hip anesthetic arthrogram. Patient had good relief during the anesthetic phase of the injection.    Meds & Orders: No orders of the defined types were placed in this encounter.   Orders Placed This Encounter  Procedures  . Large Joint Injection/Arthrocentesis  . XR C-ARM NO REPORT    Follow-up: No Follow-up on file.   Procedures: Large Joint Inj Date/Time: 12/28/2016 10:10 AM Performed by: Magnus Sinning Authorized by: Magnus Sinning   Consent Given by:  Patient Site marked: the procedure site was marked   Timeout: prior to procedure the correct patient, procedure, and site was verified   Indications:  Pain and diagnostic evaluation Location:  Hip Site:  R hip joint Prep: patient was prepped and draped in usual sterile fashion   Needle Size:  22 G Approach:  Anterior Ultrasound Guidance: No   Fluoroscopic Guidance: No   Arthrogram: Yes   Medications:  3 mL bupivacaine 0.5 %; 80 mg triamcinolone acetonide 40 MG/ML Aspiration Attempted: Yes   Patient tolerance:  Patient tolerated the procedure well with no immediate  complications  Arthrogram demonstrated excellent flow of contrast throughout the joint surface without extravasation or obvious defect but consistent joint space loss throughout.  The patient had relief of symptoms during the anesthetic phase of the injection.      No notes on file   Clinical History: No specialty comments available.  He reports that he has never smoked. He has never used smokeless tobacco. No results for input(s): HGBA1C, LABURIC in the last 8760 hours.  Objective:  VS:  HT:    WT:   BMI:     BP:(!) 159/96  HR:78bpm  TEMP: ( )  RESP:  Physical Exam  Musculoskeletal:  Patient has stiff motion of the right hip with concordant pain with rotation. He has good distal strength. He ambulates without aid.    Ortho Exam Imaging: Xr C-arm No Report  Result Date: 12/28/2016 Please see Notes or Procedures tab for imaging impression.   Past Medical/Family/Surgical/Social History: Medications & Allergies reviewed per EMR Patient Active Problem List   Diagnosis Date Noted  . Arthritis of left hip 11/14/2013  . Status post THR (total hip replacement) 11/14/2013  . Unspecified gastritis and gastroduodenitis without mention of hemorrhage 08/15/2013  . Gastric polyp 08/15/2013   Past Medical History:  Diagnosis Date  . Arthritis   . Bleeding per rectum 08/2013   "black and bright red" (08/14/2013)  . Hypercholesteremia   . Obesity   . Prostate cancer [facility]) 2011   Family History  Problem Relation Age of Onset  .  Diabetes Sister   . Diabetes Mother   . Breast cancer Mother   . Colon cancer Neg Hx    Past Surgical History:  Procedure Laterality Date  . ESOPHAGOGASTRODUODENOSCOPY N/A 08/15/2013   Procedure: ESOPHAGOGASTRODUODENOSCOPY (EGD);  Surgeon: Milus Banister, MD;  Location: [place];  Service: Endoscopy;  Laterality: N/A;  . INGUINAL HERNIA REPAIR Bilateral ~ 2004  . ROBOT ASSISTED LAPAROSCOPIC RADICAL PROSTATECTOMY  08/2009   [name] 08/14/2009 (08/14/2013)    . TOTAL HIP ARTHROPLASTY Left 11/14/2013   Procedure: LEFT TOTAL HIP ARTHROPLASTY ANTERIOR APPROACH;  Surgeon: Mcarthur Rossetti, MD;  Location: [place];  Service: Orthopedics;  Laterality: Left;   Social History   Occupational History  . retired    Social History Main Topics  . Smoking status: Never Smoker  . Smokeless tobacco: Never Used  . Alcohol use Yes     Comment: 08/14/2013 "might have a drink q other holiday"  . Drug use: No  . Sexual activity: Not Currently

## 2016-12-28 NOTE — Progress Notes (Deleted)
Right hip pain for 1 1/2 months. Constant pain. Denies groin pain.

## 2016-12-28 NOTE — Telephone Encounter (Signed)
Called into pharmacy

## 2016-12-29 MED ORDER — TRIAMCINOLONE ACETONIDE 40 MG/ML IJ SUSP
80.0000 mg | INTRAMUSCULAR | Status: AC | PRN
  Administered 2016-12-28: 80 mg via INTRA_ARTICULAR

## 2016-12-29 MED ORDER — BUPIVACAINE HCL 0.5 % IJ SOLN
3.0000 mL | INTRAMUSCULAR | Status: AC | PRN
  Administered 2016-12-28: 3 mL via INTRA_ARTICULAR

## 2016-12-29 NOTE — Patient Instructions (Signed)

## 2017-01-01 ENCOUNTER — Other Ambulatory Visit (INDEPENDENT_AMBULATORY_CARE_PROVIDER_SITE_OTHER): Payer: Self-pay | Admitting: Physician Assistant

## 2017-01-05 NOTE — Telephone Encounter (Signed)
Called into pharmacy

## 2017-01-05 NOTE — Telephone Encounter (Signed)
Please advise 

## 2017-01-21 ENCOUNTER — Telehealth (INDEPENDENT_AMBULATORY_CARE_PROVIDER_SITE_OTHER): Payer: Self-pay

## 2017-01-21 NOTE — Telephone Encounter (Signed)
Patient is requesting a refill of Tramadol.  Please [phone] call to advise.  Uses [facility] [place].

## 2017-01-21 NOTE — Telephone Encounter (Signed)
Please advise 

## 2017-01-22 ENCOUNTER — Other Ambulatory Visit (INDEPENDENT_AMBULATORY_CARE_PROVIDER_SITE_OTHER): Payer: Self-pay | Admitting: Physician Assistant

## 2017-01-22 ENCOUNTER — Other Ambulatory Visit (INDEPENDENT_AMBULATORY_CARE_PROVIDER_SITE_OTHER): Payer: Self-pay

## 2017-01-22 MED ORDER — TRAMADOL HCL 50 MG PO TABS: 50.0000 mg | ORAL_TABLET | Freq: Four times a day (QID) | ORAL | 0 refills | Status: DC

## 2017-01-22 NOTE — Telephone Encounter (Signed)
Ok to refill 

## 2017-01-22 NOTE — Telephone Encounter (Signed)
Called into pharmacy

## 2017-02-16 ENCOUNTER — Telehealth (INDEPENDENT_AMBULATORY_CARE_PROVIDER_SITE_OTHER): Payer: Self-pay

## 2017-02-16 MED ORDER — MELOXICAM 7.5 MG PO TABS: ORAL_TABLET | ORAL | 1 refills | Status: DC

## 2017-02-16 NOTE — Telephone Encounter (Signed)
Left voice mail requesting refill of Meloxicam.

## 2017-02-16 NOTE — Telephone Encounter (Signed)
I sent some in 

## 2017-02-16 NOTE — Telephone Encounter (Signed)
Refill on meloxicam

## 2017-03-10 ENCOUNTER — Telehealth (INDEPENDENT_AMBULATORY_CARE_PROVIDER_SITE_OTHER): Payer: Self-pay

## 2017-03-10 ENCOUNTER — Other Ambulatory Visit (INDEPENDENT_AMBULATORY_CARE_PROVIDER_SITE_OTHER): Payer: Self-pay | Admitting: Physician Assistant

## 2017-03-10 NOTE — Telephone Encounter (Signed)
Okay to refill tramadol 50 mg 1-2 every 8-12 hours as needed for pain #60 no refills.

## 2017-03-10 NOTE — Telephone Encounter (Signed)
Patient left voice mail requesting refill of Tramadol.

## 2017-03-10 NOTE — Telephone Encounter (Signed)
Please advise 

## 2017-03-11 ENCOUNTER — Other Ambulatory Visit (INDEPENDENT_AMBULATORY_CARE_PROVIDER_SITE_OTHER): Payer: Self-pay

## 2017-03-11 MED ORDER — TRAMADOL HCL 50 MG PO TABS: 50.0000 mg | ORAL_TABLET | Freq: Four times a day (QID) | ORAL | 0 refills | Status: DC | PRN

## 2017-03-11 NOTE — Telephone Encounter (Signed)
Called into pharmacy

## 2017-03-11 NOTE — Telephone Encounter (Signed)
Please advise 

## 2017-03-11 NOTE — Telephone Encounter (Signed)
About what?

## 2017-03-25 ENCOUNTER — Other Ambulatory Visit (INDEPENDENT_AMBULATORY_CARE_PROVIDER_SITE_OTHER): Payer: Self-pay | Admitting: Physician Assistant

## 2017-03-26 ENCOUNTER — Other Ambulatory Visit (INDEPENDENT_AMBULATORY_CARE_PROVIDER_SITE_OTHER): Payer: Self-pay | Admitting: Orthopaedic Surgery

## 2017-03-29 NOTE — Patient Instructions (Signed)
Lance Rollins  03/29/2017   Your procedure is scheduled on: 04/09/17  Report to [facility] Main  Entrance   Report to admitting at    0930 AM   Call this number if you have problems the morning of surgery  [phone]   Remember: ONLY 1 PERSON MAY GO WITH YOU TO SHORT STAY TO GET  READY MORNING OF [place].  Do not eat food or drink liquids :After Midnight.     Take these medicines the morning of surgery with A SIP OF WATER: none                                 You may not have any metal on your body including hair pins and              piercings  Do not wear jewelry, , lotions, powders or perfumes, deodorant                        Men may shave face and neck.   Do not bring valuables to the hospital. [place].  Contacts, dentures or bridgework may not be worn into surgery.  Leave suitcase in the car. After surgery it may be brought to your room.                   Please read over the following fact sheets you were given: _____________________________________________________________________             [facility] - Preparing for Surgery Before surgery, you can play an important role.  Because skin is not sterile, your skin needs to be as free of germs as possible.  You can reduce the number of germs on your skin by washing with CHG (chlorahexidine gluconate) soap before surgery.  CHG is an antiseptic cleaner which kills germs and bonds with the skin to continue killing germs even after washing. Please DO NOT use if you have an allergy to CHG or antibacterial soaps.  If your skin becomes reddened/irritated stop using the CHG and inform your nurse when you arrive at Short Stay. Do not shave (including legs and underarms) for at least 48 hours prior to the first CHG shower.  You may shave your face/neck. Please follow these instructions carefully:  1.  Shower with CHG Soap the night before surgery  and the  morning of Surgery.  2.  If you choose to wash your hair, wash your hair first as usual with your  normal  shampoo.  3.  After you shampoo, rinse your hair and body thoroughly to remove the  shampoo.                           4.  Use CHG as you would any other liquid soap.  You can apply chg directly  to the skin and wash                       Gently with a scrungie or clean washcloth.  5.  Apply the CHG Soap to your body ONLY FROM THE NECK DOWN.   Do not use on face/ open  Wound or open sores. Avoid contact with eyes, ears mouth and genitals (private parts).                       Wash face,  Genitals (private parts) with your normal soap.             6.  Wash thoroughly, paying special attention to the area where your surgery  will be performed.  7.  Thoroughly rinse your body with warm water from the neck down.  8.  DO NOT shower/wash with your normal soap after using and rinsing off  the CHG Soap.                9.  Pat yourself dry with a clean towel.            10.  Wear clean pajamas.            11.  Place clean sheets on your bed the night of your first shower and do not  sleep with pets. Day of Surgery : Do not apply any lotions/deodorants the morning of surgery.  Please wear clean clothes to the hospital/surgery center.  FAILURE TO FOLLOW THESE INSTRUCTIONS MAY RESULT IN THE CANCELLATION OF YOUR SURGERY PATIENT SIGNATURE_________________________________  NURSE SIGNATURE__________________________________  ________________________________________________________________________  WHAT IS A BLOOD TRANSFUSION? Blood Transfusion Information  A transfusion is the replacement of blood or some of its parts. Blood is made up of multiple cells which provide different functions.  Red blood cells carry oxygen and are used for blood loss replacement.  White blood cells fight against infection.  Platelets control bleeding.  Plasma helps clot  blood.  Other blood products are available for specialized needs, such as hemophilia or other clotting disorders. BEFORE THE TRANSFUSION  Who gives blood for transfusions?   Healthy volunteers who are fully evaluated to make sure their blood is safe. This is blood bank blood. Transfusion therapy is the safest it has ever been in the practice of medicine. Before blood is taken from a donor, a complete history is taken to make sure that person has no history of diseases nor engages in risky social behavior (examples are intravenous drug use or sexual activity with multiple partners). The donor's travel history is screened to minimize risk of transmitting infections, such as malaria. The donated blood is tested for signs of infectious diseases, such as HIV and hepatitis. The blood is then tested to be sure it is compatible with you in order to minimize the chance of a transfusion reaction. If you or a relative donates blood, this is often done in anticipation of surgery and is not appropriate for emergency situations. It takes many days to process the donated blood. RISKS AND COMPLICATIONS Although transfusion therapy is very safe and saves many lives, the main dangers of transfusion include:   Getting an infectious disease.  Developing a transfusion reaction. This is an allergic reaction to something in the blood you were given. Every precaution is taken to prevent this. The decision to have a blood transfusion has been considered carefully by your caregiver before blood is given. Blood is not given unless the benefits outweigh the risks. AFTER THE TRANSFUSION  Right after receiving a blood transfusion, you will usually feel much better and more energetic. This is especially true if your red blood cells have gotten low (anemic). The transfusion raises the level of the red blood cells which carry oxygen, and this usually causes an energy increase.  The  nurse administering the transfusion will monitor  you carefully for complications. HOME CARE INSTRUCTIONS  No special instructions are needed after a transfusion. You may find your energy is better. Speak with your caregiver about any limitations on activity for underlying diseases you may have. SEEK MEDICAL CARE IF:   Your condition is not improving after your transfusion.  You develop redness or irritation at the intravenous (IV) site. SEEK IMMEDIATE MEDICAL CARE IF:  Any of the following symptoms occur over the next 12 hours:  Shaking chills.  You have a temperature by mouth above 102 F (38.9 C), not controlled by medicine.  Chest, back, or muscle pain.  People around you feel you are not acting correctly or are confused.  Shortness of breath or difficulty breathing.  Dizziness and fainting.  You get a rash or develop hives.  You have a decrease in urine output.  Your urine turns a dark color or changes to pink, red, or brown. Any of the following symptoms occur over the next 10 days:  You have a temperature by mouth above 102 F (38.9 C), not controlled by medicine.  Shortness of breath.  Weakness after normal activity.  The white part of the eye turns yellow (jaundice).  You have a decrease in the amount of urine or are urinating less often.  Your urine turns a dark color or changes to pink, red, or brown. Document Released: 07/24/2000 Document Revised: 10/19/2011 Document Reviewed: 03/12/2008 ExitCare Patient Information 2014 [place].  _______________________________________________________________________  Incentive Spirometer  An incentive spirometer is a tool that can help keep your lungs clear and active. This tool measures how well you are filling your lungs with each breath. Taking long deep breaths may help reverse or decrease the chance of developing breathing (pulmonary) problems (especially infection) following:  A long period of time when you are unable to move or be active. BEFORE THE  PROCEDURE   If the spirometer includes an indicator to show your best effort, your nurse or respiratory therapist will set it to a desired goal.  If possible, sit up straight or lean slightly forward. Try not to slouch.  Hold the incentive spirometer in an upright position. INSTRUCTIONS FOR USE  1. Sit on the edge of your bed if possible, or sit up as far as you can in bed or on a chair. 2. Hold the incentive spirometer in an upright position. 3. Breathe out normally. 4. Place the mouthpiece in your mouth and seal your lips tightly around it. 5. Breathe in slowly and as deeply as possible, raising the piston or the ball toward the top of the column. 6. Hold your breath for 3-5 seconds or for as long as possible. Allow the piston or ball to fall to the bottom of the column. 7. Remove the mouthpiece from your mouth and breathe out normally. 8. Rest for a few seconds and repeat Steps 1 through 7 at least 10 times every 1-2 hours when you are awake. Take your time and take a few normal breaths between deep breaths. 9. The spirometer may include an indicator to show your best effort. Use the indicator as a goal to work toward during each repetition. 10. After each set of 10 deep breaths, practice coughing to be sure your lungs are clear. If you have an incision (the cut made at the time of surgery), support your incision when coughing by placing a pillow or rolled up towels firmly against it. Once you are able to get  out of bed, walk around indoors and cough well. You may stop using the incentive spirometer when instructed by your caregiver.  RISKS AND COMPLICATIONS  Take your time so you do not get dizzy or light-headed.  If you are in pain, you may need to take or ask for pain medication before doing incentive spirometry. It is harder to take a deep breath if you are having pain. AFTER USE  Rest and breathe slowly and easily.  It can be helpful to keep track of a log of your progress. Your  caregiver can provide you with a simple table to help with this. If you are using the spirometer at home, follow these instructions: [place] IF:   You are having difficultly using the spirometer.  You have trouble using the spirometer as often as instructed.  Your pain medication is not giving enough relief while using the spirometer.  You develop fever of 100.5 F (38.1 C) or higher. SEEK IMMEDIATE MEDICAL CARE IF:   You cough up bloody sputum that had not been present before.  You develop fever of 102 F (38.9 C) or greater.  You develop worsening pain at or near the incision site. MAKE SURE YOU:   Understand these instructions.  Will watch your condition.  Will get help right away if you are not doing well or get worse. Document Released: 12/07/2006 Document Revised: 10/19/2011 Document Reviewed: 02/07/2007 [employer] Patient Information 2014 [place], [place].   ________________________________________________________________________

## 2017-03-30 ENCOUNTER — Encounter (HOSPITAL_COMMUNITY)
Admission: RE | Admit: 2017-03-30 | Discharge: 2017-03-30 | Disposition: A | Discharge status: Home / Self Care | Payer: Medicare Other | Source: Ambulatory Visit | Attending: Orthopaedic Surgery | Admitting: Orthopaedic Surgery

## 2017-04-07 ENCOUNTER — Other Ambulatory Visit (INDEPENDENT_AMBULATORY_CARE_PROVIDER_SITE_OTHER): Payer: Self-pay | Admitting: Orthopaedic Surgery

## 2017-04-08 ENCOUNTER — Other Ambulatory Visit (INDEPENDENT_AMBULATORY_CARE_PROVIDER_SITE_OTHER): Payer: Self-pay | Admitting: Orthopaedic Surgery

## 2017-04-08 ENCOUNTER — Telehealth (INDEPENDENT_AMBULATORY_CARE_PROVIDER_SITE_OTHER): Payer: Self-pay | Admitting: Orthopaedic Surgery

## 2017-04-08 NOTE — Telephone Encounter (Signed)
It is okay to refill his tramadol. 50 mg. 1-2 every 8-12 hours as needed for pain. #60 with no refills.

## 2017-04-08 NOTE — Telephone Encounter (Signed)
Called into pharmacy

## 2017-04-08 NOTE — Telephone Encounter (Signed)
Please advise 

## 2017-04-08 NOTE — Telephone Encounter (Signed)
Patient called needing Rx refilled (Tramadol) The number to contact patient is [phone] or [phone]

## 2017-04-16 ENCOUNTER — Encounter (HOSPITAL_COMMUNITY): Payer: Self-pay

## 2017-04-16 ENCOUNTER — Encounter (HOSPITAL_COMMUNITY)
Admission: RE | Admit: 2017-04-16 | Discharge: 2017-04-16 | Disposition: A | Discharge status: Home / Self Care | Payer: Medicare Other | Source: Ambulatory Visit | Attending: Orthopaedic Surgery | Admitting: Orthopaedic Surgery

## 2017-04-16 DIAGNOSIS — M1611 Unilateral primary osteoarthritis, right hip: Secondary | ICD-10-CM | POA: Diagnosis not present

## 2017-04-16 DIAGNOSIS — Z01812 Encounter for preprocedural laboratory examination: Secondary | ICD-10-CM | POA: Diagnosis present

## 2017-04-16 LAB — CBC
HCT: 40.1 % (ref 39.0–52.0)
Hemoglobin: 13.3 g/dL (ref 13.0–17.0)
MCH: 27.5 pg (ref 26.0–34.0)
MCHC: 33.2 g/dL (ref 30.0–36.0)
MCV: 83 fL (ref 78.0–100.0)
PLATELETS: 351 10*3/uL (ref 150–400)
RBC: 4.83 MIL/uL (ref 4.22–5.81)
RDW: 15 % (ref 11.5–15.5)
WBC: 7.3 10*3/uL (ref 4.0–10.5)

## 2017-04-16 LAB — SURGICAL PCR SCREEN
MRSA, PCR: NEGATIVE
Staphylococcus aureus: NEGATIVE

## 2017-04-16 NOTE — Patient Instructions (Addendum)
Lance Rollins  04/16/2017   Your procedure is scheduled on: 04-23-17   Report to [facility] Main  Entrance Report to Admitting at 8:30 AM   Call this number if you have problems the morning of surgery  [phone]   Remember: ONLY 1 PERSON MAY GO WITH YOU TO SHORT STAY TO GET  READY MORNING OF [place].  Do not eat food or drink liquids :After Midnight.     Take these medicines the morning of surgery with A SIP OF WATER: None                                You may not have any metal on your body including hair pins and              piercings  Do not wear jewelry, make-up, lotions, powders or perfumes, deodorant             Men may shave face and neck.   Do not bring valuables to the hospital. Lance Rollins.  Contacts, dentures or bridgework may not be worn into surgery.  Leave suitcase in the car. After surgery it may be brought to your room.                 Please read over the following fact sheets you were given: _____________________________________________________________________             [facility] - Preparing for Surgery Before surgery, you can play an important role.  Because skin is not sterile, your skin needs to be as free of germs as possible.  You can reduce the number of germs on your skin by washing with CHG (chlorahexidine gluconate) soap before surgery.  CHG is an antiseptic cleaner which kills germs and bonds with the skin to continue killing germs even after washing. Please DO NOT use if you have an allergy to CHG or antibacterial soaps.  If your skin becomes reddened/irritated stop using the CHG and inform your nurse when you arrive at Short Stay. Do not shave (including legs and underarms) for at least 48 hours prior to the first CHG shower.  You may shave your face/neck. Please follow these instructions carefully:  1.  Shower with CHG Soap the night before surgery and the  morning  of Surgery.  2.  If you choose to wash your hair, wash your hair first as usual with your  normal  shampoo.  3.  After you shampoo, rinse your hair and body thoroughly to remove the  shampoo.                           4.  Use CHG as you would any other liquid soap.  You can apply chg directly  to the skin and wash                       Gently with a scrungie or clean washcloth.  5.  Apply the CHG Soap to your body ONLY FROM THE NECK DOWN.   Do not use on face/ open  Wound or open sores. Avoid contact with eyes, ears mouth and genitals (private parts).                       Wash face,  Genitals (private parts) with your normal soap.             6.  Wash thoroughly, paying special attention to the area where your surgery  will be performed.  7.  Thoroughly rinse your body with warm water from the neck down.  8.  DO NOT shower/wash with your normal soap after using and rinsing off  the CHG Soap.                9.  Pat yourself dry with a clean towel.            10.  Wear clean pajamas.            11.  Place clean sheets on your bed the night of your first shower and do not  sleep with pets. Day of Surgery : Do not apply any lotions/deodorants the morning of surgery.  Please wear clean clothes to the hospital/surgery center.  FAILURE TO FOLLOW THESE INSTRUCTIONS MAY RESULT IN THE CANCELLATION OF YOUR SURGERY PATIENT SIGNATURE_________________________________  NURSE SIGNATURE__________________________________  ________________________________________________________________________   Lance Rollins  An incentive spirometer is a tool that can help keep your lungs clear and active. This tool measures how well you are filling your lungs with each breath. Taking long deep breaths may help reverse or decrease the chance of developing breathing (pulmonary) problems (especially infection) following:  A long period of time when you are unable to move or be  active. BEFORE THE PROCEDURE   If the spirometer includes an indicator to show your best effort, your nurse or respiratory therapist will set it to a desired goal.  If possible, sit up straight or lean slightly forward. Try not to slouch.  Hold the incentive spirometer in an upright position. INSTRUCTIONS FOR USE  1. Sit on the edge of your bed if possible, or sit up as far as you can in bed or on a chair. 2. Hold the incentive spirometer in an upright position. 3. Breathe out normally. 4. Place the mouthpiece in your mouth and seal your lips tightly around it. 5. Breathe in slowly and as deeply as possible, raising the piston or the ball toward the top of the column. 6. Hold your breath for 3-5 seconds or for as long as possible. Allow the piston or ball to fall to the bottom of the column. 7. Remove the mouthpiece from your mouth and breathe out normally. 8. Rest for a few seconds and repeat Steps 1 through 7 at least 10 times every 1-2 hours when you are awake. Take your time and take a few normal breaths between deep breaths. 9. The spirometer may include an indicator to show your best effort. Use the indicator as a goal to work toward during each repetition. 10. After each set of 10 deep breaths, practice coughing to be sure your lungs are clear. If you have an incision (the cut made at the time of surgery), support your incision when coughing by placing a pillow or rolled up towels firmly against it. Once you are able to get out of bed, walk around indoors and cough well. You may stop using the incentive spirometer when instructed by your caregiver.  RISKS AND COMPLICATIONS  Take your time so you do not get  dizzy or light-headed.  If you are in pain, you may need to take or ask for pain medication before doing incentive spirometry. It is harder to take a deep breath if you are having pain. AFTER USE  Rest and breathe slowly and easily.  It can be helpful to keep track of a log of  your progress. Your caregiver can provide you with a simple table to help with this. If you are using the spirometer at home, follow these instructions: [place] IF:   You are having difficultly using the spirometer.  You have trouble using the spirometer as often as instructed.  Your pain medication is not giving enough relief while using the spirometer.  You develop fever of 100.5 F (38.1 C) or higher. SEEK IMMEDIATE MEDICAL CARE IF:   You cough up bloody sputum that had not been present before.  You develop fever of 102 F (38.9 C) or greater.  You develop worsening pain at or near the incision site. MAKE SURE YOU:   Understand these instructions.  Will watch your condition.  Will get help right away if you are not doing well or get worse. Document Released: 12/07/2006 Document Revised: 10/19/2011 Document Reviewed: 02/07/2007 [facility] Patient Information 2014 [place], [place].   ________________________________________________________________________

## 2017-04-19 ENCOUNTER — Other Ambulatory Visit (INDEPENDENT_AMBULATORY_CARE_PROVIDER_SITE_OTHER): Payer: Self-pay | Admitting: Orthopaedic Surgery

## 2017-04-19 NOTE — Telephone Encounter (Signed)
Refill request

## 2017-04-22 ENCOUNTER — Inpatient Hospital Stay (INDEPENDENT_AMBULATORY_CARE_PROVIDER_SITE_OTHER): Payer: Medicare Other | Admitting: Orthopaedic Surgery

## 2017-04-23 ENCOUNTER — Inpatient Hospital Stay (HOSPITAL_COMMUNITY)
Admission: RE | Admit: 2017-04-23 | Discharge: 2017-04-25 | DRG: 470 | Disposition: A | Discharge status: Home / Self Care | Payer: Medicare Other | Source: Ambulatory Visit | Attending: Orthopaedic Surgery | Admitting: Orthopaedic Surgery

## 2017-04-23 ENCOUNTER — Encounter (HOSPITAL_COMMUNITY): Payer: Self-pay | Admitting: Certified Registered"

## 2017-04-23 ENCOUNTER — Inpatient Hospital Stay (HOSPITAL_COMMUNITY): Payer: Medicare Other

## 2017-04-23 ENCOUNTER — Encounter (HOSPITAL_COMMUNITY)
Admission: RE | Disposition: A | Discharge status: Home / Self Care | Payer: Self-pay | Source: Ambulatory Visit | Attending: Orthopaedic Surgery

## 2017-04-23 ENCOUNTER — Inpatient Hospital Stay (HOSPITAL_COMMUNITY): Payer: Medicare Other | Admitting: Anesthesiology

## 2017-04-23 DIAGNOSIS — Z6836 Body mass index (BMI) 36.0-36.9, adult: Secondary | ICD-10-CM

## 2017-04-23 DIAGNOSIS — Z7982 Long term (current) use of aspirin: Secondary | ICD-10-CM

## 2017-04-23 DIAGNOSIS — Z96641 Presence of right artificial hip joint: Secondary | ICD-10-CM

## 2017-04-23 DIAGNOSIS — Z8546 Personal history of malignant neoplasm of prostate: Secondary | ICD-10-CM | POA: Diagnosis not present

## 2017-04-23 DIAGNOSIS — Z791 Long term (current) use of non-steroidal anti-inflammatories (NSAID): Secondary | ICD-10-CM | POA: Diagnosis not present

## 2017-04-23 DIAGNOSIS — M25551 Pain in right hip: Secondary | ICD-10-CM

## 2017-04-23 DIAGNOSIS — E7800 Pure hypercholesterolemia, unspecified: Secondary | ICD-10-CM | POA: Diagnosis present

## 2017-04-23 DIAGNOSIS — Z79899 Other long term (current) drug therapy: Secondary | ICD-10-CM | POA: Diagnosis not present

## 2017-04-23 DIAGNOSIS — E669 Obesity, unspecified: Secondary | ICD-10-CM | POA: Diagnosis not present

## 2017-04-23 DIAGNOSIS — Z96642 Presence of left artificial hip joint: Secondary | ICD-10-CM | POA: Diagnosis present

## 2017-04-23 DIAGNOSIS — M1611 Unilateral primary osteoarthritis, right hip: Secondary | ICD-10-CM | POA: Diagnosis not present

## 2017-04-23 HISTORY — DX: Unilateral primary osteoarthritis, right hip: M16.11

## 2017-04-23 HISTORY — PX: TOTAL HIP ARTHROPLASTY: SHX124

## 2017-04-23 SURGERY — ARTHROPLASTY, HIP, TOTAL, ANTERIOR APPROACH
Anesthesia: Spinal | Site: Hip | Laterality: Right

## 2017-04-23 MED ORDER — DEXAMETHASONE SODIUM PHOSPHATE 10 MG/ML IJ SOLN
INTRAMUSCULAR | Status: AC
  Filled 2017-04-23: qty 1

## 2017-04-23 MED ORDER — PHENYLEPHRINE 40 MCG/ML (10ML) SYRINGE FOR IV PUSH (FOR BLOOD PRESSURE SUPPORT)
PREFILLED_SYRINGE | INTRAVENOUS | Status: DC | PRN
  Administered 2017-04-23 (×5): 80 ug via INTRAVENOUS

## 2017-04-23 MED ORDER — PHENOL 1.4 % MT LIQD: 1.0000 | OROMUCOSAL | Status: DC | PRN

## 2017-04-23 MED ORDER — STERILE WATER FOR IRRIGATION IR SOLN
Status: DC | PRN
Start: 2017-04-23 — End: 2017-04-23
  Administered 2017-04-23: 2000 mL

## 2017-04-23 MED ORDER — POLYETHYLENE GLYCOL 3350 17 G PO PACK
17.0000 g | PACK | Freq: Every day | ORAL | Status: DC | PRN
  Administered 2017-04-25: 08:00:00 17 g via ORAL
  Filled 2017-04-23: qty 1

## 2017-04-23 MED ORDER — METOCLOPRAMIDE HCL 5 MG PO TABS: 5.0000 mg | ORAL_TABLET | Freq: Three times a day (TID) | ORAL | Status: DC | PRN

## 2017-04-23 MED ORDER — 0.9 % SODIUM CHLORIDE (POUR BTL) OPTIME
TOPICAL | Status: DC | PRN
Start: 2017-04-23 — End: 2017-04-23
  Administered 2017-04-23: 1000 mL

## 2017-04-23 MED ORDER — ADULT MULTIVITAMIN W/MINERALS CH
1.0000 | ORAL_TABLET | Freq: Every day | ORAL | Status: DC
  Administered 2017-04-24 – 2017-04-25 (×2): 1 via ORAL
  Filled 2017-04-23 (×2): qty 1

## 2017-04-23 MED ORDER — ACETAMINOPHEN 325 MG PO TABS: 650.0000 mg | ORAL_TABLET | Freq: Four times a day (QID) | ORAL | Status: DC | PRN

## 2017-04-23 MED ORDER — MEPERIDINE HCL 50 MG/ML IJ SOLN: 6.2500 mg | INTRAMUSCULAR | Status: DC | PRN

## 2017-04-23 MED ORDER — ONDANSETRON HCL 4 MG/2ML IJ SOLN
INTRAMUSCULAR | Status: AC
  Filled 2017-04-23: qty 2

## 2017-04-23 MED ORDER — CEFAZOLIN SODIUM-DEXTROSE 1-4 GM/50ML-% IV SOLN
1.0000 g | Freq: Four times a day (QID) | INTRAVENOUS | Status: AC
  Administered 2017-04-23 (×2): 1 g via INTRAVENOUS
  Filled 2017-04-23 (×2): qty 50

## 2017-04-23 MED ORDER — MIDAZOLAM HCL 2 MG/2ML IJ SOLN
INTRAMUSCULAR | Status: AC
  Filled 2017-04-23: qty 2

## 2017-04-23 MED ORDER — HYDROMORPHONE HCL-NACL 0.5-0.9 MG/ML-% IV SOSY: 1.0000 mg | PREFILLED_SYRINGE | INTRAVENOUS | Status: DC | PRN

## 2017-04-23 MED ORDER — EPHEDRINE SULFATE-NACL 50-0.9 MG/10ML-% IV SOSY
PREFILLED_SYRINGE | INTRAVENOUS | Status: DC | PRN
  Administered 2017-04-23: 10 mg via INTRAVENOUS
  Administered 2017-04-23: 5 mg via INTRAVENOUS
  Administered 2017-04-23 (×3): 10 mg via INTRAVENOUS
  Administered 2017-04-23: 5 mg via INTRAVENOUS

## 2017-04-23 MED ORDER — LACTATED RINGERS IV SOLN
INTRAVENOUS | Status: DC
  Administered 2017-04-23 (×3): via INTRAVENOUS

## 2017-04-23 MED ORDER — PROPOFOL 10 MG/ML IV BOLUS
INTRAVENOUS | Status: AC
  Filled 2017-04-23: qty 20

## 2017-04-23 MED ORDER — ASPIRIN EC 325 MG PO TBEC
325.0000 mg | DELAYED_RELEASE_TABLET | Freq: Two times a day (BID) | ORAL | Status: DC
  Administered 2017-04-24 – 2017-04-25 (×4): 325 mg via ORAL
  Filled 2017-04-23 (×4): qty 1

## 2017-04-23 MED ORDER — GABAPENTIN 100 MG PO CAPS
100.0000 mg | ORAL_CAPSULE | Freq: Three times a day (TID) | ORAL | Status: DC
  Administered 2017-04-23 – 2017-04-25 (×6): 100 mg via ORAL
  Filled 2017-04-23 (×6): qty 1

## 2017-04-23 MED ORDER — METOCLOPRAMIDE HCL 5 MG/ML IJ SOLN: 5.0000 mg | Freq: Three times a day (TID) | INTRAMUSCULAR | Status: DC | PRN

## 2017-04-23 MED ORDER — ACETAMINOPHEN 650 MG RE SUPP: 650.0000 mg | Freq: Four times a day (QID) | RECTAL | Status: DC | PRN

## 2017-04-23 MED ORDER — DEXAMETHASONE SODIUM PHOSPHATE 10 MG/ML IJ SOLN
INTRAMUSCULAR | Status: DC | PRN
  Administered 2017-04-23: 10 mg via INTRAVENOUS

## 2017-04-23 MED ORDER — TRANEXAMIC ACID 1000 MG/10ML IV SOLN
1000.0000 mg | INTRAVENOUS | Status: AC
  Administered 2017-04-23: 1000 mg via INTRAVENOUS
  Filled 2017-04-23: qty 1100

## 2017-04-23 MED ORDER — SODIUM CHLORIDE 0.9 % IR SOLN
Status: DC | PRN
  Administered 2017-04-23: 1000 mL

## 2017-04-23 MED ORDER — CEFAZOLIN SODIUM-DEXTROSE 2-4 GM/100ML-% IV SOLN
2.0000 g | INTRAVENOUS | Status: AC
  Administered 2017-04-23: 2 g via INTRAVENOUS

## 2017-04-23 MED ORDER — PHENYLEPHRINE HCL 10 MG/ML IJ SOLN
INTRAVENOUS | Status: DC | PRN
  Administered 2017-04-23: 20 ug/min via INTRAVENOUS

## 2017-04-23 MED ORDER — CEFAZOLIN SODIUM-DEXTROSE 2-4 GM/100ML-% IV SOLN
INTRAVENOUS | Status: AC
  Filled 2017-04-23: qty 100

## 2017-04-23 MED ORDER — DOCUSATE SODIUM 100 MG PO CAPS
100.0000 mg | ORAL_CAPSULE | Freq: Two times a day (BID) | ORAL | Status: DC
  Administered 2017-04-23 – 2017-04-25 (×4): 100 mg via ORAL
  Filled 2017-04-23 (×4): qty 1

## 2017-04-23 MED ORDER — SODIUM CHLORIDE 0.9 % IV SOLN
INTRAVENOUS | Status: DC
  Administered 2017-04-23: 16:00:00 via INTRAVENOUS

## 2017-04-23 MED ORDER — LIDOCAINE 2% (20 MG/ML) 5 ML SYRINGE
INTRAMUSCULAR | Status: AC
  Filled 2017-04-23: qty 5

## 2017-04-23 MED ORDER — FENTANYL CITRATE (PF) 100 MCG/2ML IJ SOLN
INTRAMUSCULAR | Status: DC | PRN
  Administered 2017-04-23: 100 ug via INTRAVENOUS

## 2017-04-23 MED ORDER — ONDANSETRON HCL 4 MG/2ML IJ SOLN: 4.0000 mg | Freq: Four times a day (QID) | INTRAMUSCULAR | Status: DC | PRN

## 2017-04-23 MED ORDER — DIPHENHYDRAMINE HCL 12.5 MG/5ML PO ELIX: 12.5000 mg | ORAL_SOLUTION | ORAL | Status: DC | PRN

## 2017-04-23 MED ORDER — METHOCARBAMOL 1000 MG/10ML IJ SOLN
500.0000 mg | Freq: Four times a day (QID) | INTRAVENOUS | Status: DC | PRN
  Administered 2017-04-23: 500 mg via INTRAVENOUS
  Filled 2017-04-23: qty 550

## 2017-04-23 MED ORDER — FENTANYL CITRATE (PF) 100 MCG/2ML IJ SOLN: 25.0000 ug | INTRAMUSCULAR | Status: DC | PRN

## 2017-04-23 MED ORDER — CHLORHEXIDINE GLUCONATE 4 % EX LIQD: 60.0000 mL | Freq: Once | CUTANEOUS | Status: DC

## 2017-04-23 MED ORDER — MIDAZOLAM HCL 5 MG/5ML IJ SOLN
INTRAMUSCULAR | Status: DC | PRN
  Administered 2017-04-23: 2 mg via INTRAVENOUS

## 2017-04-23 MED ORDER — ONDANSETRON HCL 4 MG PO TABS: 4.0000 mg | ORAL_TABLET | Freq: Four times a day (QID) | ORAL | Status: DC | PRN

## 2017-04-23 MED ORDER — BUPIVACAINE HCL (PF) 0.5 % IJ SOLN
INTRAMUSCULAR | Status: AC
  Filled 2017-04-23: qty 30

## 2017-04-23 MED ORDER — ALUM & MAG HYDROXIDE-SIMETH 200-200-20 MG/5ML PO SUSP: 30.0000 mL | ORAL | Status: DC | PRN

## 2017-04-23 MED ORDER — OXYCODONE HCL 5 MG PO TABS
5.0000 mg | ORAL_TABLET | ORAL | Status: DC | PRN
  Administered 2017-04-23 – 2017-04-24 (×6): 10 mg via ORAL
  Administered 2017-04-25: 5 mg via ORAL
  Administered 2017-04-25 (×2): 10 mg via ORAL
  Filled 2017-04-23: qty 2
  Filled 2017-04-23: qty 1
  Filled 2017-04-23 (×7): qty 2

## 2017-04-23 MED ORDER — LIDOCAINE 2% (20 MG/ML) 5 ML SYRINGE
INTRAMUSCULAR | Status: DC | PRN
  Administered 2017-04-23: 50 mg via INTRAVENOUS

## 2017-04-23 MED ORDER — MENTHOL 3 MG MT LOZG: 1.0000 | LOZENGE | OROMUCOSAL | Status: DC | PRN

## 2017-04-23 MED ORDER — PROPOFOL 500 MG/50ML IV EMUL
INTRAVENOUS | Status: DC | PRN
  Administered 2017-04-23: 100 ug/kg/min via INTRAVENOUS

## 2017-04-23 MED ORDER — BUPIVACAINE HCL (PF) 0.5 % IJ SOLN
INTRAMUSCULAR | Status: DC | PRN
  Administered 2017-04-23: 3 mL

## 2017-04-23 MED ORDER — METHOCARBAMOL 500 MG PO TABS
500.0000 mg | ORAL_TABLET | Freq: Four times a day (QID) | ORAL | Status: DC | PRN
  Administered 2017-04-24 – 2017-04-25 (×3): 500 mg via ORAL
  Filled 2017-04-23 (×3): qty 1

## 2017-04-23 MED ORDER — FENTANYL CITRATE (PF) 100 MCG/2ML IJ SOLN
INTRAMUSCULAR | Status: AC
  Filled 2017-04-23: qty 2

## 2017-04-23 MED ORDER — PROPOFOL 10 MG/ML IV BOLUS
INTRAVENOUS | Status: AC
Start: 2017-04-23 — End: ?
  Filled 2017-04-23: qty 20

## 2017-04-23 MED ORDER — ONDANSETRON HCL 4 MG/2ML IJ SOLN
INTRAMUSCULAR | Status: DC | PRN
  Administered 2017-04-23: 4 mg via INTRAVENOUS

## 2017-04-23 SURGICAL SUPPLY — 39 items
APL SKNCLS STERI-STRIP NONHPOA (GAUZE/BANDAGES/DRESSINGS)
BAG SPEC THK2 15X12 ZIP CLS (MISCELLANEOUS)
BAG ZIPLOCK 12X15 (MISCELLANEOUS) IMPLANT
BENZOIN TINCTURE PRP APPL 2/3 (GAUZE/BANDAGES/DRESSINGS) IMPLANT
BLADE SAW SGTL 18X1.27X75 (BLADE) ×2 IMPLANT
BLADE SAW SGTL 18X1.27X75MM (BLADE) ×1
CAPT HIP TOTAL 2 ×2 IMPLANT
CELLS DAT CNTRL 66122 CELL SVR (MISCELLANEOUS) ×1 IMPLANT
CLOSURE WOUND 1/2 X4 (GAUZE/BANDAGES/DRESSINGS)
COVER PERINEAL POST (MISCELLANEOUS) ×3 IMPLANT
COVER SURGICAL LIGHT HANDLE (MISCELLANEOUS) ×3 IMPLANT
DRAPE STERI IOBAN 125X83 (DRAPES) ×3 IMPLANT
DRAPE U-SHAPE 47X51 STRL (DRAPES) ×6 IMPLANT
DRESSING AQUACEL AG SP 3.5X10 (GAUZE/BANDAGES/DRESSINGS) IMPLANT
DRSG AQUACEL AG ADV 3.5X10 (GAUZE/BANDAGES/DRESSINGS) ×3 IMPLANT
DRSG AQUACEL AG SP 3.5X10 (GAUZE/BANDAGES/DRESSINGS) ×3
DURAPREP 26ML APPLICATOR (WOUND CARE) ×3 IMPLANT
ELECT REM PT RETURN 15FT ADLT (MISCELLANEOUS) ×3 IMPLANT
GAUZE XEROFORM 1X8 LF (GAUZE/BANDAGES/DRESSINGS) ×2 IMPLANT
GLOVE BIO SURGEON STRL SZ7.5 (GLOVE) ×3 IMPLANT
GLOVE BIOGEL PI IND STRL 8 (GLOVE) ×2 IMPLANT
GLOVE BIOGEL PI INDICATOR 8 (GLOVE) ×4
GLOVE ECLIPSE 8.0 STRL XLNG CF (GLOVE) ×3 IMPLANT
GOWN STRL REUS W/TWL XL LVL3 (GOWN DISPOSABLE) ×6 IMPLANT
HANDPIECE INTERPULSE COAX TIP (DISPOSABLE) ×3
HOLDER FOLEY CATH W/STRAP (MISCELLANEOUS) ×3 IMPLANT
PACK ANTERIOR HIP CUSTOM (KITS) ×3 IMPLANT
RTRCTR WOUND ALEXIS 18CM MED (MISCELLANEOUS) ×3
SET HNDPC FAN SPRY TIP SCT (DISPOSABLE) ×1 IMPLANT
STAPLER VISISTAT 35W (STAPLE) ×2 IMPLANT
STRIP CLOSURE SKIN 1/2X4 (GAUZE/BANDAGES/DRESSINGS) IMPLANT
SUT ETHIBOND NAB CT1 #1 30IN (SUTURE) ×3 IMPLANT
SUT MNCRL AB 4-0 PS2 18 (SUTURE) IMPLANT
SUT VIC AB 0 CT1 36 (SUTURE) ×3 IMPLANT
SUT VIC AB 1 CT1 36 (SUTURE) ×3 IMPLANT
SUT VIC AB 2-0 CT1 27 (SUTURE) ×6
SUT VIC AB 2-0 CT1 TAPERPNT 27 (SUTURE) ×2 IMPLANT
TRAY FOLEY W/METER SILVER 16FR (SET/KITS/TRAYS/PACK) ×3 IMPLANT
YANKAUER SUCT BULB TIP 10FT TU (MISCELLANEOUS) ×3 IMPLANT

## 2017-04-23 NOTE — Anesthesia Procedure Notes (Signed)
Spinal  Patient location during procedure: OR End time: 04/23/2017 10:26 AM Staffing Resident/CRNA: Noralyn Pick D Performed: anesthesiologist  Preanesthetic Checklist Completed: patient identified, site marked, surgical consent, pre-op evaluation, timeout performed, IV checked, risks and benefits discussed and monitors and equipment checked Spinal Block Patient position: sitting Prep: Betadine Patient monitoring: heart rate, continuous pulse ox and blood pressure Approach: midline Location: L2-3 Injection technique: single-shot Needle Needle type: Sprotte  Needle gauge: 24 G Needle length: 9 cm Assessment Sensory level: T6 Additional Notes Expiration date of kit checked and confirmed. Patient tolerated procedure well, without complications.

## 2017-04-23 NOTE — Transfer of Care (Signed)
Immediate Anesthesia Transfer of Care Note  Patient: Lance Rollins  Procedure(s) Performed: Procedure(s): RIGHT TOTAL HIP ARTHROPLASTY ANTERIOR APPROACH (Right)  Patient Location: PACU  Anesthesia Type:MAC and Spinal  Level of Consciousness: Patient easily awoken, sedated, comfortable, cooperative, following commands, responds to stimulation.   Airway & Oxygen Therapy: Patient spontaneously breathing, ventilating well, oxygen via simple oxygen mask.  Post-op Assessment: Report given to PACU RN, vital signs reviewed and stable.   Post vital signs: Reviewed and stable.  Complications: No apparent anesthesia complications  Last Vitals:  Vitals:   04/23/17 0846  BP: (!) 155/83  Pulse: 80  Resp: 18  Temp: 36.4 C  SpO2: 96%    Last Pain:  Vitals:   04/23/17 0846  TempSrc: Oral         Complications: No apparent anesthesia complications

## 2017-04-23 NOTE — Brief Op Note (Signed)
04/23/2017  11:50 AM  PATIENT:  Barbaraann Share  71 y.o. male  PRE-OPERATIVE DIAGNOSIS:  right hip osteoarthritis  POST-OPERATIVE DIAGNOSIS:  right hip osteoarthritis  PROCEDURE:  Procedure(s): RIGHT TOTAL HIP ARTHROPLASTY ANTERIOR APPROACH (Right)  SURGEON:  Surgeon(s) and Role:    Mcarthur Rossetti, MD - Primary  PHYSICIAN ASSISTANT: Benita Stabile, PA-C  ANESTHESIA:   spinal  EBL:  Total I/O In: 2000 [I.V.:2000] Out: 350 [Urine:200; Blood:150]  COUNTS:  YES\ DICTATION: .Other Dictation: Dictation Number [id number]  PLAN OF CARE: Admit to inpatient   PATIENT DISPOSITION:  PACU - hemodynamically stable.   Delay start of Pharmacological VTE agent (>24hrs) due to surgical blood loss or risk of bleeding: no

## 2017-04-23 NOTE — H&P (Signed)
TOTAL HIP ADMISSION H&P  Patient is admitted for right total hip arthroplasty.  Subjective:  Chief Complaint: right hip pain  HPI: Lance Rollins, 71 y.o. male, has a history of pain and functional disability in the right hip(s) due to arthritis and patient has failed non-surgical conservative treatments for greater than 12 weeks to include NSAID's and/or analgesics, corticosteriod injections, flexibility and strengthening excercises, use of assistive devices, weight reduction as appropriate and activity modification.  Onset of symptoms was gradual starting 2 years ago with gradually worsening course since that time.The patient noted no past surgery on the right hip(s).  Patient currently rates pain in the right hip at 10 out of 10 with activity. Patient has night pain, worsening of pain with activity and weight bearing, trendelenberg gait, pain that interfers with activities of daily living, pain with passive range of motion and crepitus. Patient has evidence of subchondral cysts, subchondral sclerosis, periarticular osteophytes and joint space narrowing by imaging studies. This condition presents safety issues increasing the risk of falls.  There is no current active infection.  Patient Active Problem List   Diagnosis Date Noted  . Unilateral primary osteoarthritis, right hip 04/23/2017  . Arthritis of left hip 11/14/2013  . Status post THR (total hip replacement) 11/14/2013  . Unspecified gastritis and gastroduodenitis without mention of hemorrhage 08/15/2013  . Gastric polyp 08/15/2013   Past Medical History:  Diagnosis Date  . Arthritis   . Bleeding per rectum 08/2013   "black and bright red" (08/14/2013)  . Hypercholesteremia   . Obesity   . Prostate cancer ([employer]) 2011  . Unilateral primary osteoarthritis, right hip 04/23/2017    Past Surgical History:  Procedure Laterality Date  . ESOPHAGOGASTRODUODENOSCOPY N/A 08/15/2013   Procedure: ESOPHAGOGASTRODUODENOSCOPY (EGD);  Surgeon: Milus Banister, MD;  Location: [place];  Service: Endoscopy;  Laterality: N/A;  . INGUINAL HERNIA REPAIR Bilateral ~ 2004  . ROBOT ASSISTED LAPAROSCOPIC RADICAL PROSTATECTOMY  08/2009   [name] 08/14/2009 (08/14/2013)  . TOTAL HIP ARTHROPLASTY Left 11/14/2013   Procedure: LEFT TOTAL HIP ARTHROPLASTY ANTERIOR APPROACH;  Surgeon: Mcarthur Rossetti, MD;  Location: [place];  Service: Orthopedics;  Laterality: Left;    Facility-Administered Medications Prior to Admission  Medication Dose Route Frequency Provider Last Rate Last Dose  . 0.9 %  sodium chloride infusion  500 mL Intravenous Continuous Milus Banister, MD       Prescriptions Prior to Admission  Medication Sig Dispense Refill Last Dose  . aspirin EC 325 MG EC tablet Take 1 tablet (325 mg total) by mouth 2 (two) times daily after a meal. (Patient taking differently: Take 325 mg by mouth daily. ) 40 tablet 0 10/27/2016  . Multiple Vitamin (MULTIVITAMIN WITH MINERALS) TABS tablet Take 1 tablet by mouth daily.     . meloxicam (MOBIC) 7.5 MG tablet TAKE 1 ONCE DAILY TO TWICE DAILY AS NEEDED FOR PAIN OR INFLAMMATION 60 tablet 1   . traMADol (ULTRAM) 50 MG tablet TAKE 1 TO 2 TABLETS BY MOUTH EVERY 8 TO 12 HOURS AS NEEDED FOR PAIN 60 tablet 0    No Known Allergies  Social History  Substance Use Topics  . Smoking status: Never Smoker  . Smokeless tobacco: Never Used  . Alcohol use Yes     Comment: 08/14/2013 "might have a drink q other holiday"    Family History  Problem Relation Age of Onset  . Diabetes Sister   . Diabetes Mother   . Breast cancer Mother   .  Colon cancer Neg Hx      Review of Systems  Musculoskeletal: Positive for joint pain.  All other systems reviewed and are negative.   Objective:  Physical Exam  Constitutional: He is oriented to person, place, and time. He appears well-developed and well-nourished.  HENT:  Head: Normocephalic and atraumatic.  Eyes: Pupils are equal, round, and reactive to light. EOM are normal.   Neck: Normal range of motion. Neck supple.  Cardiovascular: Normal rate and regular rhythm.   Respiratory: Effort normal and breath sounds normal.  GI: Soft. Bowel sounds are normal.  Musculoskeletal:       Right hip: He exhibits decreased range of motion, decreased strength, tenderness and bony tenderness.  Neurological: He is alert and oriented to person, place, and time.  Skin: Skin is warm and dry.  Psychiatric: He has a normal mood and affect.    Vital signs in last 24 hours: Temp:  [97.6 F (36.4 C)] 97.6 F (36.4 C) (09/14 0846) Pulse Rate:  [80] 80 (09/14 0846) Resp:  [18] 18 (09/14 0846) BP: (155)/(83) 155/83 (09/14 0846) SpO2:  [96 %] 96 % (09/14 0846)  Labs:   Estimated body mass index is 37.02 kg/m as calculated from the following:   Height as of 04/16/17: 5\' 8"  (1.727 m).   Weight as of 04/16/17: 243 lb 8 oz (110.5 kg).   Imaging Review Plain radiographs demonstrate severe degenerative joint disease of the right hip(s). The bone quality appears to be good for age and reported activity level.  Assessment/Plan:  End stage arthritis, right hip(s)  The patient history, physical examination, clinical judgement of the provider and imaging studies are consistent with end stage degenerative joint disease of the right hip(s) and total hip arthroplasty is deemed medically necessary. The treatment options including medical management, injection therapy, arthroscopy and arthroplasty were discussed at length. The risks and benefits of total hip arthroplasty were presented and reviewed. The risks due to aseptic loosening, infection, stiffness, dislocation/subluxation,  thromboembolic complications and other imponderables were discussed.  The patient acknowledged the explanation, agreed to proceed with the plan and consent was signed. Patient is being admitted for inpatient treatment for surgery, pain control, PT, OT, prophylactic antibiotics, VTE prophylaxis, progressive ambulation  and ADL's and discharge planning.The patient is planning to be discharged home with home health services

## 2017-04-23 NOTE — Anesthesia Preprocedure Evaluation (Signed)
Anesthesia Evaluation  Patient identified by MRN, date of birth, ID band Patient awake    Reviewed: Allergy & Precautions, H&P , NPO status , Patient's Chart, lab work & pertinent test results  Airway Mallampati: II  TM Distance: <3 FB Neck ROM: Full    Dental  (+) Dental Advisory Given, Missing,    Pulmonary    breath sounds clear to auscultation       Cardiovascular  Rhythm:Regular Rate:Normal     Neuro/Psych    GI/Hepatic   Endo/Other    Renal/GU      Musculoskeletal  (+) Arthritis ,   Abdominal (+) + obese,   Peds  Hematology   Anesthesia Other Findings H/o prostate ca  Reproductive/Obstetrics                             Anesthesia Physical  Anesthesia Plan  ASA: III  Anesthesia Plan: Spinal   Post-op Pain Management:    Induction:   PONV Risk Score and Plan: 2 and Ondansetron, Dexamethasone, Treatment may vary due to age or medical condition, Midazolam and Propofol infusion  Airway Management Planned: Nasal Cannula, Natural Airway, Mask and Simple Face Mask  Additional Equipment:   Intra-op Plan:   Post-operative Plan: Extubation in OR  Informed Consent: I have reviewed the patients History and Physical, chart, labs and discussed the procedure including the risks, benefits and alternatives for the proposed anesthesia with the patient or authorized representative who has indicated his/her understanding and acceptance.   Dental advisory given  Plan Discussed with: CRNA and Surgeon  Anesthesia Plan Comments: (  )        Anesthesia Quick Evaluation

## 2017-04-23 NOTE — Op Note (Signed)
NAME:  CLEE, PANDIT                     ACCOUNT NO.:  MEDICAL RECORD NO.:  [id number]  LOCATION:                                 FACILITY:  PHYSICIAN:  Lind Guest. Ninfa Linden, M.D.DATE OF BIRTH:  DATE OF PROCEDURE:  04/23/2017 DATE OF DISCHARGE:                              OPERATIVE REPORT   PREOPERATIVE DIAGNOSIS:  Severe primary osteoarthritis, degenerative joint disease, right hip.  POSTOPERATIVE DIAGNOSIS:  Severe primary osteoarthritis, degenerative joint disease, right hip.  PROCEDURE:  Right total hip arthroplasty through direct anterior approach.  IMPLANTS:  DePuy Sector Gription acetabular component size 54, 1 screw, size 36 +4 polyethylene liner, size 10 Corail femoral component with varus offset, size 36 +1.5 ceramic hip ball.  SURGEON:  Mcarthur Rossetti, MD.  ASSISTANT:  Erskine Emery, PA-C.  ANESTHESIA:  Spinal.  ANTIBIOTICS:  IV Ancef 2 g.  BLOOD LOSS:  Less than 500 mL.  COMPLICATIONS:  None.  INDICATIONS:  Mr. Santagata is a 71 year old gentleman well known to me.  He has debilitating arthritis involving both his hips and underwent a successful left total hip arthroplasty in 2015.  His right hip pain is daily and has detrimentally affected his activities of daily living, his quality of life, and his mobility.  He has 10/10 pain.  He has tried and failed all forms of conservative treatment including intra-articular injections.  The x-ray showed femoral head flattening with complete loss of the joint space and severe sclerotic changes of periarticular osteophytes.  At this point with failure of conservative treatment, he does wish to proceed with total hip arthroplasty.  He fully understands the risks of acute blood loss anemia, nerve and vessel injury, fracture, infection, dislocation, and DVT.  He understands our goals are to decrease pain, improve mobility, and overall improved quality of life.  PROCEDURE DESCRIPTION:  After informed consent was  obtained, appropriate right hip was marked.  He was brought to the operating room, where spinal anesthesia was obtained while he was on a stretcher.  He was laid supine on a stretcher.  Foley catheter was placed and both feet had traction boots applied to them.  Next, he was placed supine on the Hana fracture table, the perineal post in place and both legs in InLine skeletal traction device, but no traction applied.  His right operative hip was prepped and draped with DuraPrep and sterile drapes.  Time-out was called and he was identified as correct patient and correct right hip.  I then made an incision just inferior and posterior to the anterior superior iliac spine and carried this obliquely down the leg. We dissected down to tensor fascia lata muscle.  Tensor fascia was then divided longitudinally to proceed with a direct anterior approach to the hip.  We identified and cauterized circumflex vessels and then identified the hip capsule.  I opened up the hip capsule in an L-type format, finding a large joint effusion and significant periarticular osteophytes.  We placed Cobra retractors around the medial and lateral femoral neck and then made our femoral neck with an oscillating saw under direct visualization and completed this on osteotome.  We placed a corkscrew guide in  the femoral head and removed the femoral head in its entirety and found to be completely devoid of cartilage.  I then placed a bent Hohmann over the medial acetabular rim and cleaned remnants of the acetabular labrum and other debris from the acetabulum.  We then began reaming under direct visualization from a size 43 reamer going in stepwise increments up to a size 54 with all reamers under direct visualization and the last reamer under direct fluoroscopy, so we could obtain our depth of reaming, our inclination, and anteversion.  Once I was pleased with this, I placed the real DePuy Sector Gription acetabular  component size 54 and a single screw.  We then placed a 36 +4 polyethylene liner for that size acetabular component.  Attention was then turned to the femur with the leg externally rotated to 120 degrees extended and adducted, we were able to place a Mueller retractor medially and a Hohmann retractor behind the greater trochanter.  We last released the lateral joint capsule and used a box cutting osteotome to enter the femoral canal and a rongeur to lateralize.  We then began broaching with a size 8 broach, going up to a size 10.  With a size 10 in place, we trialed a varus offset femoral neck and a 36 +1.5 hip ball and reduced this in the acetabulum.  We were pleased with the range of motion, stability, offset, and leg lengths.  We then dislocated the hip and removed the trial components.  We were able to place the real Corail femoral component size 10 with standard offset and real 36 +1.5 ceramic hip ball, reduced this in the acetabulum.  Again, we were pleased with range of motion, leg length, offset, and stability.  We then irrigated the soft tissue with normal saline solution using pulsatile lavage.  We closed the joint capsule with interrupted #1 Ethibond suture followed by running #1 Vicryl in the tensor fascia, 0 Vicryl in the deep tissue, 2-0 Vicryl in the subcutaneous tissue, and staples on the skin.  Xeroform and Aquacel dressing were applied.  He was off the Hana table and taken to the recovery room in stable condition.  All final counts were correct.  There were no complications noted.  Of note, Erskine Emery, PA- C, assisted in the entire case.  His assistance was very useful for facilitating all aspects of this case.     Lind Guest. Ninfa Linden, M.D.     CYB/MEDQ  D:  04/23/2017  T:  04/23/2017  Job:  [id number]

## 2017-04-23 NOTE — Anesthesia Postprocedure Evaluation (Signed)
Anesthesia Post Note  Patient: Lance Rollins  Procedure(s) Performed: Procedure(s) (LRB): RIGHT TOTAL HIP ARTHROPLASTY ANTERIOR APPROACH (Right)     Patient location during evaluation: PACU Anesthesia Type: Spinal Level of consciousness: oriented and awake and alert Pain management: pain level controlled Vital Signs Assessment: post-procedure vital signs reviewed and stable Respiratory status: spontaneous breathing, respiratory function stable and patient connected to nasal cannula oxygen Cardiovascular status: blood pressure returned to baseline and stable Postop Assessment: no headache, no backache and no apparent nausea or vomiting Anesthetic complications: no    Last Vitals:  Vitals:   04/23/17 1215 04/23/17 1230  BP: 113/68 126/71  Pulse: 72 71  Resp: 16 15  Temp:    SpO2: 100% 100%    Last Pain:  Vitals:   04/23/17 1204  TempSrc:   PainSc: 0-No pain    LLE Motor Response: No movement due to regional block (04/23/17 1230) LLE Sensation: Numbness (04/23/17 1230) RLE Motor Response: No movement due to regional block (04/23/17 1230) RLE Sensation: Numbness (04/23/17 1230) L Sensory Level: S1-Sole of foot, small toes (04/23/17 1230) R Sensory Level: S1-Sole of foot, small toes (04/23/17 1230)  Javed Cotto

## 2017-04-24 LAB — CBC
HCT: 33.9 % — ABNORMAL LOW (ref 39.0–52.0)
HEMOGLOBIN: 11.4 g/dL — AB (ref 13.0–17.0)
MCH: 27.5 pg (ref 26.0–34.0)
MCHC: 33.6 g/dL (ref 30.0–36.0)
MCV: 81.9 fL (ref 78.0–100.0)
PLATELETS: 303 10*3/uL (ref 150–400)
RBC: 4.14 MIL/uL — AB (ref 4.22–5.81)
RDW: 14.8 % (ref 11.5–15.5)
WBC: 12.9 10*3/uL — AB (ref 4.0–10.5)

## 2017-04-24 LAB — BASIC METABOLIC PANEL
Anion gap: 7 (ref 5–15)
BUN: 13 mg/dL (ref 6–20)
CHLORIDE: 102 mmol/L (ref 101–111)
CO2: 25 mmol/L (ref 22–32)
Calcium: 8.8 mg/dL — ABNORMAL LOW (ref 8.9–10.3)
Creatinine, Ser: 0.72 mg/dL (ref 0.61–1.24)
GFR calc Af Amer: >60 mL/min (ref >60)
GLUCOSE: 106 mg/dL — AB (ref 65–99)
POTASSIUM: 4.2 mmol/L (ref 3.5–5.1)
SODIUM: 134 mmol/L — AB (ref 135–145)

## 2017-04-24 MED ORDER — OXYCODONE-ACETAMINOPHEN 5-325 MG PO TABS: 1.0000 | ORAL_TABLET | ORAL | 0 refills | Status: DC | PRN

## 2017-04-24 MED ORDER — TIZANIDINE HCL 4 MG PO TABS: 4.0000 mg | ORAL_TABLET | Freq: Three times a day (TID) | ORAL | 0 refills | Status: DC | PRN

## 2017-04-24 MED ORDER — ASPIRIN 325 MG PO TBEC: 325.0000 mg | DELAYED_RELEASE_TABLET | Freq: Two times a day (BID) | ORAL | 0 refills | Status: DC

## 2017-04-24 NOTE — Evaluation (Signed)
Physical Therapy Evaluation Patient Details Name: Lance Rollins MRN: [id number] DOB: 1945/12/04 Today's Date: 04/24/2017   History of Present Illness  Pt s/p R THR and with hx of L THR (4/15)  Clinical Impression  Pt s/p R THR and presents with decreased R LE strength/ROM and post op pain limiting functional mobility.  Pt should progress to dc home with family assist and HHPT follow up.    Follow Up Recommendations Home health PT    Equipment Recommendations  3in1 (PT)    Recommendations for Other Services       Precautions / Restrictions Precautions Precautions: Fall Restrictions Weight Bearing Restrictions: No Other Position/Activity Restrictions: WBAT      Mobility  Bed Mobility Overal bed mobility: Needs Assistance Bed Mobility: Supine to Sit     Supine to sit: Min assist     General bed mobility comments: cues for sequence and use of L LE to self assist  Transfers Overall transfer level: Needs assistance Equipment used: Rolling walker (2 wheeled) Transfers: Sit to/from Stand Sit to Stand: Min assist         General transfer comment: cues for LE management and use of UEs to self assist  Ambulation/Gait Ambulation/Gait assistance: Min assist Ambulation Distance (Feet): 100 Feet Assistive device: Rolling walker (2 wheeled) Gait Pattern/deviations: Step-to pattern;Step-through pattern;Decreased step length - right;Decreased step length - left;Shuffle;Trunk flexed Gait velocity: decr Gait velocity interpretation: Below normal speed for age/gender General Gait Details: cues for posture, position from RW and initial sequence  Stairs            Wheelchair Mobility    Modified Rankin (Stroke Patients Only)       Balance                                             Pertinent Vitals/Pain Pain Assessment: 0-10 Pain Score: 6  Pain Location: R hip Pain Descriptors / Indicators: Aching;Sore;Burning Pain Intervention(s): Monitored  during session;Limited activity within patient's tolerance;Premedicated before session;Ice applied    Home Living Family/patient expects to be discharged to:: Private residence Living Arrangements: Spouse/significant other Available Help at Discharge: Family Type of Home: House Home Access: Stairs to enter Entrance Stairs-Rails: None Entrance Stairs-Number of Steps: 2 Home Layout: Two level Home Equipment: Environmental consultant - 2 wheels;Cane - single point      Prior Function Level of Independence: Independent               Hand Dominance        Extremity/Trunk Assessment   Upper Extremity Assessment Upper Extremity Assessment: Overall WFL for tasks assessed    Lower Extremity Assessment Lower Extremity Assessment: RLE deficits/detail RLE Deficits / Details: Strength at hip 2/5 with AAROM at hip to 75 flex and 15 abd    Cervical / Trunk Assessment Cervical / Trunk Assessment: Normal  Communication   Communication: No difficulties  Cognition Arousal/Alertness: Awake/alert Behavior During Therapy: WFL for tasks assessed/performed Overall Cognitive Status: Within Functional Limits for tasks assessed                                        General Comments      Exercises Total Joint Exercises Ankle Circles/Pumps: AROM;Both;15 reps;Supine Quad Sets: AROM;Both;10 reps;Supine Heel Slides: AAROM;Right;20 reps;Supine Hip ABduction/ADduction: AAROM;Right;15 reps;Supine  Assessment/Plan    PT Assessment Patient needs continued PT services  PT Problem List Decreased strength;Decreased range of motion;Decreased activity tolerance;Decreased mobility;Pain;Obesity;Decreased knowledge of use of DME       PT Treatment Interventions DME instruction;Gait training;Stair training;Functional mobility training;Therapeutic activities;Therapeutic exercise;Patient/family education    PT Goals (Current goals can be found in the Care Plan section)  Acute Rehab PT  Goals Patient Stated Goal: Walk with less pain PT Goal Formulation: With patient Time For Goal Achievement: 04/28/17 Potential to Achieve Goals: Good    Frequency 7X/week   Barriers to discharge        Co-evaluation               AM-PAC PT "6 Clicks" Daily Activity  Outcome Measure Difficulty turning over in bed (including adjusting bedclothes, sheets and blankets)?: Unable Difficulty moving from lying on back to sitting on the side of the bed? : Unable Difficulty sitting down on and standing up from a chair with arms (e.g., wheelchair, bedside commode, etc,.)?: Unable Help needed moving to and from a bed to chair (including a wheelchair)?: A Little Help needed walking in hospital room?: A Little Help needed climbing 3-5 steps with a railing? : A Little 6 Click Score: 12    End of Session Equipment Utilized During Treatment: Gait belt Activity Tolerance: Patient tolerated treatment well Patient left: in chair;with call bell/phone within reach;with chair alarm set Nurse Communication: Mobility status PT Visit Diagnosis: Unsteadiness on feet (R26.81);Difficulty in walking, not elsewhere classified (R26.2)    Time: 6384-6659 PT Time Calculation (min) (ACUTE ONLY): 45 min   Charges:   PT Evaluation $PT Eval Low Complexity: 1 Low PT Treatments $Gait Training: 8-22 mins $Therapeutic Exercise: 8-22 mins   PT G Codes:        Pg [phone]   Kaylob Wallen 04/24/2017, 1:10 PM

## 2017-04-24 NOTE — Evaluation (Signed)
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 Pt currently with functional limitations due to the deficits listed below (see OT Problem List).  Pt will benefit from skilled OT to increase their safety and independence with ADL and functional mobility for ADL to facilitate discharge to venue listed below.      Follow Up Recommendations  No OT follow up    Equipment Recommendations  None recommended by OT    Recommendations for Other Services       Precautions / Restrictions Precautions Precautions: Fall Restrictions Weight Bearing Restrictions: No Other Position/Activity Restrictions: WBAT      Mobility Bed Mobility Overal bed mobility: Needs Assistance Bed Mobility: Supine to Sit     Supine to sit: Min assist     General bed mobility comments: pt in chair  Transfers Overall transfer level: Needs assistance Equipment used: Rolling walker (2 wheeled) Transfers: Sit to/from [facility] Sit to Stand: Min assist Stand pivot transfers: Min assist       General transfer comment: cues for LE management and use of UEs to self assist        ADL either performed or assessed with clinical judgement   ADL Overall ADL's : Needs assistance/impaired Eating/Feeding: Set up;Sitting   Grooming: Set up;Sitting   Upper Body Bathing: Set up;Sitting   Lower Body Bathing: Moderate assistance;Sit to/from stand;Cueing for safety;Cueing for sequencing   Upper Body Dressing : Set up;Sitting   Lower Body Dressing: Moderate assistance;Sit to/from stand;Cueing for safety;Cueing for sequencing   Toilet Transfer: Minimal assistance;RW;Ambulation;Comfort height toilet   Toileting- Clothing Manipulation and Hygiene: Minimal assistance;Sit to/from stand;Cueing for  sequencing         General ADL Comments: will benefit from AE     Vision Patient Visual Report: No change from baseline       Perception     Praxis      Pertinent Vitals/Pain Pain Assessment: 0-10 Pain Score: 3  Pain Location: R hip Pain Descriptors / Indicators: Aching;Sore;Burning Pain Intervention(s): Monitored during session     Hand Dominance     Extremity/Trunk Assessment Upper Extremity Assessment Upper Extremity Assessment: Overall WFL for tasks assessed   Lower Extremity Assessment Lower Extremity Assessment: RLE deficits/detail RLE Deficits / Details: Strength at hip 2/5 with AAROM at hip to 75 flex and 15 abd   Cervical / Trunk Assessment Cervical / Trunk Assessment: Normal   Communication Communication Communication: No difficulties   Cognition Arousal/Alertness: Awake/alert Behavior During Therapy: WFL for tasks assessed/performed Overall Cognitive Status: Within Functional Limits for tasks assessed                                           Shoulder Instructions      Home Living Family/patient expects to be discharged to:: Private residence Living Arrangements: Spouse/significant other Available Help at Discharge: Family Type of Home: House Home Access: Stairs to enter Technical brewer of Steps: 2 Entrance Stairs-Rails: None Home Layout: Two level Alternate Level Stairs-Number of Steps: 12 Alternate Level Stairs-Rails: Right           Home Equipment: Walker - 2 wheels;Cane - single point  Prior Functioning/Environment Level of Independence: Independent                 OT Problem List: Decreased strength;Decreased knowledge of use of DME or AE      OT Treatment/Interventions: Self-care/ADL training;Patient/family education;DME and/or AE instruction    OT Goals(Current goals can be found in the care plan section) Acute Rehab OT Goals Patient Stated Goal: Walk with less pain OT Goal  Formulation: With patient Time For Goal Achievement: 05/01/17  OT Frequency: Min 2X/week   Barriers to D/C:                 AM-PAC PT "6 Clicks" Daily Activity     Outcome Measure Help from another person eating meals?: None Help from another person taking care of personal grooming?: None Help from another person toileting, which includes using toliet, bedpan, or urinal?: A Little Help from another person bathing (including washing, rinsing, drying)?: A Little Help from another person to put on and taking off regular upper body clothing?: None Help from another person to put on and taking off regular lower body clothing?: A Little 6 Click Score: 21   End of Session Equipment Utilized During Treatment: Rolling walker Nurse Communication: Mobility status  Activity Tolerance: Patient tolerated treatment well Patient left: in chair;with call bell/phone within reach  OT Visit Diagnosis: Unsteadiness on feet (R26.81);Muscle weakness (generalized) (M62.81)                Time: 9675-9163 OT Time Calculation (min): 15 min Charges:  OT General Charges $OT Visit: 1 Visit OT Evaluation $OT Eval Low Complexity: 1 Low G-Codes:     Kari Baars, OT [phone]  Payton Mccallum D 04/24/2017, 1:23 PM

## 2017-04-24 NOTE — Progress Notes (Signed)
Subjective: 1 Day Post-Op Procedure(s) (LRB): RIGHT TOTAL HIP ARTHROPLASTY ANTERIOR APPROACH (Right) Patient reports pain as moderate.    Objective: Vital signs in last 24 hours: Temp:  [97.5 F (36.4 C)-98.4 F (36.9 C)] 98.2 F (36.8 C) (09/15 0500) Pulse Rate:  [71-98] 78 (09/15 0500) Resp:  [14-18] 16 (09/15 0500) BP: (113-167)/(64-94) 148/80 (09/15 0500) SpO2:  [95 %-100 %] 98 % (09/15 0500) FiO2 (%):  [28 %] 28 % (09/14 1452) Weight:  [243 lb (110.2 kg)] 243 lb (110.2 kg) (09/14 1445)  Intake/Output from previous day: 09/14 0701 - 09/15 0700 In: 5515 [P.O.:1660; I.V.:3750; IV Piggyback:105] Out: 0300 [Urine:4525; Blood:150] Intake/Output this shift: Total I/O In: 240 [P.O.:240] Out: 100 [Urine:100]   Recent Labs  04/24/17 0618  HGB 11.4*    Recent Labs  04/24/17 0618  WBC 12.9*  RBC 4.14*  HCT 33.9*  PLT 303    Recent Labs  04/24/17 0618  NA 134*  K 4.2  CL 102  CO2 25  BUN 13  CREATININE 0.72  GLUCOSE 106*  CALCIUM 8.8*   No results for input(s): LABPT, INR in the last 72 hours.  Sensation intact distally Intact pulses distally Dorsiflexion/Plantar flexion intact Incision: dressing C/D/I  Assessment/Plan: 1 Day Post-Op Procedure(s) (LRB): RIGHT TOTAL HIP ARTHROPLASTY ANTERIOR APPROACH (Right) Up with therapy Discharge home with home health NEXT 1-2 DAYS.  Mcarthur Rossetti 04/24/2017, 9:10 AM

## 2017-04-24 NOTE — Care Management Note (Signed)
Case Management Note  Patient Details  Name: Travarius Lange MRN: [id number] Date of Birth: 1946/06/07  Subjective/Objective:   Right THA                 Action/Plan: Discharge Planning: NCM spoke to pt and has RW at home. Offered choice for [facility]. Pt requesting [facility]. States he had them in the past. Requesting 3n1 bedside commode for home. Contacted [facility] DME rep for 3n1.   PCP ROSS, Dwyane Luo MD  Expected Discharge Date:               Expected Discharge Plan:  [place]  In-House Referral:  NA  Discharge planning Services  CM Consult  Post Acute Care Choice:  Home Health Choice offered to:  Patient  DME Arranged:  3-N-1 DME Agency:  [place]:  PT [place]:  [facility] ([facility])  Status of Service:  Completed, signed off  If discussed at [employer] of Stay Meetings, dates discussed:    Additional Comments:  Erenest Rasher, RN 04/24/2017, 12:57 PM

## 2017-04-24 NOTE — Progress Notes (Signed)
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   Follow Up Recommendations  Home health PT     Equipment Recommendations  3in1 (PT)    Recommendations for Other Services       Precautions / Restrictions Precautions Precautions: Fall Restrictions Weight Bearing Restrictions: No Other Position/Activity Restrictions: WBAT    Mobility  Bed Mobility Overal bed mobility: Needs Assistance Bed Mobility: Sit to Supine       Sit to supine: Min assist   General bed mobility comments: cues for sequence and use of L LE to self assist  Transfers Overall transfer level: Needs assistance Equipment used: Rolling walker (2 wheeled) Transfers: Sit to/from Stand Sit to Stand: Min guard         General transfer comment: cues for LE management and use of UEs to self assist  Ambulation/Gait Ambulation/Gait assistance: Min guard Ambulation Distance (Feet): 240 Feet Assistive device: Rolling walker (2 wheeled) Gait Pattern/deviations: Step-to pattern;Step-through pattern;Decreased step length - right;Decreased step length - left;Shuffle;Trunk flexed Gait velocity: decr Gait velocity interpretation: Below normal speed for age/gender General Gait Details: cues for posture, position from RW and initial sequence   Stairs            Wheelchair Mobility    Modified Rankin (Stroke Patients Only)       Balance                                            Cognition Arousal/Alertness: Awake/alert Behavior During Therapy: WFL for tasks assessed/performed Overall Cognitive Status: Within Functional Limits for tasks assessed                                        Exercises      General Comments        Pertinent Vitals/Pain Pain Assessment: 0-10 Pain Score: 3  Pain Location: R hip Pain Descriptors / Indicators: Aching;Sore;Burning Pain Intervention(s): Limited activity within patient's tolerance;Monitored during session;Premedicated before session;Ice applied    Home Living                      Prior Function            PT Goals (current goals can now be found in the care plan section) Acute Rehab PT Goals Patient Stated Goal: Walk with less pain PT Goal Formulation: With patient Time For Goal Achievement: 04/28/17 Potential to Achieve Goals: Good Progress towards PT goals: Progressing toward goals    Frequency    7X/week      PT Plan Current plan remains appropriate    Co-evaluation              AM-PAC PT "6 Clicks" Daily Activity  Outcome Measure  Difficulty turning over in bed (including adjusting bedclothes, sheets and blankets)?: Unable Difficulty moving from lying on back to sitting on the side of the bed? : Unable Difficulty sitting down on and standing up from a chair with arms (e.g., wheelchair, bedside commode, etc,.)?: A Lot Help needed moving  to and from a bed to chair (including a wheelchair)?: A Little Help needed walking in hospital room?: A Little Help needed climbing 3-5 steps with a railing? : A Little 6 Click Score: 13    End of Session Equipment Utilized During Treatment: Gait belt Activity Tolerance: Patient tolerated treatment well Patient left: in bed;with call bell/phone within reach Nurse Communication: Mobility status PT Visit Diagnosis: Unsteadiness on feet (R26.81);Difficulty in walking, not elsewhere classified (R26.2)     Time: 4259-5638 PT Time Calculation (min) (ACUTE ONLY): 33 min  Charges:  $Gait Training: 23-37 mins                    G Codes:       Pg [phone]    Montie Swiderski 04/24/2017, 5:50 PM

## 2017-04-24 NOTE — Discharge Instructions (Signed)

## 2017-04-25 NOTE — Progress Notes (Signed)
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)   OT comments  Pt plans on obtaining AE  Follow Up Recommendations  No OT follow up    Equipment Recommendations  None recommended by OT       Precautions / Restrictions Precautions Precautions: Fall Restrictions Weight Bearing Restrictions: No Other Position/Activity Restrictions: WBAT       Mobility Bed Mobility Overal bed mobility: Needs Assistance Bed Mobility: Sit to Supine;Supine to Sit     Supine to sit: Min assist Sit to supine: Supervision   General bed mobility comments: cues for sequence and use of L LE to self assist  Transfers Overall transfer level: Needs assistance Equipment used: Rolling walker (2 wheeled) Transfers: Sit to/from Stand Sit to Stand: Supervision         General transfer comment: VCs hand placement    Balance Overall balance assessment: Modified Independent                                         ADL either performed or assessed with clinical judgement   ADL Overall ADL's : Needs assistance/impaired                     Lower Body Dressing: Minimal assistance;Sit to/from stand;With adaptive equipment;Cueing for safety;Cueing for sequencing;Cueing for compensatory techniques Lower Body Dressing Details (indicate cue type and reason): AE instruction provided . Pt plans on obtaining as he feels this will A him  Toilet Transfer: Min guard;RW;Ambulation   Toileting- Clothing Manipulation and Hygiene: Min guard;Sit to/from stand;Cueing for safety;Cueing for sequencing         General ADL Comments: Ae instruction and practice      Vision Patient Visual Report: No change from baseline            Cognition Arousal/Alertness: Awake/alert Behavior During Therapy: WFL for tasks assessed/performed Overall  Cognitive Status: Within Functional Limits for tasks assessed                                                General Comments      Pertinent Vitals/ Pain       Pain Score: 7  Pain Location: R hip Pain Descriptors / Indicators: Aching;Sore;Burning Pain Intervention(s): Limited activity within patient's tolerance;Patient requesting pain meds-RN notified;Ice applied  Home Living                                          Prior Functioning/Environment              Frequency  Min 2X/week        Progress Toward Goals  OT Goals(current goals can now be found in the care plan section)  Progress towards OT goals: Progressing toward goals  Acute Rehab OT Goals Patient Stated Goal: Walk with less pain  Plan Discharge plan remains appropriate    Co-evaluation                 AM-PAC PT "6 Clicks" Daily Activity  Outcome Measure   Help from another person eating meals?: None Help from another person taking care of personal grooming?: None Help from another person toileting, which includes using toliet, bedpan, or urinal?: A Little Help from another person bathing (including washing, rinsing, drying)?: A Little Help from another person to put on and taking off regular upper body clothing?: None Help from another person to put on and taking off regular lower body clothing?: A Little 6 Click Score: 21    End of Session Equipment Utilized During Treatment: Rolling walker  OT Visit Diagnosis: Unsteadiness on feet (R26.81);Muscle weakness (generalized) (M62.81)   Activity Tolerance Patient tolerated treatment well   Patient Left in chair;with call bell/phone within reach   Nurse Communication Mobility status        Time: 3704-8889 OT Time Calculation (min): 15 min  Charges: OT General Charges $OT Visit: 1 Visit OT Treatments $Self Care/Home Management : 8-22 mins  [place], [place] Joanna   Betsy Pries 04/25/2017, 12:52 PM

## 2017-04-25 NOTE — Progress Notes (Signed)
RN reviewed discharge instructions with patient. All questions answered.   Paperwork and prescriptions given.   NT rolled patient down with all belongings to family car.  

## 2017-04-25 NOTE — Progress Notes (Signed)
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 Stair training completed. Pt demonstrates understanding of home exercise program. From PT standpoint, pt is ready to DC home.   Follow Up Recommendations  Home health PT     Equipment Recommendations  3in1 (PT)    Recommendations for Other Services       Precautions / Restrictions Precautions Precautions: Fall Restrictions Weight Bearing Restrictions: No Other Position/Activity Restrictions: WBAT    Mobility  Bed Mobility Overal bed mobility: Needs Assistance Bed Mobility: Sit to Supine;Supine to Sit     Supine to sit: Min assist Sit to supine: Supervision   General bed mobility comments: cues for sequence and use of L LE to self assist  Transfers Overall transfer level: Needs assistance Equipment used: Rolling walker (2 wheeled) Transfers: Sit to/from Stand Sit to Stand: Supervision         General transfer comment: VCs hand placement  Ambulation/Gait Ambulation/Gait assistance: Supervision Ambulation Distance (Feet): 180 Feet Assistive device: Rolling walker (2 wheeled) Gait Pattern/deviations: Step-through pattern;Decreased step length - right;Decreased step length - left;Trunk flexed Gait velocity: decr   General Gait Details: VCs posture   Stairs Stairs: Yes   Stair Management: No rails;One rail Right;Step to pattern;Backwards;Forwards;With cane;With walker   General stair comments: 3 steps backwards with RW and no rail with min A to manage RW; then 3 steps forwards with 1 rail R with cane and min/guard assist; VCs sequencing  Wheelchair Mobility    Modified Rankin (Stroke Patients Only)       Balance Overall balance assessment: Modified Independent                                          Cognition Arousal/Alertness: Awake/alert Behavior During Therapy: WFL for tasks assessed/performed Overall Cognitive Status: Within Functional Limits for tasks assessed                                        Exercises Total Joint Exercises Ankle Circles/Pumps: AROM;Both;15 reps;Supine Quad Sets: AROM;Both;10 reps;Supine Short Arc Quad: AROM;Right;10 reps;Supine Heel Slides: AAROM;Right;Supine;10 reps Hip ABduction/ADduction: AAROM;Right;Supine;10 reps Long Arc Quad: AROM;Right;10 reps;Seated    General Comments        Pertinent Vitals/Pain Pain Score: 8  Pain Location: R hip Pain Descriptors / Indicators: Aching;Sore;Burning Pain Intervention(s): Limited activity within patient's tolerance;Monitored during session;Premedicated before session;Ice applied    Home Living                      Prior Function            PT Goals (current goals can now be found in the care plan section) Acute Rehab PT Goals Patient Stated Goal: Walk with less pain PT Goal Formulation: With patient Time For Goal Achievement: 04/28/17 Potential to Achieve Goals: Good Progress towards PT goals: Progressing toward goals    Frequency    7X/week      PT Plan Current plan remains appropriate    Co-evaluation  AM-PAC PT "6 Clicks" Daily Activity  Outcome Measure  Difficulty turning over in bed (including adjusting bedclothes, sheets and blankets)?: A Little Difficulty moving from lying on back to sitting on the side of the bed? : Unable Difficulty sitting down on and standing up from a chair with arms (e.g., wheelchair, bedside commode, etc,.)?: A Little Help needed moving to and from a bed to chair (including a wheelchair)?: A Little Help needed walking in hospital room?: A Little Help needed climbing 3-5 steps with a railing? : A Little 6 Click Score: 16    End of Session Equipment Utilized During  Treatment: Gait belt Activity Tolerance: Patient tolerated treatment well Patient left: in bed;with call bell/phone within reach Nurse Communication: Mobility status PT Visit Diagnosis: Unsteadiness on feet (R26.81);Difficulty in walking, not elsewhere classified (R26.2)     Time: 6269-4854 PT Time Calculation (min) (ACUTE ONLY): 48 min  Charges:  $Gait Training: 8-22 mins $Therapeutic Exercise: 8-22 mins $Therapeutic Activity: 8-22 mins                    G Codes:          Philomena Doheny 04/25/2017, 10:43 AM [phone]

## 2017-04-25 NOTE — Progress Notes (Signed)
   Subjective: 2 Days Post-Op Procedure(s) (LRB): RIGHT TOTAL HIP ARTHROPLASTY ANTERIOR APPROACH (Right) Patient reports pain as mild.    Objective: Vital signs in last 24 hours: Temp:  [98 F (36.7 C)-99.1 F (37.3 C)] 99.1 F (37.3 C) (09/16 0615) Pulse Rate:  [73-94] 87 (09/16 0615) Resp:  [15-20] 20 (09/16 0615) BP: (125-180)/(73-79) 135/79 (09/16 0615) SpO2:  [95 %-97 %] 95 % (09/16 0615)  Intake/Output from previous day: 09/15 0701 - 09/16 0700 In: 900 [P.O.:600; I.V.:300] Out: 700 [Urine:700] Intake/Output this shift: Total I/O In: 240 [P.O.:240] Out: -    Recent Labs  04/24/17 0618  HGB 11.4*    Recent Labs  04/24/17 0618  WBC 12.9*  RBC 4.14*  HCT 33.9*  PLT 303    Recent Labs  04/24/17 0618  NA 134*  K 4.2  CL 102  CO2 25  BUN 13  CREATININE 0.72  GLUCOSE 106*  CALCIUM 8.8*   No results for input(s): LABPT, INR in the last 72 hours.  Neurologically intact No results found.  Assessment/Plan: 2 Days Post-Op Procedure(s) (LRB): RIGHT TOTAL HIP ARTHROPLASTY ANTERIOR APPROACH (Right) Up with therapy. Pt wants to stay today and go home Monday.   Lance Rollins 04/25/2017, 11:50 AM

## 2017-04-27 ENCOUNTER — Telehealth (INDEPENDENT_AMBULATORY_CARE_PROVIDER_SITE_OTHER): Payer: Self-pay | Admitting: Orthopaedic Surgery

## 2017-04-27 NOTE — Telephone Encounter (Signed)
Lance Rollins from [facility] called wanting VO for [facility] PT for the following:  3x for 2 weeks.  CB#[phone].  Thank you.

## 2017-04-27 NOTE — Telephone Encounter (Signed)
Verbal order given to Kate 

## 2017-05-05 NOTE — Discharge Summary (Signed)
Patient ID: Lance Rollins MRN: [id number] DOB/AGE: Mar 19, 1946 71 y.o.  Admit date: 04/23/2017 Discharge date: 05/05/2017  Admission Diagnoses:  Principal Problem:   Unilateral primary osteoarthritis, right hip Active Problems:   Status post total replacement of right hip   Discharge Diagnoses:  Same  Past Medical History:  Diagnosis Date  . Arthritis   . Bleeding per rectum 08/2013   "black and bright red" (08/14/2013)  . Hypercholesteremia   . Obesity   . Prostate cancer ([place]) 2011  . Unilateral primary osteoarthritis, right hip 04/23/2017    Surgeries: Procedure(s): RIGHT TOTAL HIP ARTHROPLASTY ANTERIOR APPROACH on 04/23/2017   Consultants:   Discharged Condition: Improved  Hospital Course: Lance Rollins is an 71 y.o. male who was admitted 04/23/2017 for operative treatment ofUnilateral primary osteoarthritis, right hip. Patient has severe unremitting pain that affects sleep, daily activities, and work/hobbies. After pre-op clearance the patient was taken to the operating room on 04/23/2017 and underwent  Procedure(s): RIGHT TOTAL HIP ARTHROPLASTY ANTERIOR APPROACH.    Patient was given perioperative antibiotics:  Anti-infectives    Start     Dose/Rate Route Frequency Ordered Stop   04/23/17 1630  ceFAZolin (ANCEF) IVPB 1 g/50 mL premix     1 g 100 mL/hr over 30 Minutes Intravenous Every 6 hours 04/23/17 1451 04/23/17 2204   04/23/17 0841  ceFAZolin (ANCEF) 2-4 GM/100ML-% IVPB    Comments:  Bridget Hartshorn   : cabinet override      04/23/17 0841 04/23/17 1029   04/23/17 0833  ceFAZolin (ANCEF) IVPB 2g/100 mL premix     2 g 200 mL/hr over 30 Minutes Intravenous On call to O.R. 04/23/17 4193 04/23/17 1029       Patient was given sequential compression devices, early ambulation, and chemoprophylaxis to prevent DVT.  Patient benefited maximally from hospital stay and there were no complications.    Recent vital signs: No data found.    Recent laboratory studies: No  results for input(s): WBC, HGB, HCT, PLT, NA, K, CL, CO2, BUN, CREATININE, GLUCOSE, INR, CALCIUM in the last 72 hours.  Invalid input(s): PT, 2   Discharge Medications:   Allergies as of 04/25/2017   No Known Allergies     Medication List    STOP taking these medications   meloxicam 7.5 MG tablet Commonly known as:  MOBIC     TAKE these medications   aspirin 325 MG EC tablet Take 1 tablet (325 mg total) by mouth 2 (two) times daily after a meal. What changed:  when to take this   multivitamin with minerals Tabs tablet Take 1 tablet by mouth daily.   oxyCODONE-acetaminophen 5-325 MG tablet Commonly known as:  ROXICET Take 1-2 tablets by mouth every 4 (four) hours as needed.   tiZANidine 4 MG tablet Commonly known as:  ZANAFLEX Take 1 tablet (4 mg total) by mouth every 8 (eight) hours as needed for muscle spasms. Notes to patient:  Muscle relaxer   traMADol 50 MG tablet Commonly known as:  ULTRAM TAKE 1 TO 2 TABLETS BY MOUTH EVERY 8 TO 12 HOURS AS NEEDED FOR PAIN            Discharge Care Instructions        Start     Ordered   04/24/17 0000  aspirin 325 MG EC tablet  2 times daily after meals     04/24/17 0912   04/24/17 0000  oxyCODONE-acetaminophen (ROXICET) 5-325 MG tablet  Every 4 hours PRN  04/24/17 0912   04/24/17 0000  tiZANidine (ZANAFLEX) 4 MG tablet  Every 8 hours PRN     04/24/17 0912      Diagnostic Studies: Dg Pelvis Portable  Result Date: 04/23/2017 CLINICAL DATA:  71 year old male status post right hip replacement. Preexisting left hip arthroplasty. EXAM: PORTABLE PELVIS 1-2 VIEWS COMPARISON:  Intraoperative images from 1032 hours today, and earlier. FINDINGS: Portable AP view of the pelvis at 1216 hours. Right total hip arthroplasty hardware in place and appears normally aligned on this AP view. Left hip arthroplasty appears stable since 2015. No unexpected osseous changes. Postoperative soft tissue changes about the right hip. IMPRESSION:  New right total hip arthroplasty with no adverse features. Electronically Signed   By: Genevie Ann M.D.   On: 04/23/2017 12:28   Dg C-arm 61-120 Min-no Report  Result Date: 04/23/2017 Fluoroscopy was utilized by the requesting physician.  No radiographic interpretation.   Dg Hip Operative Unilat With Pelvis Right  Result Date: 04/23/2017 CLINICAL DATA:  Right hip replacement EXAM: DG C-ARM 61-120 MIN-NO REPORT; OPERATIVE RIGHT HIP WITH PELVIS COMPARISON:  None. FINDINGS: Changes of right hip replacement. Normal AP alignment. No visible hardware or bony complicating feature. IMPRESSION: Right hip replacement.  No visible complicating feature. Electronically Signed   By: Rolm Baptise M.D.   On: 04/23/2017 11:46    Disposition: 01-Home or Self Care    Follow-up Information    Mcarthur Rossetti, MD Follow up in 2 week(s).   Specialty:  Orthopedic Surgery Contact information: [address] [place] [address] [phone]        [facility] Follow up.   Specialty:  [place] Why:  [place] will call you to arrange initial visit Contact information: [address] [place] [address] [phone]            Signed: Erskine Emery 05/05/2017, 12:21 PM

## 2017-05-06 ENCOUNTER — Telehealth (INDEPENDENT_AMBULATORY_CARE_PROVIDER_SITE_OTHER): Payer: Self-pay

## 2017-05-06 ENCOUNTER — Other Ambulatory Visit (INDEPENDENT_AMBULATORY_CARE_PROVIDER_SITE_OTHER): Payer: Self-pay | Admitting: Orthopaedic Surgery

## 2017-05-06 ENCOUNTER — Ambulatory Visit (INDEPENDENT_AMBULATORY_CARE_PROVIDER_SITE_OTHER): Payer: Medicare Other | Admitting: Orthopaedic Surgery

## 2017-05-06 DIAGNOSIS — Z96641 Presence of right artificial hip joint: Secondary | ICD-10-CM

## 2017-05-06 MED ORDER — OXYCODONE-ACETAMINOPHEN 5-325 MG PO TABS: 1.0000 | ORAL_TABLET | Freq: Four times a day (QID) | ORAL | 0 refills | Status: DC | PRN

## 2017-05-06 NOTE — Telephone Encounter (Signed)
Patient called wanting to know if he can be given another Rx.  Patient daughter had left Rx on top of car and drove off.  Patient was seen today by Dr. Ninfa Linden.  Cb# is [phone]. Please advise.  Thank You.

## 2017-05-06 NOTE — Progress Notes (Signed)
Patient is to weeks tomorrow status post a right total hip arthroplasty through direct anterior approach. He is doing well overall and has no significant complaints.  On exam I removed all staples. He has slight opening and dehiscence of the very top wears groin crease and abdomen comes over the incision. His leg lengths are equal. He has no significant seroma. His nose infection.  We'll have him stop the aspirin twice a day. I'll refill his oxycodone. I will have him place mupirocin ointment on the wound daily after each shower. We'll see him back in 3 major is doing well.

## 2017-05-06 NOTE — Telephone Encounter (Signed)
Please advise 

## 2017-05-06 NOTE — Telephone Encounter (Signed)
He can come pick up her prescription.

## 2017-05-07 NOTE — Telephone Encounter (Signed)
Rx ready at front desk

## 2017-05-28 ENCOUNTER — Telehealth (INDEPENDENT_AMBULATORY_CARE_PROVIDER_SITE_OTHER): Payer: Self-pay | Admitting: Orthopaedic Surgery

## 2017-05-28 NOTE — Telephone Encounter (Signed)
Patient called needing Rx refill (Oxycodone and Tivanidine) muscle relaxer The number to contact patient is [phone]

## 2017-05-28 NOTE — Telephone Encounter (Signed)
Please advise Patient aware it will be Monday before he gets the pain medication

## 2017-05-31 MED ORDER — TIZANIDINE HCL 4 MG PO TABS: 4.0000 mg | ORAL_TABLET | Freq: Three times a day (TID) | ORAL | 0 refills | Status: DC | PRN

## 2017-05-31 MED ORDER — OXYCODONE-ACETAMINOPHEN 5-325 MG PO TABS: 1.0000 | ORAL_TABLET | Freq: Four times a day (QID) | ORAL | 0 refills | Status: DC | PRN

## 2017-05-31 NOTE — Telephone Encounter (Signed)
Patient aware Rx's ready at front desk

## 2017-05-31 NOTE — Telephone Encounter (Signed)
He can come and pick up prescriptions today.

## 2017-06-02 ENCOUNTER — Encounter (INDEPENDENT_AMBULATORY_CARE_PROVIDER_SITE_OTHER): Payer: Self-pay | Admitting: Orthopaedic Surgery

## 2017-06-02 ENCOUNTER — Ambulatory Visit (INDEPENDENT_AMBULATORY_CARE_PROVIDER_SITE_OTHER): Payer: Medicare Other | Admitting: Orthopaedic Surgery

## 2017-06-02 DIAGNOSIS — Z96641 Presence of right artificial hip joint: Secondary | ICD-10-CM | POA: Insufficient documentation

## 2017-06-02 NOTE — Progress Notes (Signed)
The patient is now between 6 and 7 weeks status post a right total hip arthroplasty.  He says doing much better overall and is only occasionally using his cane.  On examination his incision is well-healed.  His leg lengths are equal.  He is 4 years out from a left total hip arthroplasty.  He talks including both hips through range of motion.  At this point we will continue to increase his activities.  We are refilled his oxycodone early this week and I counseled about that knowing that we want to transition hydrocodone next.  From a clinical standpoint an exam standpoint, we do not need to see him back for 6 months.  I would like a low AP pelvis at that visit.

## 2017-06-23 ENCOUNTER — Other Ambulatory Visit (INDEPENDENT_AMBULATORY_CARE_PROVIDER_SITE_OTHER): Payer: Self-pay | Admitting: Orthopaedic Surgery

## 2017-06-23 ENCOUNTER — Telehealth (INDEPENDENT_AMBULATORY_CARE_PROVIDER_SITE_OTHER): Payer: Self-pay

## 2017-06-23 MED ORDER — HYDROCODONE-ACETAMINOPHEN 5-325 MG PO TABS: 1.0000 | ORAL_TABLET | Freq: Three times a day (TID) | ORAL | 0 refills | Status: DC | PRN

## 2017-06-23 NOTE — Telephone Encounter (Signed)
HE CAN COME AND PICK UP A SCRIPT FOR HYDROCODONE

## 2017-06-23 NOTE — Telephone Encounter (Signed)
Patient called requesting refill of pain medicine

## 2017-06-24 NOTE — Telephone Encounter (Signed)
LMOM for patient that Rx ready at front desk

## 2017-08-04 ENCOUNTER — Telehealth (INDEPENDENT_AMBULATORY_CARE_PROVIDER_SITE_OTHER): Payer: Self-pay | Admitting: Orthopaedic Surgery

## 2017-08-04 NOTE — Telephone Encounter (Signed)
Only norco at this point.

## 2017-08-04 NOTE — Telephone Encounter (Signed)
Please advise. Patient last received Hydrocodone / 1-2 po tid prn pain #30 on 06/23/17.

## 2017-08-04 NOTE — Telephone Encounter (Signed)
Patient called asking for a refill on oxycodone. CB # [phone]

## 2017-08-04 NOTE — Telephone Encounter (Signed)
Ok to refill Norco with same instructions and have patient pick up after you arrive for clinic to sign tomorrow afternoon?

## 2017-08-05 ENCOUNTER — Other Ambulatory Visit (INDEPENDENT_AMBULATORY_CARE_PROVIDER_SITE_OTHER): Payer: Self-pay | Admitting: Orthopaedic Surgery

## 2017-08-05 MED ORDER — HYDROCODONE-ACETAMINOPHEN 5-325 MG PO TABS: 1.0000 | ORAL_TABLET | Freq: Three times a day (TID) | ORAL | 0 refills | Status: DC | PRN

## 2017-08-05 NOTE — Telephone Encounter (Signed)
Patient advised.

## 2017-08-05 NOTE — Telephone Encounter (Signed)
He can come and pick up one more script, but this will have to be thew last one since he is now just over 3 months since surgery

## 2017-08-26 ENCOUNTER — Other Ambulatory Visit (INDEPENDENT_AMBULATORY_CARE_PROVIDER_SITE_OTHER): Payer: Self-pay | Admitting: Orthopaedic Surgery

## 2017-08-26 NOTE — Telephone Encounter (Signed)
Please advise 

## 2017-09-01 ENCOUNTER — Telehealth (INDEPENDENT_AMBULATORY_CARE_PROVIDER_SITE_OTHER): Payer: Self-pay | Admitting: Orthopaedic Surgery

## 2017-09-01 NOTE — Telephone Encounter (Signed)
At this point he is over 4 months out from his surgery.  We cannot provide oxycodone at this standpoint.  The only thing we can provide his tramadol or Tylenol 3 for at least 4 more weeks and then no narcotics after that.  Which ever he wants of the tramadol or Tylenol 3 he can have 1-2 2-3 times daily as needed #60 with no refills.

## 2017-09-01 NOTE — Telephone Encounter (Signed)
Please advise 

## 2017-09-01 NOTE — Telephone Encounter (Signed)
Patient called asking for a refill on his oxycodone. CB # [phone]

## 2017-09-02 ENCOUNTER — Other Ambulatory Visit (INDEPENDENT_AMBULATORY_CARE_PROVIDER_SITE_OTHER): Payer: Self-pay

## 2017-09-02 MED ORDER — TRAMADOL HCL 50 MG PO TABS: 50.0000 mg | ORAL_TABLET | Freq: Three times a day (TID) | ORAL | 0 refills | Status: DC | PRN

## 2017-09-02 NOTE — Telephone Encounter (Signed)
Called Tramadol into his [facility] per patient request

## 2017-09-24 ENCOUNTER — Other Ambulatory Visit (INDEPENDENT_AMBULATORY_CARE_PROVIDER_SITE_OTHER): Payer: Self-pay | Admitting: Orthopaedic Surgery

## 2017-11-10 ENCOUNTER — Telehealth (INDEPENDENT_AMBULATORY_CARE_PROVIDER_SITE_OTHER): Payer: Self-pay | Admitting: Orthopaedic Surgery

## 2017-11-10 ENCOUNTER — Other Ambulatory Visit (INDEPENDENT_AMBULATORY_CARE_PROVIDER_SITE_OTHER): Payer: Self-pay

## 2017-11-10 MED ORDER — TRAMADOL HCL 50 MG PO TABS: 50.0000 mg | ORAL_TABLET | Freq: Four times a day (QID) | ORAL | 0 refills | Status: DC | PRN

## 2017-11-10 NOTE — Telephone Encounter (Signed)
Ok to send in some tramdol.  1-2 every 8-12 hours as needed for pain as needed #60 with no refills

## 2017-11-10 NOTE — Telephone Encounter (Signed)
Called into pharmacy

## 2017-11-10 NOTE — Telephone Encounter (Signed)
Please advise 

## 2017-11-10 NOTE — Telephone Encounter (Signed)
Patient requesting RX refill on tramadol - [facility] [address].

## 2017-11-24 ENCOUNTER — Telehealth (INDEPENDENT_AMBULATORY_CARE_PROVIDER_SITE_OTHER): Payer: Self-pay | Admitting: Orthopaedic Surgery

## 2017-11-24 MED ORDER — TIZANIDINE HCL 4 MG PO TABS: 4.0000 mg | ORAL_TABLET | Freq: Three times a day (TID) | ORAL | 0 refills | Status: DC | PRN

## 2017-11-24 NOTE — Telephone Encounter (Signed)
Ok. I thought I escribed it in, but it printed out instead.

## 2017-11-24 NOTE — Telephone Encounter (Signed)
Patient called requesting an RX refill on his Tizanidine.  [facility] on [address].  CB#[phone].  Thank you.

## 2017-11-24 NOTE — Telephone Encounter (Signed)
Called into pharmacy

## 2017-11-24 NOTE — Telephone Encounter (Signed)
Please advise 

## 2017-12-01 ENCOUNTER — Ambulatory Visit (INDEPENDENT_AMBULATORY_CARE_PROVIDER_SITE_OTHER): Payer: Medicare Other | Admitting: Orthopaedic Surgery

## 2017-12-01 ENCOUNTER — Encounter (INDEPENDENT_AMBULATORY_CARE_PROVIDER_SITE_OTHER): Payer: Self-pay | Admitting: Orthopaedic Surgery

## 2017-12-01 ENCOUNTER — Ambulatory Visit (INDEPENDENT_AMBULATORY_CARE_PROVIDER_SITE_OTHER): Payer: Medicare Other

## 2017-12-01 DIAGNOSIS — Z96642 Presence of left artificial hip joint: Secondary | ICD-10-CM

## 2017-12-01 DIAGNOSIS — Z96641 Presence of right artificial hip joint: Secondary | ICD-10-CM

## 2017-12-01 NOTE — Progress Notes (Signed)
The patient has a history of bilateral hip replacements.  The right hip was replaced 9 months ago in the left hip a few years ago.  He reports that he is doing well.  On examination of both hips there is no fluidly.  There is no significant pain with either hip.  His ligaments are equal.  X-rays confirm well-seated implants with no complicating features.  At this point all question concerns were answered and addressed.  From his hip standpoint to follow-up as needed.  Of note if there is any issues in the meantime with anything as it pertains his hips we would need to see him back for x-rays.  He has been can see him for other orthopedic issues as well.

## 2017-12-16 ENCOUNTER — Other Ambulatory Visit: Payer: Self-pay | Admitting: Oral Surgery

## 2017-12-16 DIAGNOSIS — R6884 Jaw pain: Secondary | ICD-10-CM

## 2017-12-21 ENCOUNTER — Ambulatory Visit
Admission: RE | Admit: 2017-12-21 | Discharge: 2017-12-21 | Disposition: A | Discharge status: Home / Self Care | Payer: Medicare Other | Source: Ambulatory Visit | Attending: Oral Surgery | Admitting: Oral Surgery

## 2017-12-21 DIAGNOSIS — R6884 Jaw pain: Secondary | ICD-10-CM

## 2017-12-30 ENCOUNTER — Other Ambulatory Visit (INDEPENDENT_AMBULATORY_CARE_PROVIDER_SITE_OTHER): Payer: Self-pay

## 2017-12-30 ENCOUNTER — Telehealth (INDEPENDENT_AMBULATORY_CARE_PROVIDER_SITE_OTHER): Payer: Self-pay | Admitting: Orthopaedic Surgery

## 2017-12-30 MED ORDER — TRAMADOL HCL 50 MG PO TABS: 50.0000 mg | ORAL_TABLET | Freq: Four times a day (QID) | ORAL | 0 refills | Status: DC | PRN

## 2017-12-30 MED ORDER — MELOXICAM 7.5 MG PO TABS: 7.5000 mg | ORAL_TABLET | Freq: Two times a day (BID) | ORAL | 1 refills | Status: DC

## 2017-12-30 MED ORDER — TIZANIDINE HCL 4 MG PO TABS: 4.0000 mg | ORAL_TABLET | Freq: Three times a day (TID) | ORAL | 0 refills | Status: DC | PRN

## 2017-12-30 NOTE — Telephone Encounter (Signed)
Please advise 

## 2017-12-30 NOTE — Telephone Encounter (Signed)
Ok to refill all of these. Thanks

## 2017-12-30 NOTE — Telephone Encounter (Signed)
Patient called for refill for  meloxicam (MOBIC) 7.5 MG  traMADol (ULTRAM) tiZANidine (ZANAFLEX) 4 MG tablet    Please call patient to advise.

## 2017-12-30 NOTE — Telephone Encounter (Signed)
Called into pharmacy. Patient aware. 

## 2018-01-06 ENCOUNTER — Other Ambulatory Visit (INDEPENDENT_AMBULATORY_CARE_PROVIDER_SITE_OTHER): Payer: Self-pay

## 2018-01-06 ENCOUNTER — Telehealth (INDEPENDENT_AMBULATORY_CARE_PROVIDER_SITE_OTHER): Payer: Self-pay | Admitting: Orthopaedic Surgery

## 2018-01-06 MED ORDER — TIZANIDINE HCL 4 MG PO TABS: 4.0000 mg | ORAL_TABLET | Freq: Three times a day (TID) | ORAL | 0 refills | Status: DC | PRN

## 2018-01-06 NOTE — Telephone Encounter (Signed)
Faxed

## 2018-01-06 NOTE — Telephone Encounter (Signed)
Patient called saying that he received his other 2 medications but he didn't get his muscle relaxer. He would like for that to be sent into his pharmacy please. CB # [phone]

## 2018-02-07 ENCOUNTER — Telehealth (INDEPENDENT_AMBULATORY_CARE_PROVIDER_SITE_OTHER): Payer: Self-pay | Admitting: Orthopaedic Surgery

## 2018-02-07 MED ORDER — TRAMADOL HCL 50 MG PO TABS: 50.0000 mg | ORAL_TABLET | Freq: Two times a day (BID) | ORAL | 0 refills | Status: DC | PRN

## 2018-02-07 NOTE — Telephone Encounter (Signed)
Patient called requesting an RX refill on his Tramadol.  [facility] uses [facility] on [address]  [phone].  Thank you.

## 2018-02-07 NOTE — Telephone Encounter (Signed)
Please advise 

## 2018-02-07 NOTE — Telephone Encounter (Signed)
I sent some in to his pharmacy. 

## 2018-02-15 ENCOUNTER — Telehealth (INDEPENDENT_AMBULATORY_CARE_PROVIDER_SITE_OTHER): Payer: Self-pay | Admitting: Orthopaedic Surgery

## 2018-02-15 NOTE — Telephone Encounter (Signed)
Please advise 

## 2018-02-15 NOTE — Telephone Encounter (Signed)
Pt called, is requesting a rx for muscle spasms /[facility] on [address]..pts callback @ [phone]

## 2018-02-17 MED ORDER — TIZANIDINE HCL 4 MG PO TABS: 4.0000 mg | ORAL_TABLET | Freq: Three times a day (TID) | ORAL | 0 refills | Status: DC | PRN

## 2018-02-17 NOTE — Telephone Encounter (Signed)
I sent in a muscle relaxer

## 2018-02-17 NOTE — Telephone Encounter (Signed)
Please advise 

## 2018-02-17 NOTE — Telephone Encounter (Signed)
Patient called to check on rx refill

## 2018-03-01 ENCOUNTER — Telehealth (INDEPENDENT_AMBULATORY_CARE_PROVIDER_SITE_OTHER): Payer: Self-pay | Admitting: Orthopaedic Surgery

## 2018-03-01 NOTE — Telephone Encounter (Signed)
That will be fine. 

## 2018-03-01 NOTE — Telephone Encounter (Signed)
Patient called requesting a handicap placard. The number to contact patient is [phone]

## 2018-03-01 NOTE — Telephone Encounter (Signed)
Is this ok?

## 2018-03-01 NOTE — Telephone Encounter (Signed)
Patient aware this will be ready in the morning

## 2018-03-03 ENCOUNTER — Telehealth (INDEPENDENT_AMBULATORY_CARE_PROVIDER_SITE_OTHER): Payer: Self-pay | Admitting: Orthopaedic Surgery

## 2018-03-03 NOTE — Telephone Encounter (Signed)
That will be fine. 

## 2018-03-03 NOTE — Telephone Encounter (Signed)
LMOM for patient letting him know I have new handicap at front desk

## 2018-03-03 NOTE — Telephone Encounter (Signed)
See below, ok?

## 2018-03-03 NOTE — Telephone Encounter (Signed)
Patient's daughter called stating that they took the Handicap placard application to the [facility].  They advised her that since he has been getting the placard for 5 years could you all change his application to be valid for 5 years.

## 2018-03-16 ENCOUNTER — Telehealth (INDEPENDENT_AMBULATORY_CARE_PROVIDER_SITE_OTHER): Payer: Self-pay | Admitting: Orthopaedic Surgery

## 2018-03-16 NOTE — Telephone Encounter (Signed)
Ok to refill 

## 2018-03-16 NOTE — Telephone Encounter (Signed)
Patient left voicemail requesting rx refill on tramadol. Patients # [phone]

## 2018-03-16 NOTE — Telephone Encounter (Signed)
Please advise 

## 2018-03-17 ENCOUNTER — Telehealth (INDEPENDENT_AMBULATORY_CARE_PROVIDER_SITE_OTHER): Payer: Self-pay | Admitting: Orthopaedic Surgery

## 2018-03-17 ENCOUNTER — Other Ambulatory Visit (INDEPENDENT_AMBULATORY_CARE_PROVIDER_SITE_OTHER): Payer: Self-pay

## 2018-03-17 ENCOUNTER — Other Ambulatory Visit (INDEPENDENT_AMBULATORY_CARE_PROVIDER_SITE_OTHER): Payer: Self-pay | Admitting: Orthopaedic Surgery

## 2018-03-17 MED ORDER — TRAMADOL HCL 50 MG PO TABS: 50.0000 mg | ORAL_TABLET | Freq: Two times a day (BID) | ORAL | 0 refills | Status: DC | PRN

## 2018-03-17 NOTE — Telephone Encounter (Signed)
Called into pharmacy. Patient aware. 

## 2018-03-17 NOTE — Telephone Encounter (Signed)
Patient called needing Rx refilled (Tramadol)  The number to contact patient is [phone]

## 2018-03-30 ENCOUNTER — Telehealth (INDEPENDENT_AMBULATORY_CARE_PROVIDER_SITE_OTHER): Payer: Self-pay | Admitting: Orthopaedic Surgery

## 2018-03-30 MED ORDER — TIZANIDINE HCL 4 MG PO TABS: 4.0000 mg | ORAL_TABLET | Freq: Three times a day (TID) | ORAL | 0 refills | Status: DC | PRN

## 2018-03-30 NOTE — Telephone Encounter (Signed)
I did send in some more of the muscle relaxer.  Tell him to use this sparingly because I need to stop providing them at this standpoint.

## 2018-03-30 NOTE — Telephone Encounter (Signed)
Patient called needing Rx refilled (Tizanidine) The number to contact patient is [phone]

## 2018-03-30 NOTE — Telephone Encounter (Signed)
Please advise 

## 2018-04-01 NOTE — Telephone Encounter (Signed)
Patient aware of the below message  

## 2018-09-05 ENCOUNTER — Ambulatory Visit (INDEPENDENT_AMBULATORY_CARE_PROVIDER_SITE_OTHER): Payer: Medicare Other

## 2018-09-05 ENCOUNTER — Encounter (INDEPENDENT_AMBULATORY_CARE_PROVIDER_SITE_OTHER): Payer: Self-pay | Admitting: Orthopaedic Surgery

## 2018-09-05 ENCOUNTER — Ambulatory Visit (INDEPENDENT_AMBULATORY_CARE_PROVIDER_SITE_OTHER): Payer: Medicare Other | Admitting: Orthopaedic Surgery

## 2018-09-05 DIAGNOSIS — M25552 Pain in left hip: Secondary | ICD-10-CM

## 2018-09-05 DIAGNOSIS — M7062 Trochanteric bursitis, left hip: Secondary | ICD-10-CM

## 2018-09-05 DIAGNOSIS — Z96642 Presence of left artificial hip joint: Secondary | ICD-10-CM

## 2018-09-05 MED ORDER — DICLOFENAC SODIUM 75 MG PO TBEC: 75.0000 mg | DELAYED_RELEASE_TABLET | Freq: Two times a day (BID) | ORAL | 3 refills | Status: DC | PRN

## 2018-09-05 MED ORDER — LIDOCAINE HCL 1 % IJ SOLN
3.0000 mL | INTRAMUSCULAR | Status: AC | PRN
  Administered 2018-09-05: 3 mL

## 2018-09-05 MED ORDER — METHYLPREDNISOLONE 4 MG PO TABS: ORAL_TABLET | ORAL | 0 refills | Status: DC

## 2018-09-05 MED ORDER — METHYLPREDNISOLONE ACETATE 40 MG/ML IJ SUSP
40.0000 mg | INTRAMUSCULAR | Status: AC | PRN
  Administered 2018-09-05: 40 mg via INTRA_ARTICULAR

## 2018-09-05 NOTE — Progress Notes (Signed)
Office Visit Note   Patient: Lance Rollins           Date of Birth: 1946-03-23           MRN: [id number] Visit Date: 09/05/2018              Requested by: Lawerance Cruel, [place], [place] [address] PCP: Lawerance Cruel, MD   Assessment & Plan: Visit Diagnoses:  1. Pain in left hip   2. Trochanteric bursitis, left hip   3. Status post total replacement of left hip     Plan: I do feel he has a component of trochanteric bursitis.  X-rays of his implants show no complicating issues with him.  I did provide a steroid injection of the trochanteric area on the left side and this gave him immediate relief.  I will try a 6-day steroid taper as well as diclofenac.  We will see him back in 3 months to see if he needs a repeat injection or not.  I have encouraged him to try the stretching exercises that I showed him twice daily starting tomorrow.  All question concerns were answered and addressed.  Follow-Up Instructions: Return in about 3 months (around 12/05/2018).   Orders:  Orders Placed This Encounter  Procedures  . Large Joint Inj  . XR HIP UNILAT W OR W/O PELVIS 1V LEFT   Meds ordered this encounter  Medications  . methylPREDNISolone (MEDROL) 4 MG tablet    Sig: Medrol dose pack. Take as instructed    Dispense:  21 tablet    Refill:  0  . diclofenac (VOLTAREN) 75 MG EC tablet    Sig: Take 1 tablet (75 mg total) by mouth 2 (two) times daily between meals as needed.    Dispense:  60 tablet    Refill:  3      Procedures: Large Joint Inj: L greater trochanter on 09/05/2018 2:58 PM Indications: pain and diagnostic evaluation Details: 22 G 1.5 in needle, lateral approach  Arthrogram: No  Medications: 3 mL lidocaine 1 %; 40 mg methylPREDNISolone acetate 40 MG/ML Outcome: tolerated well, no immediate complications Procedure, treatment alternatives, risks and benefits explained, specific risks discussed. Consent was given by the patient. Immediately prior  to procedure a time out was called to verify the correct patient, procedure, equipment, support staff and site/side marked as required. Patient was prepped and draped in the usual sterile fashion.       Clinical Data: No additional findings.   Subjective: Chief Complaint  Patient presents with  . Left Hip - Pain  The patient is well-known to me.  He has a history of both his hips being replaced with a left from back in 2015.  The right one was done about 16 months ago.  He says the right ones are bothering him at all the left hip is been hurting him.  He points to the side of the hip as a source of his pain.  Denies any groin pain.  He is 73 years old.  He does weigh 243 pounds.  He has had hard time going up and down stairs.  He is using a cane in his opposite hand.  Has not taken any anti-inflammatories.  He is not a diabetic.  He states that it is been giving him a fit.  HPI  Review of Systems He currently denies any headache, chest pain, shortness of breath, fever, chills, nausea, vomiting  Objective: Vital Signs: There were  no vitals taken for this visit.  Physical Exam Is alert and orient x3 and in no acute distress Ortho Exam Examination of both hips show the hip fluid internal and external rotation with no pain in the groin at all.  He does have pain to palpation around the trochanteric area on the left side.  When I had him lie flat on his back I showed him stretching exercises and this did reproduce his pain on the lateral aspect of the right hip. Specialty Comments:  No specialty comments available.  Imaging: Xr Hip Unilat W Or W/o Pelvis 1v Left  Result Date: 09/05/2018 An AP pelvis and a lateral of the left hip shows bilateral total hip arthroplasties with no complicating features.    PMFS History: Patient Active Problem List   Diagnosis Date Noted  . 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 Presence of right artificial hip joint 06/02/2017  .  Unilateral primary osteoarthritis, right hip 04/23/2017  . Status post total replacement of right hip 04/23/2017  . Arthritis of left hip 11/14/2013  . Status post THR (total hip replacement) 11/14/2013  . Unspecified gastritis and gastroduodenitis without mention of hemorrhage 08/15/2013  . Gastric polyp 08/15/2013   Past Medical History:  Diagnosis Date  . Arthritis   . Bleeding per rectum 08/2013   "black and bright red" (08/14/2013)  . Hypercholesteremia   . Obesity   . Prostate cancer ([place]) 2011  . Unilateral primary osteoarthritis, right hip 04/23/2017    Family History  Problem Relation Age of Onset  . Diabetes Sister   . Diabetes Mother   . Breast cancer Mother   . Colon cancer Neg Hx     Past Surgical History:  Procedure Laterality Date  . ESOPHAGOGASTRODUODENOSCOPY N/A 08/15/2013   Procedure: ESOPHAGOGASTRODUODENOSCOPY (EGD);  Surgeon: Milus Banister, MD;  Location: [facility];  Service: Endoscopy;  Laterality: N/A;  . INGUINAL HERNIA REPAIR Bilateral ~ 2004  . ROBOT ASSISTED LAPAROSCOPIC RADICAL PROSTATECTOMY  08/2009   [name] 08/14/2009 (08/14/2013)  . TOTAL HIP ARTHROPLASTY Left 11/14/2013   Procedure: LEFT TOTAL HIP ARTHROPLASTY ANTERIOR APPROACH;  Surgeon: Mcarthur Rossetti, MD;  Location: [facility];  Service: Orthopedics;  Laterality: Left;  . TOTAL HIP ARTHROPLASTY Right 04/23/2017   Procedure: RIGHT TOTAL HIP ARTHROPLASTY ANTERIOR APPROACH;  Surgeon: Mcarthur Rossetti, MD;  Location: [facility];  Service: Orthopedics;  Laterality: Right;   Social History   Occupational History  . Occupation: retired  Tobacco Use  . Smoking status: Never Smoker  . Smokeless tobacco: Never Used  Substance and Sexual Activity  . Alcohol use: Yes    Comment: 08/14/2013 "might have a drink q other holiday"  . Drug use: No  . Sexual activity: Not Currently

## 2018-09-06 ENCOUNTER — Other Ambulatory Visit (INDEPENDENT_AMBULATORY_CARE_PROVIDER_SITE_OTHER): Payer: Self-pay

## 2018-09-06 MED ORDER — METHYLPREDNISOLONE 4 MG PO TABS: ORAL_TABLET | ORAL | 0 refills | Status: DC

## 2018-10-14 ENCOUNTER — Telehealth (INDEPENDENT_AMBULATORY_CARE_PROVIDER_SITE_OTHER): Payer: Self-pay | Admitting: Orthopaedic Surgery

## 2018-10-14 NOTE — Telephone Encounter (Signed)
I need to see him in the office first.  Don't usually put people on stronger pain meds at all at this point and not for hip bursitis.

## 2018-10-14 NOTE — Telephone Encounter (Signed)
Please advise 

## 2018-10-14 NOTE — Telephone Encounter (Signed)
Patient is schedule Wednesday 10/19/2018 at 8:30 am with Dr. Ninfa Linden

## 2018-10-14 NOTE — Telephone Encounter (Signed)
Patient called stating that he received an injection at his last appointment with Dr. Ninfa Linden.  He said that his hip is still hurting and only has a few pills left and would like something stronger than what he was prescribed.  CB#[phone].  Thank you.

## 2018-10-14 NOTE — Telephone Encounter (Signed)
Needs appointment before Lance Rollins will call in something stronger Can you make this please? Thanks

## 2018-10-19 ENCOUNTER — Ambulatory Visit (INDEPENDENT_AMBULATORY_CARE_PROVIDER_SITE_OTHER): Payer: Medicare Other | Admitting: Orthopaedic Surgery

## 2018-10-19 ENCOUNTER — Encounter (INDEPENDENT_AMBULATORY_CARE_PROVIDER_SITE_OTHER): Payer: Self-pay | Admitting: Orthopaedic Surgery

## 2018-10-19 ENCOUNTER — Other Ambulatory Visit: Payer: Self-pay

## 2018-10-19 DIAGNOSIS — Z96642 Presence of left artificial hip joint: Secondary | ICD-10-CM | POA: Diagnosis not present

## 2018-10-19 DIAGNOSIS — M25552 Pain in left hip: Secondary | ICD-10-CM | POA: Diagnosis not present

## 2018-10-19 NOTE — Addendum Note (Signed)
Addended byLaurann Montana on: 10/19/2018 01:36 PM   Modules accepted: Orders

## 2018-10-19 NOTE — Progress Notes (Signed)
Mr. Lance Rollins is a 73 year old gentleman who continues to have significant left hip pain about 5 years now after a left direct anterior total hip arthroplasty.  We have tried activity modification and strengthening exercises.  I felt that it was more of a trochanteric bursa issue.  He is 243 pounds.  He has had both hips replaced but his right hip is never bothered him.  He continues to have problems with especially when he takes his first up in the morning getting out of bed.  X-rays that I obtained just in January did not show any significant worrisome features.  There was slight lucency at the shoulder of the stem but otherwise no subsidence.  On exam I can easily put his left hip the range of motion but he does seem to have some trochanteric pain but some deep thigh pain.  At this point I would like to obtain a three-phase bone scan to rule out prosthetic loosening of the left hip replacement as well as an MRI to rule out these have a fluid collection or other worrisome features given that is been 5 years.  We will see him back once the studies are obtained.  All question concerns were answered addressed.  I have told him I would like to try to not provide narcotics anymore this standpoint so we can get a better idea of what truly hurting.

## 2018-11-02 ENCOUNTER — Ambulatory Visit (INDEPENDENT_AMBULATORY_CARE_PROVIDER_SITE_OTHER): Payer: Medicare Other | Admitting: Orthopaedic Surgery

## 2018-12-05 ENCOUNTER — Ambulatory Visit (INDEPENDENT_AMBULATORY_CARE_PROVIDER_SITE_OTHER): Payer: Medicare Other | Admitting: Orthopaedic Surgery

## 2019-01-06 ENCOUNTER — Ambulatory Visit
Admission: RE | Admit: 2019-01-06 | Discharge: 2019-01-06 | Disposition: A | Discharge status: Home / Self Care | Payer: Medicare Other | Source: Ambulatory Visit | Attending: Orthopaedic Surgery | Admitting: Orthopaedic Surgery

## 2019-01-06 ENCOUNTER — Other Ambulatory Visit: Payer: Self-pay

## 2019-01-06 DIAGNOSIS — M25552 Pain in left hip: Secondary | ICD-10-CM

## 2019-01-06 DIAGNOSIS — Z96642 Presence of left artificial hip joint: Secondary | ICD-10-CM

## 2019-01-09 ENCOUNTER — Ambulatory Visit (HOSPITAL_COMMUNITY)
Admission: RE | Admit: 2019-01-09 | Discharge: 2019-01-09 | Disposition: A | Discharge status: Home / Self Care | Payer: Medicare Other | Source: Ambulatory Visit | Attending: Orthopaedic Surgery | Admitting: Orthopaedic Surgery

## 2019-01-09 ENCOUNTER — Other Ambulatory Visit: Payer: Self-pay

## 2019-01-09 DIAGNOSIS — Z96642 Presence of left artificial hip joint: Secondary | ICD-10-CM | POA: Insufficient documentation

## 2019-01-09 MED ORDER — TECHNETIUM TC 99M MEDRONATE IV KIT
21.7000 | PACK | Freq: Once | INTRAVENOUS | Status: AC | PRN
  Administered 2019-01-09: 11:00:00 21.7 via INTRAVENOUS

## 2019-01-11 ENCOUNTER — Telehealth: Payer: Self-pay | Admitting: Orthopaedic Surgery

## 2019-01-11 ENCOUNTER — Ambulatory Visit: Payer: Medicare Other | Admitting: Orthopaedic Surgery

## 2019-01-11 MED ORDER — HYDROCODONE-ACETAMINOPHEN 5-325 MG PO TABS: 1.0000 | ORAL_TABLET | Freq: Four times a day (QID) | ORAL | 0 refills | Status: DC | PRN

## 2019-01-11 NOTE — Telephone Encounter (Signed)
Patient in excruciating pain with hip/ leg. Patient would like to know if a rx can be called into [facility] on [address]. Please call to advise.

## 2019-01-11 NOTE — Telephone Encounter (Signed)
Please advise 

## 2019-01-11 NOTE — Telephone Encounter (Signed)
I'll send in a few hydrocodone.

## 2019-01-18 ENCOUNTER — Other Ambulatory Visit: Payer: Self-pay

## 2019-01-18 ENCOUNTER — Ambulatory Visit (INDEPENDENT_AMBULATORY_CARE_PROVIDER_SITE_OTHER): Payer: Medicare Other | Admitting: Orthopaedic Surgery

## 2019-01-18 ENCOUNTER — Encounter: Payer: Self-pay | Admitting: Orthopaedic Surgery

## 2019-01-18 DIAGNOSIS — Z96641 Presence of right artificial hip joint: Secondary | ICD-10-CM

## 2019-01-18 DIAGNOSIS — M25552 Pain in left hip: Secondary | ICD-10-CM

## 2019-01-18 DIAGNOSIS — Z96642 Presence of left artificial hip joint: Secondary | ICD-10-CM

## 2019-01-18 MED ORDER — NABUMETONE 750 MG PO TABS: 750.0000 mg | ORAL_TABLET | Freq: Two times a day (BID) | ORAL | 3 refills | Status: DC | PRN

## 2019-01-18 MED ORDER — HYDROCODONE-ACETAMINOPHEN 5-325 MG PO TABS: 1.0000 | ORAL_TABLET | Freq: Four times a day (QID) | ORAL | 0 refills | Status: DC | PRN

## 2019-01-18 MED ORDER — METHYLPREDNISOLONE ACETATE 40 MG/ML IJ SUSP
40.0000 mg | INTRAMUSCULAR | Status: AC | PRN
  Administered 2019-01-18: 40 mg via INTRA_ARTICULAR

## 2019-01-18 MED ORDER — LIDOCAINE HCL 1 % IJ SOLN
3.0000 mL | INTRAMUSCULAR | Status: AC | PRN
  Administered 2019-01-18: 3 mL

## 2019-01-18 NOTE — Progress Notes (Signed)
Office Visit Note   Patient: Lance Rollins           Date of Birth: 11-13-1945           MRN: [id number] Visit Date: 01/18/2019              Requested by: Lawerance Cruel, [place], [place] [address] PCP: Lawerance Cruel, MD   Assessment & Plan: Visit Diagnoses:  1. Presence of left artificial hip joint   2. Pain in left hip   3. Presence of right artificial hip joint     Plan: I talked him about his studies and explained him I do not feel that he needs any type of revision surgery for this at all based on what worsening on the MRI, the plain films, three-phase bone scan and clinical exam.  I did place 1 more steroid injection around the trochanteric area.  I talked about weight loss and activity modification.  We likely need to get him in physical therapy as well.  We will see him back in 3 months and at that visit I would like an AP and lateral of the left hip only and this can be done supine.  I will try different anti-inflammatory and at least 1 more prescription for narcotics but he needs to stay off of these as much as possible and I will keep refilling this either.  Follow-Up Instructions: Return in about 3 months (around 04/20/2019).   Orders:  Orders Placed This Encounter  Procedures   Large Joint Inj   No orders of the defined types were placed in this encounter.     Procedures: Large Joint Inj: L greater trochanter on 01/18/2019 9:52 AM Indications: pain and diagnostic evaluation Details: 22 G 1.5 in needle, lateral approach  Arthrogram: No  Medications: 3 mL lidocaine 1 %; 40 mg methylPREDNISolone acetate 40 MG/ML Outcome: tolerated well, no immediate complications Procedure, treatment alternatives, risks and benefits explained, specific risks discussed. Consent was given by the patient. Immediately prior to procedure a time out was called to verify the correct patient, procedure, equipment, support staff and site/side marked as required.  Patient was prepped and draped in the usual sterile fashion.       Clinical Data: No additional findings.   Subjective: Chief Complaint  Patient presents with   Left Hip - Follow-up  The patient is continue to follow-up of left hip pain.  He is almost 5 years out from a left total hip arthroplasty.  He is 2 years out from a right total hip arthroplasty.  He had not had any pain issues until this last year with his left hip and has been over the trochanteric area of the left hip.  He is here today to follow-up after having a three-phase bone scan and an MRI of his left hip.  He says it mainly hurts with weightbearing activities.  He does use a cane.  He is significantly obese.  He is still requesting narcotic pain medications.  We have been trying to wean him from these.  HPI  Review of Systems He currently denies any headache, chest pain, shortness of breath, fever, chills, nausea, vomiting  Objective: Vital Signs: There were no vitals taken for this visit.  Physical Exam He is alert and orient x3 and in no acute distress Ortho Exam Examination of his left hip does show some mild pain to deep palpation of the trochanteric area but otherwise fluid range of motion of  that left hip. Specialty Comments:  No specialty comments available.  Imaging: No results found. The 3 phase bone scan showed only slight uptake near the lesser trochanter to suggest a stress reaction but otherwise no uptake around any aspect of the femoral component over the acetabular component.  The MRI of the left hip showed no complicating features the replacement.  There is no fluid collection or evidence of loosening.  PMFS History: Patient Active Problem List   Diagnosis Date Noted   Status post total replacement of left hip 12/01/2017   Presence of right artificial hip joint 06/02/2017   Unilateral primary osteoarthritis, right hip 04/23/2017   Status post total replacement of right hip 04/23/2017    Arthritis of left hip 11/14/2013   Status post THR (total hip replacement) 11/14/2013   Unspecified gastritis and gastroduodenitis without mention of hemorrhage 08/15/2013   Gastric polyp 08/15/2013   Past Medical History:  Diagnosis Date   Arthritis    Bleeding per rectum 08/2013   "black and bright red" (08/14/2013)   Hypercholesteremia    Obesity    Prostate cancer ([place]) 2011   Unilateral primary osteoarthritis, right hip 04/23/2017    Family History  Problem Relation Age of Onset   Diabetes Sister    Diabetes Mother    Breast cancer Mother    Colon cancer Neg Hx     Past Surgical History:  Procedure Laterality Date   ESOPHAGOGASTRODUODENOSCOPY N/A 08/15/2013   Procedure: ESOPHAGOGASTRODUODENOSCOPY (EGD);  Surgeon: Milus Banister, MD;  Location: [place];  Service: Endoscopy;  Laterality: N/A;   INGUINAL HERNIA REPAIR Bilateral ~ 2004   ROBOT ASSISTED LAPAROSCOPIC RADICAL PROSTATECTOMY  08/2009   [name] 08/14/2009 (08/14/2013)   TOTAL HIP ARTHROPLASTY Left 11/14/2013   Procedure: LEFT TOTAL HIP ARTHROPLASTY ANTERIOR APPROACH;  Surgeon: Mcarthur Rossetti, MD;  Location: [place];  Service: Orthopedics;  Laterality: Left;   TOTAL HIP ARTHROPLASTY Right 04/23/2017   Procedure: RIGHT TOTAL HIP ARTHROPLASTY ANTERIOR APPROACH;  Surgeon: Mcarthur Rossetti, MD;  [facility];  Service: Orthopedics;  Laterality: Right;   Social History   Occupational History   Occupation: retired  Tobacco Use   Smoking status: Never Smoker   Smokeless tobacco: Never Used  Substance and Sexual Activity   Alcohol use: Yes    Comment: 08/14/2013 "might have a drink q other holiday"   Drug use: No   Sexual activity: Not Currently

## 2019-02-03 ENCOUNTER — Telehealth: Payer: Self-pay | Admitting: Orthopaedic Surgery

## 2019-02-03 MED ORDER — HYDROCODONE-ACETAMINOPHEN 5-325 MG PO TABS: 1.0000 | ORAL_TABLET | Freq: Four times a day (QID) | ORAL | 0 refills | Status: DC | PRN

## 2019-02-03 NOTE — Telephone Encounter (Signed)
Please advise 

## 2019-02-03 NOTE — Telephone Encounter (Signed)
I called patient and advised. 

## 2019-02-03 NOTE — Telephone Encounter (Signed)
Patient called requesting a refill of Hydrocodone.  Please call patient to advise.  [phone]

## 2019-02-03 NOTE — Telephone Encounter (Signed)
I sent some in just one more time.  Tell him I can't provide any more narcotics after this last time. Thanks.

## 2019-03-03 ENCOUNTER — Telehealth: Payer: Self-pay | Admitting: Orthopaedic Surgery

## 2019-03-03 DIAGNOSIS — G894 Chronic pain syndrome: Secondary | ICD-10-CM

## 2019-03-03 NOTE — Telephone Encounter (Signed)
Patient called asked if there is another medication he can take other than the (Hydrocodone)? Patient asked if he can get something to take along with The Rx (Nabumetone) The number to contact patient is [phone]

## 2019-03-03 NOTE — Telephone Encounter (Signed)
Please advise thanks.

## 2019-03-03 NOTE — Telephone Encounter (Signed)
There is nothing else I can recommend.  Would he like a referral to Pain Management.  If so, please make the referral. Thanks.

## 2019-03-03 NOTE — Telephone Encounter (Signed)
I s/w patient He wanted referral Referral made

## 2019-03-06 ENCOUNTER — Telehealth: Payer: Self-pay | Admitting: Orthopaedic Surgery

## 2019-03-06 NOTE — Telephone Encounter (Signed)
Patient called to get a status on his medication refill.  I did not give the patient the message from Dr. Ninfa Linden.  CB#[phone].  Thank you.

## 2019-03-06 NOTE — Telephone Encounter (Signed)
Patient called about a refill for Hydrocodone.  Please call patient @ [phone]

## 2019-03-06 NOTE — Telephone Encounter (Signed)
Please advise 

## 2019-03-06 NOTE — Telephone Encounter (Signed)
From our last visit, I want him to be off of hydrocodone at this standpoint.  This is something I am not comfortable with keeping him on.  See if he would like to be referred to chronic pain management.

## 2019-03-07 ENCOUNTER — Telehealth: Payer: Self-pay | Admitting: Orthopaedic Surgery

## 2019-03-07 NOTE — Telephone Encounter (Signed)
FYI Received call from patient (Read note from Dr Ninfa Linden to patient) Patient said ok to being referred to pain management

## 2019-03-28 ENCOUNTER — Encounter
Payer: Medicare Other | Attending: Physical Medicine and Rehabilitation | Admitting: Physical Medicine and Rehabilitation

## 2019-03-28 ENCOUNTER — Other Ambulatory Visit: Payer: Self-pay

## 2019-03-28 ENCOUNTER — Encounter: Payer: Self-pay | Admitting: Physical Medicine and Rehabilitation

## 2019-03-28 VITALS — BP 173/99 | HR 92 | Temp 98.5°F | Resp 16 | Ht 67.5 in | Wt 258.4 lb

## 2019-03-28 DIAGNOSIS — M7061 Trochanteric bursitis, right hip: Secondary | ICD-10-CM

## 2019-03-28 DIAGNOSIS — Z5181 Encounter for therapeutic drug level monitoring: Secondary | ICD-10-CM | POA: Insufficient documentation

## 2019-03-28 DIAGNOSIS — G894 Chronic pain syndrome: Secondary | ICD-10-CM | POA: Diagnosis not present

## 2019-03-28 DIAGNOSIS — M7062 Trochanteric bursitis, left hip: Secondary | ICD-10-CM | POA: Diagnosis present

## 2019-03-28 DIAGNOSIS — Z96643 Presence of artificial hip joint, bilateral: Secondary | ICD-10-CM | POA: Insufficient documentation

## 2019-03-28 MED ORDER — CYCLOBENZAPRINE HCL 10 MG PO TABS: 10.0000 mg | ORAL_TABLET | Freq: Three times a day (TID) | ORAL | 5 refills | Status: DC | PRN

## 2019-03-28 NOTE — Progress Notes (Signed)
Subjective:    Patient ID: Lance Rollins, male    DOB: Dec 03, 1945, 73 y.o.   MRN: [id number]  HPI   CC: L>R hip pain  Patient is a 73 yr old R handed male with hx of: prostate cancer in 2011, obesity, DJD- B/L hip replacements L 5 yrs ago and R 2 years ago, and HLD here for L>R hip pain.  PCP Dr Harle Battiest  L hip pain comes from side and radiates to the front- bothering him last Sept/October 2019- off and on- some days can walk fine, and some days has to use cane.  Pain described as aching/throbbing, esp when needs to take step- when brings hip forward- OK when he walks regularly, but not when rests and then gets up; will catch and then hurts when puts on floor.   R hip/front of thigh will burn intermittently- started hurting since COVID started.mainly on lateral aspect- burning sensation-  - goes down the leg- to mid thigh.   Has noticed lateral legs are REAL tight- intermittently   Never had PT for this pain. Had steroid injections for trochanteric bursitis- just did L one on 01/18/19- never had injections in R side.  Injections NOT helpful- only lasted ~ 5-7 days at most.  Tried: Injections- not real helpful Narcotics- tak edge off, but don't treat problem    Was weighing 245-before COVID; now 258 lbs now-  Used to run a lot- since before pandemic- 1-1.5 miles at the gym- on treadmill- member of [employer].  Pain Inventory Average Pain 9 Pain Right Now 5 My pain is burning, tingling and aching  In the last 24 hours, has pain interfered with the following? General activity 7 Relation with others 7 Enjoyment of life 7 What TIME of day is your pain at its worst? morning evening and night Sleep (in general) Fair  Pain is worse with: walking, bending, sitting, inactivity, standing and some activites Pain improves with: medication Relief from Meds: 2  Mobility walk with assistance use a cane how many minutes can you walk? 20 ability to climb steps?  yes do you  drive?  yes Do you have any goals in this area?  yes  Function retired  Neuro/Psych tingling  Prior Studies Any changes since last visit?  no  Physicians involved in your care Any changes since last visit?  no   Family History  Problem Relation Age of Onset  . Diabetes Sister   . Diabetes Mother   . Breast cancer Mother   . Colon cancer Neg Hx    Social History   Socioeconomic History  . Marital status: Married    Spouse name: Not on file  . Number of children: 5  . Years of education: Not on file  . Highest education level: Not on file  Occupational History  . Occupation: retired  Scientific laboratory technician  . Financial resource strain: Not on file  . Food insecurity    Worry: Not on file    Inability: Not on file  . Transportation needs    Medical: Not on file    Non-medical: Not on file  Tobacco Use  . Smoking status: Never Smoker  . Smokeless tobacco: Never Used  Substance and Sexual Activity  . Alcohol use: Yes    Comment: 08/14/2013 "might have a drink q other holiday"  . Drug use: No  . Sexual activity: Not Currently  Lifestyle  . Physical activity    Days per week: Not on file  Minutes per session: Not on file  . Stress: Not on file  Relationships  . Social Herbalist on phone: Not on file    Gets together: Not on file    Attends religious service: Not on file    Active member of club or organization: Not on file    Attends meetings of clubs or organizations: Not on file    Relationship status: Not on file  Other Topics Concern  . Not on file  Social History Narrative  . Not on file   Past Surgical History:  Procedure Laterality Date  . ESOPHAGOGASTRODUODENOSCOPY N/A 08/15/2013   Procedure: ESOPHAGOGASTRODUODENOSCOPY (EGD);  Surgeon: Milus Banister, MD;  Location: [place];  Service: Endoscopy;  Laterality: N/A;  . INGUINAL HERNIA REPAIR Bilateral ~ 2004  . ROBOT ASSISTED LAPAROSCOPIC RADICAL PROSTATECTOMY  08/2009   [name] 08/14/2009  (08/14/2013)  . TOTAL HIP ARTHROPLASTY Left 11/14/2013   Procedure: LEFT TOTAL HIP ARTHROPLASTY ANTERIOR APPROACH;  Surgeon: Mcarthur Rossetti, MD;  Location: [place];  Service: Orthopedics;  Laterality: Left;  . TOTAL HIP ARTHROPLASTY Right 04/23/2017   Procedure: RIGHT TOTAL HIP ARTHROPLASTY ANTERIOR APPROACH;  Surgeon: Mcarthur Rossetti, MD;  [facility];  Service: Orthopedics;  Laterality: Right;   Past Medical History:  Diagnosis Date  . Arthritis   . Bleeding per rectum 08/2013   "black and bright red" (08/14/2013)  . Hypercholesteremia   . Obesity   . Prostate cancer ([place]) 2011  . Unilateral primary osteoarthritis, right hip 04/23/2017   There were no vitals taken for this visit.   Lives in 2 story house- 2 STE; MBR upstairs with bath Nonsmoker   Opioid Risk Score:   Fall Risk Score:  `1  Depression screen PHQ 2/9  No flowsheet data found.   Review of Systems  Constitutional: Negative.   HENT: Negative.   Eyes: Negative.   Respiratory: Negative.   Cardiovascular: Negative.   Gastrointestinal: Negative.   Endocrine: Negative.   Genitourinary: Negative.   Musculoskeletal: Positive for gait problem and myalgias.  Skin: Negative.   Allergic/Immunologic: Negative.   Hematological: Negative.   Psychiatric/Behavioral: Negative.   All other systems reviewed and are negative.      Objective:   Physical Exam Awake, alert, appropriate, appears younger than stated age, sitting comfortably on table, bright orange shirt, NAD ROM of L>R is tight esp with hip external rotation and internal rotation; intact in abduction and adduction B/L MS: 5/5 in LEs  B/L No weakness noted Very TTP over trochanteric bursa on L; not on R Very tight IT bands L>R No TTP over piriformis, low back, no trigger point palpated in low back No size to vastus medialis B/L- mild atrophy seen No skin breakdown seen on limbs x4         Assessment & Plan:    1. HTN with high BP of  173/108- will recheck- gets white coat syndrome 173/99 on 2nd testing- at end of visit- has this issue at home, per pt.  In pain, but not 10/10 - strongly suggest something low/something maybe as needed, based on BP.  2. Trochanteric bursitis L>R with tight IT bands B/L- thigh muscles aren't balanced currently and very tight IT bands-  - Flexeril/cyclobenzaprine- 10 mg 2x/day as needed - Diclofenac- try 75 mg 2x/day as needed- already has Rx so have him continue -no opiates for now, since just covers pain, doesn't treat cause.  3. F/U 1 month- for trochanteric bursa injection- if  wants- B/L

## 2019-03-28 NOTE — Patient Instructions (Addendum)
  1. HTN with high BP of 173/108- will recheck- gets white coat syndrome 173/99 on 2nd testing- at end of visit- has this issue at home, per pt.  In pain, but not 10/10 - strongly suggest something low/something maybe as needed, based on BP.   2. Trochanteric bursitis L>R with tight IT bands B/L- thigh muscles aren't balanced currently and very tight IT bands-  - Flexeril/cyclobenzaprine- 10 mg 2x/day as needed - Diclofenac- try 75 mg 2x/day as needed- already has Rx so have him continue -no opiates for now, since just covers pain, doesn't treat cause.  3. F/U 1 month- for trochanteric bursa injection

## 2019-03-29 ENCOUNTER — Ambulatory Visit: Payer: Medicare Other | Attending: Physical Medicine and Rehabilitation | Admitting: Physical Therapy

## 2019-03-29 DIAGNOSIS — M25551 Pain in right hip: Secondary | ICD-10-CM | POA: Insufficient documentation

## 2019-03-29 DIAGNOSIS — R262 Difficulty in walking, not elsewhere classified: Secondary | ICD-10-CM

## 2019-03-29 DIAGNOSIS — M6281 Muscle weakness (generalized): Secondary | ICD-10-CM | POA: Diagnosis present

## 2019-03-29 DIAGNOSIS — M25552 Pain in left hip: Secondary | ICD-10-CM | POA: Insufficient documentation

## 2019-03-29 NOTE — Therapy (Signed)
[facility] [address] [place], [place], [address] Phone: [phone]   Fax:  [phone]  Physical Therapy Evaluation  Patient Details  Name: Lance Rollins MRN: [id number] Date of Birth: [birth year] Referring Provider (PT): Lovorn, Megan,MD   Encounter Date: 03/29/2019  PT End of Session - 03/29/19 1559    Visit Number  1    Number of Visits  13    Date for PT Re-Evaluation  05/24/19    Authorization Type  Medicare: Kx modifier after visit 15    PT Start Time  0626    PT Stop Time  1520    PT Time Calculation (min)  55 min    Activity Tolerance  Patient tolerated treatment well    Behavior During Therapy  [facility] for tasks assessed/performed       Past Medical History:  Diagnosis Date  . Arthritis   . Bleeding per rectum 08/2013   "black and bright red" (08/14/2013)  . Hypercholesteremia   . Obesity   . Prostate cancer ([place]) 2011  . Unilateral primary osteoarthritis, right hip 04/23/2017    Past Surgical History:  Procedure Laterality Date  . ESOPHAGOGASTRODUODENOSCOPY N/A 08/15/2013   Procedure: ESOPHAGOGASTRODUODENOSCOPY (EGD);  Surgeon: Milus Banister, MD;  Location: [place];  Service: Endoscopy;  Laterality: N/A;  . INGUINAL HERNIA REPAIR Bilateral ~ 2004  . ROBOT ASSISTED LAPAROSCOPIC RADICAL PROSTATECTOMY  08/2009   [name] 08/14/2009 (08/14/2013)  . TOTAL HIP ARTHROPLASTY Left 11/14/2013   Procedure: LEFT TOTAL HIP ARTHROPLASTY ANTERIOR APPROACH;  Surgeon: Mcarthur Rossetti, MD;  Location: [place];  Service: Orthopedics;  Laterality: Left;  . TOTAL HIP ARTHROPLASTY Right 04/23/2017   Procedure: RIGHT TOTAL HIP ARTHROPLASTY ANTERIOR APPROACH;  Surgeon: Mcarthur Rossetti, MD;  [facility];  Service: Orthopedics;  Laterality: Right;    There were no vitals filed for this visit.   Subjective Assessment - 03/29/19 1549    Subjective  Pt arriving to therapy reporting 5 year history of L hip pain  since his THR in 2015 and since 2018 s/p R THR. Pt reporting the L is worse than the R. Pt reporting pain of 9/10 in his L hip and 6/10 in his R hip. Pt reports he has been sitting more since COVID began but was outside yesterday mowing his lawn.    Pertinent History  OA, obesity, hypercholesterolimia, prostate CA, rectal bleeding, white coat syndrome, R THR, L THR    Patient Stated Goals  Stop hurting, walk without pain and cramping    Currently in Pain?  Yes    Pain Score  9     Pain Location  Hip    Pain Orientation  Left    Pain Descriptors / Indicators  Aching;Sore    Pain Type  Chronic pain    Pain Onset  More than a month ago    Pain Frequency  Constant    Aggravating Factors   sitting prolonged, standing in one place, walking long distances    Pain Relieving Factors  changing positions    Effect of Pain on Daily Activities  painful doing houshold chores    Multiple Pain Sites  Yes    Pain Score  6    Pain Location  Hip    Pain Orientation  Right    Pain Descriptors / Indicators  Aching    Pain Type  Chronic pain    Pain Onset  More than a month ago    Pain  Frequency  Constant    Aggravating Factors   see above    Pain Relieving Factors  see above    Effect of Pain on Daily Activities  see above         [facility] PT Assessment - 03/29/19 0001      Assessment   Medical Diagnosis  bilateral hip pain    Referring Provider (PT)  Lovorn, Megan,MD    Onset Date/Surgical Date  --   L 2015, R 2018 THR   Hand Dominance  Right    Prior Therapy  no      Precautions   Precautions  None      Restrictions   Weight Bearing Restrictions  No      Balance Screen   Has the patient fallen in the past 6 months  No    Is the patient reluctant to leave their home because of a fear of falling?   No      Home Film/video editor residence    Living Arrangements  Spouse/significant other    Home Access  Stairs to enter      Prior Function   Level of Independence   Independent    Vocation  Retired    Leisure  I enjoy working in my yard      Cognition   Overall Cognitive Status  Within [place] for tasks assessed      Observation/Other Assessments   Focus on Therapeutic Outcomes (FOTO)   Please perform at next visit, unable to access      Posture/Postural Control   Posture/Postural Control  Postural limitations    Postural Limitations  Rounded Shoulders;Forward head;Decreased lumbar lordosis;Increased thoracic kyphosis      ROM / Strength   AROM / PROM / Strength  AROM;Strength      Strength   Overall Strength Comments  knees grossly 5/5    Strength Assessment Site  Hip    Right/Left Hip  Right;Left    Right Hip Flexion  4/5    Right Hip Extension  4-/5    Right Hip ABduction  4-/5    Right Hip ADduction  4-/5    Left Hip Flexion  4-/5    Left Hip Extension  4-/5    Left Hip ABduction  4-/5    Left Hip ADduction  4-/5      Flexibility   Soft Tissue Assessment /Muscle Length  yes    Hamstrings  R: 60 degrees, L 55 degrees      Palpation   Palpation comment  TTP bilateral iliopsoas, extreme tightness noted       Special Tests    Special Tests  Hip Special Tests    Hip Special Tests   [facility] Test    Findings  Positive    Side  Left    Comments  + on R      Transfers   Five time sit to stand comments   19.2 seconds using UE support      Ambulation/Gait   Assistive device  Straight cane    Gait Pattern  Step-through pattern;Decreased stance time - left;Poor foot clearance - left    Gait Comments  Pt was instructed to make sure his cane hit the ground with each step of his L LE for safety and support.       Balance   Balance Assessed  --   SLS R: 4 seconds, SLS  on L 3 seconds               Objective measurements completed on examination: See above findings.              PT Education - 03/29/19 1557    Education Details  HEP, handout issued, unable to copy and paste into pt  instructions access code: [email or web address]    Person(s) Educated  Patient    Methods  Explanation;Demonstration;Handout;Verbal cues    Comprehension  Verbalized understanding;Returned demonstration;Verbal cues required          PT Long Term Goals - 03/29/19 1607      PT LONG TERM GOAL #1   Title  Pt will be able to improve bilateral hip strength to grossly 5/5 in order to improve functional mobility.    Baseline  grossly -4/5 bilateral hips    Time  6    Status  New    Target Date  05/10/19      PT LONG TERM GOAL #2   Title  Pt will be able to perform SLS on bilateral LE's 10 seconds safely in order to improve functional mobility.    Baseline  R: 4 seconds, L: 3 seconds    Time  6    Period  Weeks    Status  New    Target Date  05/10/19      PT LONG TERM GOAL #3   Title  Pt will be able to report pain </= 4/10 in bilateral hips while performing daily ADL's and household chores.    Baseline  reporting pain in L hip today of 9/10 and 6/10 in R hip.    Time  6    Period  Weeks    Status  New    Target Date  05/10/19             Plan - 03/29/19 1601    Clinical Impression Statement  Pt presenting with for PT evaluation with dx of trochangeric bursitis L>R. pt reporting pain since hip replacements in 2015 on the L and 2018 on the R. Pt presenting today with weakness in bilateral hip abduction, hip extension and hip dduction. pt with tightness in bilateral hip flexors with + Thomas Test bilaterally. Pt with decrreased hamstring flexiblity R: 60 degrees and L 55 degrees. Pt also with +1 pitting edema in bilateral  ankles. Pt could benefit from skilled PT to address the listed impairments with the below interventions.    Personal Factors and Comorbidities  Comorbidity 3+    Comorbidities  white coat symdrome, obesity, OA, hyperlipidemia, prostate CA, h/o R and L THR.    Examination-Activity Limitations  Sit;Squat    Examination-Participation Restrictions  Church;Community  Activity;Yard Work    Stability/Clinical Decision Making  Stable/Uncomplicated    Clinical Decision Making  Low    Rehab Potential  Good    PT Frequency  2x / week    PT Duration  6 weeks    PT Treatment/Interventions  Cryotherapy;Electrical Stimulation;Iontophoresis 4mg /ml Dexamethasone;Moist Heat;Gait training;Stair training;Functional mobility training;Therapeutic activities;Therapeutic exercise;Balance training;Neuromuscular re-education;Patient/family education;Manual techniques;Passive range of motion;Dry needling;Taping    PT Next Visit Plan  LE stretching, LE hip strengthteing, modalities as needed, review HEP    PT Home Exercise Plan  hamstring stretching, hip flexor stretch over EOB, bridges    Consulted and Agree with Plan of Care  Patient       Patient will benefit from skilled therapeutic intervention in order to improve the following deficits  and impairments:  Obesity, Pain, Difficulty walking, Decreased strength, Decreased mobility, Postural dysfunction, Impaired flexibility  Visit Diagnosis: 1. Pain in left hip   2. Pain in right hip   3. Difficulty in walking, not elsewhere classified   4. Muscle weakness (generalized)        Problem List Patient Active Problem List   Diagnosis Date Noted  . Chronic pain syndrome 03/28/2019  . Trochanteric bursitis of both hips 03/28/2019  . 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 Presence of right artificial hip joint 06/02/2017  . Unilateral primary osteoarthritis, right hip 04/23/2017  . Status post total replacement of right hip 04/23/2017  . Arthritis of left hip 11/14/2013  . Status post THR (total hip replacement) 11/14/2013  . Unspecified gastritis and gastroduodenitis without mention of hemorrhage 08/15/2013  . Gastric polyp 08/15/2013    Oretha Caprice, PT 03/29/2019, 4:20 PM  [facility] [address], [place], [place],  [address] Phone: [phone]   Fax:  [phone]  Name: Lance Rollins MRN: [id number] Date of Birth: 21-Jun-1946

## 2019-03-31 ENCOUNTER — Other Ambulatory Visit: Payer: Self-pay

## 2019-03-31 ENCOUNTER — Ambulatory Visit: Payer: Medicare Other | Admitting: Physical Therapy

## 2019-03-31 ENCOUNTER — Encounter: Payer: Self-pay | Admitting: Physical Therapy

## 2019-03-31 DIAGNOSIS — M6281 Muscle weakness (generalized): Secondary | ICD-10-CM

## 2019-03-31 DIAGNOSIS — M25552 Pain in left hip: Secondary | ICD-10-CM

## 2019-03-31 DIAGNOSIS — R262 Difficulty in walking, not elsewhere classified: Secondary | ICD-10-CM

## 2019-03-31 DIAGNOSIS — M25551 Pain in right hip: Secondary | ICD-10-CM

## 2019-03-31 LAB — DRUG TOX MONITOR 1 W/CONF, ORAL FLD
Amobarbital: NEGATIVE ng/mL (ref <10)
Amphetamines: NEGATIVE ng/mL (ref <10)
Barbiturates: NEGATIVE ng/mL (ref <10)
Benzodiazepines: NEGATIVE ng/mL (ref <0.50)
Buprenorphine: NEGATIVE ng/mL (ref <0.10)
Butalbital: NEGATIVE ng/mL (ref <10)
Cocaine: NEGATIVE ng/mL (ref <5.0)
Fentanyl: NEGATIVE ng/mL (ref <0.10)
Heroin Metabolite: NEGATIVE ng/mL (ref <1.0)
MARIJUANA: NEGATIVE ng/mL (ref <2.5)
MDMA: NEGATIVE ng/mL (ref <10)
Meprobamate: NEGATIVE ng/mL (ref <2.5)
Methadone: NEGATIVE ng/mL (ref <5.0)
Nicotine Metabolite: NEGATIVE ng/mL (ref <5.0)
Opiates: NEGATIVE ng/mL (ref <2.5)
Pentobarbital: NEGATIVE ng/mL (ref <10)
Phencyclidine: NEGATIVE ng/mL (ref <10)
Phenobarbital: NEGATIVE ng/mL (ref <10)
Tapentadol: NEGATIVE ng/mL (ref <5.0)
Tramadol: NEGATIVE ng/mL (ref <5.0)
Zolpidem: NEGATIVE ng/mL (ref <5.0)

## 2019-03-31 LAB — DRUG TOX ALC METAB W/CON, ORAL FLD: Alcohol Metabolite: NEGATIVE ng/mL (ref <25)

## 2019-03-31 NOTE — Therapy (Signed)
[facility] [address], [place], [place], [address] Phone: [phone]   Fax:  [phone]  Physical Therapy Treatment  Patient Details  Name: Lance Rollins MRN: [id number] Date of Birth: 30-Jun-1946 Referring Provider (PT): Lovorn, Megan,MD   Encounter Date: 03/31/2019  PT End of Session - 03/31/19 [id number]    Visit Number  2    Date for PT Re-Evaluation  05/24/19    Authorization Type  Medicare: Kx modifier after visit 15    PT Start Time  0855    PT Stop Time  0940    PT Time Calculation (min)  45 min    Activity Tolerance  Patient tolerated treatment well    Behavior During Therapy  [name] for tasks assessed/performed       Past Medical History:  Diagnosis Date  . Arthritis   . Bleeding per rectum 08/2013   "black and bright red" (08/14/2013)  . Hypercholesteremia   . Obesity   . Prostate cancer ([place]) 2011  . Unilateral primary osteoarthritis, right hip 04/23/2017    Past Surgical History:  Procedure Laterality Date  . ESOPHAGOGASTRODUODENOSCOPY N/A 08/15/2013   Procedure: ESOPHAGOGASTRODUODENOSCOPY (EGD);  Surgeon: Milus Banister, MD;  Location: [place];  Service: Endoscopy;  Laterality: N/A;  . INGUINAL HERNIA REPAIR Bilateral ~ 2004  . ROBOT ASSISTED LAPAROSCOPIC RADICAL PROSTATECTOMY  08/2009   [name] 08/14/2009 (08/14/2013)  . TOTAL HIP ARTHROPLASTY Left 11/14/2013   Procedure: LEFT TOTAL HIP ARTHROPLASTY ANTERIOR APPROACH;  Surgeon: Mcarthur Rossetti, MD;  Location: [place];  Service: Orthopedics;  Laterality: Left;  . TOTAL HIP ARTHROPLASTY Right 04/23/2017   Procedure: RIGHT TOTAL HIP ARTHROPLASTY ANTERIOR APPROACH;  Surgeon: Mcarthur Rossetti, MD;  Location: [facility];  Service: Orthopedics;  Laterality: Right;    There were no vitals filed for this visit.  Subjective Assessment - 03/31/19 1214    Subjective  I am doing the exercises and they help so much.  I should have come to PT sooner.    Pertinent History  OA, obesity, hypercholesterolimia, prostate CA, rectal bleeding, white coat syndrome, R THR, L THR    Patient Stated Goals  Stop hurting, walk without pain and cramping    Currently in Pain?  Yes    Pain Score  6     Pain Location  Hip    Pain Orientation  Left    Pain Descriptors / Indicators  Aching    Pain Type  Chronic pain    Pain Onset  More than a month ago    Aggravating Factors   prolonged sitting, standing, walking long distances    Pain Relieving Factors  changing positions, stretches from HEP    Effect of Pain on Daily Activities  household chores         [name] PT Assessment - 03/31/19 0001      Observation/Other Assessments   Focus on Therapeutic Outcomes (FOTO)   42%, goal 30                   [facility] Adult PT Treatment/Exercise - 03/31/19 0001      Exercises   Exercises  Lumbar;Knee/Hip      Lumbar Exercises: Supine   Clam  20 reps    Clam Limitations  blue band    Bridge  20 reps    Other Supine Lumbar Exercises  hip flexion feet on ball bil x 15 reps for ROM      Knee/Hip Exercises: Stretches  Active Hamstring Stretch  Both;30 seconds;1 rep    Active Hamstring Stretch Limitations  standing foot on 2nd step, seated hip hinge for options at home    Hip Flexor Stretch  Both;30 seconds;1 rep    Hip Flexor Stretch Limitations  foot on second step, lunge style    Piriformis Stretch  Both;1 rep;30 seconds    Piriformis Stretch Limitations  strap to assist, figure 4    Other Knee/Hip Stretches  SKTC with strap 2x30 sec      Knee/Hip Exercises: Aerobic   Nustep  L1 x 8' seat 13      Knee/Hip Exercises: Supine   Other Supine Knee/Hip Exercises  clamshell blue band (HEP)    Other Supine Knee/Hip Exercises  hamstring curls feet on ball in supine x 10 reps bil      Manual Therapy   Manual Therapy  Passive ROM    Passive ROM  bil hip ROM flexion, flex/abd, IR, ER bil                   PT Long Term Goals - 03/31/19 1220       PT LONG TERM GOAL #3   Title  Pt will be able to report pain </= 4/10 in bilateral hips while performing daily ADL's and household chores.    Status  On-going            Plan - 03/31/19 1216    Clinical Impression Statement  Pt with signif stiffness in all planes of ROM and multiple muscle groups in bil LEs.  He reports significant relief with ROM and stretching and is compliant with initial HEP, reporting relief with stretches.  PT updated HEP for further flexibility and ROM.  Pt appeared to benefit greatly for both pain and ROM with passive ROM by PT for bil hips before initiating stretching.  Pt has large exercise ball at home so PT added heels on ball knees to chest to assist ROM at home.  Pt will continue to benefit from skilled PT along current POC.    Comorbidities  white coat symdrome, obesity, OA, hyperlipidemia, prostate CA, h/o R and L THR.    Rehab Potential  Good    PT Frequency  2x / week    PT Duration  6 weeks    PT Treatment/Interventions  Cryotherapy;Electrical Stimulation;Iontophoresis 4mg /ml Dexamethasone;Moist Heat;Gait training;Stair training;Functional mobility training;Therapeutic activities;Therapeutic exercise;Balance training;Neuromuscular re-education;Patient/family education;Manual techniques;Passive range of motion;Dry needling;Taping    PT Next Visit Plan  NuStep, bil hip P/A/ROM, f/u on HEP LE stretches, update program for LE strength    PT Home Exercise Plan  [email or web address]    Consulted and Agree with Plan of Care  Patient       Patient will benefit from skilled therapeutic intervention in order to improve the following deficits and impairments:     Visit Diagnosis: Pain in left hip  Pain in right hip  Difficulty in walking, not elsewhere classified  Muscle weakness (generalized)     Problem List Patient Active Problem List   Diagnosis Date Noted  . Chronic pain syndrome 03/28/2019  . Trochanteric bursitis of both hips 03/28/2019  . Status  post total replacement of left hip 12/01/2017  . Presence of right artificial hip joint 06/02/2017  . Unilateral primary osteoarthritis, right hip 04/23/2017  . Status post total replacement of right hip 04/23/2017  . Arthritis of left hip 11/14/2013  . Status post THR (total hip replacement) 11/14/2013  . Unspecified gastritis and  gastroduodenitis without mention of hemorrhage 08/15/2013  . Gastric polyp 08/15/2013    Baruch Merl, PT 03/31/19 12:23 PM   [facility] [address] [place], [place], [address] Phone: [phone]   Fax:  [phone]  Name: Lance Rollins MRN: [id number] Date of Birth: 06-23-1946

## 2019-03-31 NOTE — Patient Instructions (Signed)
Access Code: [email or web address]  URL: [email or web address]  Date: 03/31/2019  Prepared by: Venetia Night Beuhring   Exercises  Supine Bridge - 10 reps - 5 hold - 2x daily - 7x weekly  Supine Hamstring Stretch with Strap - 5 reps - 1 sets - 30 seconds hold - 2x daily - 7x weekly  Modified Thomas Stretch - 5 reps - 1 sets - 60 seconds hold - 2x daily - 7x weekly  Standing Hamstring Stretch with Step - 3 reps - 1 sets - 30 hold - 2x daily - 7x weekly  Hip Flexor Stretch on Step - 3 reps - 1 sets - 30 hold - 2x daily - 7x weekly  Hooklying Single Knee to Chest Stretch - 10 reps - 3 sets - 30 hold - 1x daily - 7x weekly  Supine Piriformis Stretch with Foot on Ground - 3 reps - 1 sets - 30 hold - 2x daily - 7x weekly  Supine Knees to Chest with Swiss Ball - 20 reps - 3 sets - 2x daily - 7x weekly  Hooklying Isometric Clamshell - 15 reps - 2 sets - 2x daily - 7x weekly

## 2019-04-03 ENCOUNTER — Telehealth: Payer: Self-pay | Admitting: *Deleted

## 2019-04-03 NOTE — Telephone Encounter (Signed)
Oral swab drug screen was consistent for having no medications present.

## 2019-04-06 ENCOUNTER — Other Ambulatory Visit: Payer: Self-pay

## 2019-04-06 ENCOUNTER — Encounter: Payer: Self-pay | Admitting: Physical Therapy

## 2019-04-06 ENCOUNTER — Ambulatory Visit: Payer: Medicare Other | Admitting: Physical Therapy

## 2019-04-06 DIAGNOSIS — R262 Difficulty in walking, not elsewhere classified: Secondary | ICD-10-CM

## 2019-04-06 DIAGNOSIS — M25551 Pain in right hip: Secondary | ICD-10-CM

## 2019-04-06 DIAGNOSIS — M6281 Muscle weakness (generalized): Secondary | ICD-10-CM

## 2019-04-06 DIAGNOSIS — M25552 Pain in left hip: Secondary | ICD-10-CM | POA: Diagnosis not present

## 2019-04-06 NOTE — Therapy (Signed)
[facility] [address], [place], [place], [address] Phone: [phone]   Fax:  [phone]  Physical Therapy Treatment  Patient Details  Name: Lance Rollins MRN: [id number] Date of Birth: 01/30/1946 Referring Provider (PT): Lovorn, Megan,MD   Encounter Date: 04/06/2019  PT End of Session - 04/06/19 1340    Visit Number  3    Number of Visits  13    Date for PT Re-Evaluation  05/24/19    Authorization Type  Medicare: Kx modifier after visit 15    PT Start Time  1211    PT Stop Time  1255    PT Time Calculation (min)  44 min    Activity Tolerance  Patient tolerated treatment well       Past Medical History:  Diagnosis Date  . Arthritis   . Bleeding per rectum 08/2013   "black and bright red" (08/14/2013)  . Hypercholesteremia   . Obesity   . Prostate cancer ([place]) 2011  . Unilateral primary osteoarthritis, right hip 04/23/2017    Past Surgical History:  Procedure Laterality Date  . ESOPHAGOGASTRODUODENOSCOPY N/A 08/15/2013   Procedure: ESOPHAGOGASTRODUODENOSCOPY (EGD);  Surgeon: Milus Banister, MD;  Location: [place];  Service: Endoscopy;  Laterality: N/A;  . INGUINAL HERNIA REPAIR Bilateral ~ 2004  . ROBOT ASSISTED LAPAROSCOPIC RADICAL PROSTATECTOMY  08/2009   [name] 08/14/2009 (08/14/2013)  . TOTAL HIP ARTHROPLASTY Left 11/14/2013   Procedure: LEFT TOTAL HIP ARTHROPLASTY ANTERIOR APPROACH;  Surgeon: Mcarthur Rossetti, MD;  Location: [place];  Service: Orthopedics;  Laterality: Left;  . TOTAL HIP ARTHROPLASTY Right 04/23/2017   Procedure: RIGHT TOTAL HIP ARTHROPLASTY ANTERIOR APPROACH;  Surgeon: Mcarthur Rossetti, MD;  [facility];  Service: Orthopedics;  Laterality: Right;    There were no vitals filed for this visit.  Subjective Assessment - 04/06/19 1222    Subjective  PT has helped.   I need to do the exercises twice day.  Last had pain yesterday after mowing.  I ride the stationary bike every  day 5 minutes.    Pertinent History  OA, obesity, hypercholesterolimia, prostate CA, rectal bleeding, white coat syndrome, R THR, L THR    Patient Stated Goals  Stop hurting, walk without pain and cramping    Currently in Pain?  No/denies    Pain Score  0-No pain    Pain Orientation  Left;Right                       [facility] Adult PT Treatment/Exercise - 04/06/19 0001      Lumbar Exercises: Supine   Clam  20 reps    Clam Limitations  blue band single and double leg     Bridge  15 reps    Bridge Limitations  LEs on ball     Other Supine Lumbar Exercises  hip flexion feet on ball bil x 15 reps for ROM      Knee/Hip Exercises: Stretches   Active Hamstring Stretch  Right;Left;5 reps    Active Hamstring Stretch Limitations  2nd step     Hip Flexor Stretch  Right;Left;5 reps    Hip Flexor Stretch Limitations  foot on second step, lunge style      Knee/Hip Exercises: Aerobic   Nustep  L1 x 12' seat 13   while discussing status/progress     Knee/Hip Exercises: Standing   Hip Abduction  Stengthening;Right;Left;2 sets;5 reps    Abduction Limitations  green band  Hip Extension  Stengthening;Right;Left;2 sets;5 reps    Extension Limitations  green band       Knee/Hip Exercises: Seated   Sit to Sand  2 sets;10 reps;without UE support   holding 10# plate weight      Manual Therapy   Soft tissue mobilization  instrument assisted soft tissue to bil quads, ITB 5 min each                   PT Long Term Goals - 03/31/19 1220      PT LONG TERM GOAL #3   Title  Pt will be able to report pain </= 4/10 in bilateral hips while performing daily ADL's and household chores.    Status  On-going            Plan - 04/06/19 1342    Clinical Impression Statement  The patient has a positive response to manual therapy and to a progression of LE strengthening exercises.  He reports he already feels better overall with just 2 PT visits thus far.  Therapist monitoring  response with all treatment interventions.  He reports gluteal muscle fatigue appropriately with exercise and following treatment session he reports feeling better.    Comorbidities  white coat symdrome, obesity, OA, hyperlipidemia, prostate CA, h/o R and L THR.    Rehab Potential  Good    PT Frequency  2x / week    PT Duration  6 weeks    PT Treatment/Interventions  Cryotherapy;Electrical Stimulation;Iontophoresis 4mg /ml Dexamethasone;Moist Heat;Gait training;Stair training;Functional mobility training;Therapeutic activities;Therapeutic exercise;Balance training;Neuromuscular re-education;Patient/family education;Manual techniques;Passive range of motion;Dry needling;Taping    PT Next Visit Plan  NuStep, bil hip P/A/ROM, Addaday soft tissue/myofascial;   LE strengthening progression bil;  LE flexibility    PT Home Exercise Plan  [id number]       Patient will benefit from skilled therapeutic intervention in order to improve the following deficits and impairments:  Obesity, Pain, Difficulty walking, Decreased strength, Decreased mobility, Postural dysfunction, Impaired flexibility  Visit Diagnosis: Pain in left hip  Pain in right hip  Difficulty in walking, not elsewhere classified  Muscle weakness (generalized)     Problem List Patient Active Problem List   Diagnosis Date Noted  . Chronic pain syndrome 03/28/2019  . Trochanteric bursitis of both hips 03/28/2019  . 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 Presence of right artificial hip joint 06/02/2017  . Unilateral primary osteoarthritis, right hip 04/23/2017  . Status post total replacement of right hip 04/23/2017  . Arthritis of left hip 11/14/2013  . Status post THR (total hip replacement) 11/14/2013  . Unspecified gastritis and gastroduodenitis without mention of hemorrhage 08/15/2013  . Gastric polyp 08/15/2013   Ruben Im, PT 04/06/19 2:47 PM Phone: [phone] Fax: [phone] Alvera Singh 04/06/2019, 2:47 PM  [facility] [address] [place], [place], [address] Phone: [phone]   Fax:  [phone]  Name: Lance Rollins MRN: [id number] Date of Birth: 02-24-1946

## 2019-04-11 ENCOUNTER — Other Ambulatory Visit: Payer: Self-pay

## 2019-04-11 ENCOUNTER — Ambulatory Visit: Payer: Medicare Other | Attending: Physical Medicine and Rehabilitation | Admitting: Physical Therapy

## 2019-04-11 ENCOUNTER — Encounter: Payer: Self-pay | Admitting: Physical Therapy

## 2019-04-11 DIAGNOSIS — M25551 Pain in right hip: Secondary | ICD-10-CM | POA: Insufficient documentation

## 2019-04-11 DIAGNOSIS — R262 Difficulty in walking, not elsewhere classified: Secondary | ICD-10-CM | POA: Diagnosis present

## 2019-04-11 DIAGNOSIS — M6281 Muscle weakness (generalized): Secondary | ICD-10-CM | POA: Diagnosis present

## 2019-04-11 DIAGNOSIS — M25552 Pain in left hip: Secondary | ICD-10-CM | POA: Diagnosis present

## 2019-04-11 NOTE — Therapy (Signed)
[facility] [address], [place], [place], [address] Phone: [phone]   Fax:  [phone]  Physical Therapy Treatment  Patient Details  Name: Lance Rollins MRN: [id number] Date of Birth: 1946/03/25 Referring Provider (PT): Lovorn, Megan,MD   Encounter Date: 04/11/2019  PT End of Session - 04/11/19 1347    Visit Number  4    Number of Visits  13    Date for PT Re-Evaluation  05/24/19    Authorization Type  Medicare: Kx modifier after visit 15    PT Start Time  [id number]    PT Stop Time  1426    PT Time Calculation (min)  38 min    Activity Tolerance  Patient tolerated treatment well       Past Medical History:  Diagnosis Date  . Arthritis   . Bleeding per rectum 08/2013   "black and bright red" (08/14/2013)  . Hypercholesteremia   . Obesity   . Prostate cancer ([place]) 2011  . Unilateral primary osteoarthritis, right hip 04/23/2017    Past Surgical History:  Procedure Laterality Date  . ESOPHAGOGASTRODUODENOSCOPY N/A 08/15/2013   Procedure: ESOPHAGOGASTRODUODENOSCOPY (EGD);  Surgeon: Milus Banister, MD;  Location: [place];  Service: Endoscopy;  Laterality: N/A;  . INGUINAL HERNIA REPAIR Bilateral ~ 2004  . ROBOT ASSISTED LAPAROSCOPIC RADICAL PROSTATECTOMY  08/2009   [name] 08/14/2009 (08/14/2013)  . TOTAL HIP ARTHROPLASTY Left 11/14/2013   Procedure: LEFT TOTAL HIP ARTHROPLASTY ANTERIOR APPROACH;  Surgeon: Mcarthur Rossetti, MD;  Location: [place];  Service: Orthopedics;  Laterality: Left;  . TOTAL HIP ARTHROPLASTY Right 04/23/2017   Procedure: RIGHT TOTAL HIP ARTHROPLASTY ANTERIOR APPROACH;  Surgeon: Mcarthur Rossetti, MD;  [facility];  Service: Orthopedics;  Laterality: Right;    There were no vitals filed for this visit.  Subjective Assessment - 04/11/19 1347    Subjective  I overdid it exercising at home with the strap.  I could hardly move after.  Slept with the heating pad.  Better today.    Pertinent History  OA, obesity, hypercholesterolimia, prostate CA, rectal bleeding, white coat syndrome, R THR, L THR    Currently in Pain?  No/denies    Pain Score  0-No pain                       [facility] Adult PT Treatment/Exercise - 04/11/19 0001      Lumbar Exercises: Supine   Clam  20 reps    Clam Limitations  blue band single and double leg     Bridge  15 reps    Bridge Limitations  LEs on ball     Large Ball Abdominal Isometric Limitations  red physioball hand to thigh diagonal isometrics       Knee/Hip Exercises: Stretches   Active Hamstring Stretch  Right;Left;5 reps    Active Hamstring Stretch Limitations  2nd step with UE raise     Hip Flexor Stretch  Right;Left;5 reps    Hip Flexor Stretch Limitations  foot on second step, lunge style    Other Knee/Hip Stretches  DKTC with red ball 10x       Knee/Hip Exercises: Aerobic   Nustep  L2 8 min while discussing status LEs only 1/2 the time       Knee/Hip Exercises: Standing   Hip Abduction  Stengthening;Right;Left;2 sets;5 reps    Abduction Limitations  green band     Hip Extension  Stengthening;Right;Left;2 sets;5 reps  Extension Limitations  green band     Lateral Step Up  Right;Left;10 reps;Hand Hold: 1;Step Height: 6"      Knee/Hip Exercises: Seated   Sit to Sand  2 sets;10 reps;without UE support   holding 10# plate weight      Knee/Hip Exercises: Supine   Other Supine Knee/Hip Exercises  hand to knee push against ball 10x       Manual Therapy   Soft tissue mobilization  instrument assisted soft tissue to bil quads, ITB, gluteals, lumbar  min each                   PT Long Term Goals - 03/31/19 1220      PT LONG TERM GOAL #3   Title  Pt will be able to report pain </= 4/10 in bilateral hips while performing daily ADL's and household chores.    Status  On-going            Plan - 04/11/19 1735    Clinical Impression Statement  The patient is able to perform a progression of  lumbo/pelvic/hip core strengthening without complaint so spasms or production of pain.  Good response to soft tissue manual therapy prior to exercise.  Verbal cues to avoid substitution patterns and to encourage activation of gluteals in particular.  Therapist closely monitoring response with all interventions. He reports feeling better post treatment session.    Comorbidities  white coat symdrome, obesity, OA, hyperlipidemia, prostate CA, h/o R and L THR.    Rehab Potential  Good    PT Frequency  2x / week    PT Duration  6 weeks    PT Treatment/Interventions  Cryotherapy;Electrical Stimulation;Iontophoresis 4mg /ml Dexamethasone;Moist Heat;Gait training;Stair training;Functional mobility training;Therapeutic activities;Therapeutic exercise;Balance training;Neuromuscular re-education;Patient/family education;Manual techniques;Passive range of motion;Dry needling;Taping    PT Next Visit Plan  NuStep, bil hip P/A/ROM, Addaday soft tissue/myofascial;   LE strengthening progression bil;  LE flexibility    PT Home Exercise Plan  [id number]       Patient will benefit from skilled therapeutic intervention in order to improve the following deficits and impairments:  Obesity, Pain, Difficulty walking, Decreased strength, Decreased mobility, Postural dysfunction, Impaired flexibility  Visit Diagnosis: Pain in right hip  Pain in left hip  Difficulty in walking, not elsewhere classified  Muscle weakness (generalized)     Problem List Patient Active Problem List   Diagnosis Date Noted  . Chronic pain syndrome 03/28/2019  . Trochanteric bursitis of both hips 03/28/2019  . 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 Presence of right artificial hip joint 06/02/2017  . Unilateral primary osteoarthritis, right hip 04/23/2017  . Status post total replacement of right hip 04/23/2017  . Arthritis of left hip 11/14/2013  . Status post THR (total hip replacement) 11/14/2013  . Unspecified  gastritis and gastroduodenitis without mention of hemorrhage 08/15/2013  . Gastric polyp 08/15/2013   Ruben Im, PT 04/11/19 5:40 PM Phone: [phone] Fax: [phone] Alvera Singh 04/11/2019, 5:39 PM  [facility] [address], [place], [place], [address] Phone: [phone]   Fax:  [phone]  Name: Lance Rollins MRN: [id number] Date of Birth: 1946-01-06

## 2019-04-13 ENCOUNTER — Encounter: Payer: Self-pay | Admitting: Physical Therapy

## 2019-04-13 ENCOUNTER — Ambulatory Visit: Payer: Medicare Other | Admitting: Physical Therapy

## 2019-04-13 ENCOUNTER — Other Ambulatory Visit: Payer: Self-pay

## 2019-04-13 DIAGNOSIS — M25551 Pain in right hip: Secondary | ICD-10-CM

## 2019-04-13 DIAGNOSIS — M6281 Muscle weakness (generalized): Secondary | ICD-10-CM

## 2019-04-13 DIAGNOSIS — M25552 Pain in left hip: Secondary | ICD-10-CM

## 2019-04-13 DIAGNOSIS — R262 Difficulty in walking, not elsewhere classified: Secondary | ICD-10-CM

## 2019-04-13 NOTE — Therapy (Signed)
[facility] [address], [place], [place], [address] Phone: [phone]   Fax:  [phone]  Physical Therapy Treatment  Patient Details  Name: Lance Rollins MRN: [id number] Date of Birth: 16-Jul-1946 Referring Provider (PT): Lovorn, Megan,MD   Encounter Date: 04/13/2019  PT End of Session - 04/13/19 1225    Visit Number  5    Number of Visits  13    Date for PT Re-Evaluation  05/24/19    Authorization Type  Medicare: Kx modifier after visit 15    PT Start Time  1200    PT Stop Time  1240    PT Time Calculation (min)  40 min    Activity Tolerance  Patient tolerated treatment well       Past Medical History:  Diagnosis Date  . Arthritis   . Bleeding per rectum 08/2013   "black and bright red" (08/14/2013)  . Hypercholesteremia   . Obesity   . Prostate cancer ([place]) 2011  . Unilateral primary osteoarthritis, right hip 04/23/2017    Past Surgical History:  Procedure Laterality Date  . ESOPHAGOGASTRODUODENOSCOPY N/A 08/15/2013   Procedure: ESOPHAGOGASTRODUODENOSCOPY (EGD);  Surgeon: Milus Banister, MD;  Location: [place];  Service: Endoscopy;  Laterality: N/A;  . INGUINAL HERNIA REPAIR Bilateral ~ 2004  . ROBOT ASSISTED LAPAROSCOPIC RADICAL PROSTATECTOMY  08/2009   [name] 08/14/2009 (08/14/2013)  . TOTAL HIP ARTHROPLASTY Left 11/14/2013   Procedure: LEFT TOTAL HIP ARTHROPLASTY ANTERIOR APPROACH;  Surgeon: Mcarthur Rossetti, MD;  Location: [place];  Service: Orthopedics;  Laterality: Left;  . TOTAL HIP ARTHROPLASTY Right 04/23/2017   Procedure: RIGHT TOTAL HIP ARTHROPLASTY ANTERIOR APPROACH;  Surgeon: Mcarthur Rossetti, MD;  [facility]: [facility];  Service: Orthopedics;  Laterality: Right;    There were no vitals filed for this visit.  Subjective Assessment - 04/13/19 1159    Subjective  A little sore after last visit but my wife said no pain, no gain.  Hip and back doing a little better.  I want to get one fo  those (Addaday) massagers.  I did all my exercises at home this morning already.    Pertinent History  OA, obesity, hypercholesterolimia, prostate CA, rectal bleeding, white coat syndrome, R THR, L THR    Currently in Pain?  No/denies    Pain Score  0-No pain    Pain Location  Hip    Pain Orientation  Left                       [facility] Adult PT Treatment/Exercise - 04/13/19 0001      Knee/Hip Exercises: Stretches   Active Hamstring Stretch  Right;Left;5 reps    Active Hamstring Stretch Limitations  2nd step with UE raise     Hip Flexor Stretch  Right;Left;5 reps    Hip Flexor Stretch Limitations  foot on second step, lunge style      Knee/Hip Exercises: Aerobic   Nustep  L3 10 min while discussing status LEs only 1/2 the time       Knee/Hip Exercises: Machines for Strengthening   Total Gym Leg Press  90# 20x; 50# single leg 20x each   seat 9; reclined back      Knee/Hip Exercises: Standing   Other Standing Knee Exercises  floor sliders: abduction, extension and circles 10x each     Other Standing Knee Exercises  resisted walk 35# backwards and forwards.  5x each  Manual Therapy   Soft tissue mobilization  instrument assisted soft tissue to bil quads, ITB, gluteals, lumbar  min each                   PT Long Term Goals - 03/31/19 1220      PT LONG TERM GOAL #3   Title  Pt will be able to report pain </= 4/10 in bilateral hips while performing daily ADL's and household chores.    Status  On-going            Plan - 04/13/19 1225    Clinical Impression Statement  The patient reports a good response with soft tissue mobilization which improves his ability to perform a progression of hip strengthening.  Initiated leg press with a reclined back seat for comfort and resisted walking.  The patient hopes to return to the gym upon completion of PT.  Therapist closely monitoring response and providing cues for technique to further activate gluteals.     Comorbidities  white coat symdrome, obesity, OA, hyperlipidemia, prostate CA, h/o R and L THR.    Examination-Participation Restrictions  Church;Community Activity;Yard Work    Rehab Potential  Good    PT Frequency  2x / week    PT Duration  6 weeks    PT Treatment/Interventions  Cryotherapy;Electrical Stimulation;Iontophoresis 4mg /ml Dexamethasone;Moist Heat;Gait training;Stair training;Functional mobility training;Therapeutic activities;Therapeutic exercise;Balance training;Neuromuscular re-education;Patient/family education;Manual techniques;Passive range of motion;Dry needling;Taping    PT Next Visit Plan  NuStep, bil hip P/A/ROM, Addaday soft tissue/myofascial;   LE strengthening progression bil;  LE flexibility    PT Home Exercise Plan  [id number]       Patient will benefit from skilled therapeutic intervention in order to improve the following deficits and impairments:  Obesity, Pain, Difficulty walking, Decreased strength, Decreased mobility, Postural dysfunction, Impaired flexibility  Visit Diagnosis: Pain in right hip  Pain in left hip  Difficulty in walking, not elsewhere classified  Muscle weakness (generalized)     Problem List Patient Active Problem List   Diagnosis Date Noted  . Chronic pain syndrome 03/28/2019  . Trochanteric bursitis of both hips 03/28/2019  . 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 Presence of right artificial hip joint 06/02/2017  . Unilateral primary osteoarthritis, right hip 04/23/2017  . Status post total replacement of right hip 04/23/2017  . Arthritis of left hip 11/14/2013  . Status post THR (total hip replacement) 11/14/2013  . Unspecified gastritis and gastroduodenitis without mention of hemorrhage 08/15/2013  . Gastric polyp 08/15/2013   Ruben Im, PT 04/13/19 1:43 PM Phone: [phone] Fax: [phone] Alvera Singh 04/13/2019, 1:42 PM  [facility] [address], [place], [place], [address] Phone: [phone]   Fax:  [phone]  Name: Lance Rollins MRN: [id number] Date of Birth: Feb 07, 1946

## 2019-04-18 ENCOUNTER — Other Ambulatory Visit: Payer: Self-pay

## 2019-04-18 ENCOUNTER — Encounter: Payer: Self-pay | Admitting: Physical Therapy

## 2019-04-18 ENCOUNTER — Ambulatory Visit: Payer: Medicare Other | Admitting: Physical Therapy

## 2019-04-18 DIAGNOSIS — R262 Difficulty in walking, not elsewhere classified: Secondary | ICD-10-CM

## 2019-04-18 DIAGNOSIS — M25551 Pain in right hip: Secondary | ICD-10-CM

## 2019-04-18 DIAGNOSIS — M6281 Muscle weakness (generalized): Secondary | ICD-10-CM

## 2019-04-18 DIAGNOSIS — M25552 Pain in left hip: Secondary | ICD-10-CM

## 2019-04-18 NOTE — Patient Instructions (Signed)
     Trigger Point Dry Needling  . What is Trigger Point Dry Needling (DN)? o DN is a physical therapy technique used to treat muscle pain and dysfunction. Specifically, DN helps deactivate muscle trigger points (muscle knots).  o A thin filiform needle is used to penetrate the skin and stimulate the underlying trigger point. The goal is for a local twitch response (LTR) to occur and for the trigger point to relax. No medication of any kind is injected during the procedure.   . What Does Trigger Point Dry Needling Feel Like?  o The procedure feels different for each individual patient. Some patients report that they do not actually feel the needle enter the skin and overall the process is not painful. Very mild bleeding may occur. However, many patients feel a deep cramping in the muscle in which the needle was inserted. This is the local twitch response.   . How Will I feel after the treatment? o Soreness is normal, and the onset of soreness may not occur for a few hours. Typically this soreness does not last longer than two days.  o Bruising is uncommon, however; ice can be used to decrease any possible bruising.  o In rare cases feeling tired or nauseous after the treatment is normal. In addition, your symptoms may get worse before they get better, this period will typically not last longer than 24 hours.   . What Can I do After My Treatment? o Increase your hydration by drinking more water for the next 24 hours. o You may place ice or heat on the areas treated that have become sore, however, do not use heat on inflamed or bruised areas. Heat often brings more relief post needling. o You can continue your regular activities, but vigorous activity is not recommended initially after the treatment for 24 hours. o DN is best combined with other physical therapy such as strengthening, stretching, and other therapies.    [facility] [address], [address] [place], [place] [address] Phone # [phone] Fax [phone] 

## 2019-04-18 NOTE — Therapy (Signed)
[facility] [address], [place], [place], [address] Phone: [phone]   Fax:  [phone]  Physical Therapy Treatment  Patient Details  Name: Lance Rollins MRN: [id number] Date of Birth: 1945/08/12 Referring Provider (PT): Lovorn, Megan,MD   Encounter Date: 04/18/2019  PT End of Session - 04/18/19 1414    Visit Number  6    Number of Visits  13    Date for PT Re-Evaluation  05/24/19    Authorization Type  Medicare: Kx modifier after visit 15    PT Start Time  [id number]    PT Stop Time  1428    PT Time Calculation (min)  43 min    Activity Tolerance  Patient tolerated treatment well       Past Medical History:  Diagnosis Date  . Arthritis   . Bleeding per rectum 08/2013   "black and bright red" (08/14/2013)  . Hypercholesteremia   . Obesity   . Prostate cancer ([place]) 2011  . Unilateral primary osteoarthritis, right hip 04/23/2017    Past Surgical History:  Procedure Laterality Date  . ESOPHAGOGASTRODUODENOSCOPY N/A 08/15/2013   Procedure: ESOPHAGOGASTRODUODENOSCOPY (EGD);  Surgeon: Milus Banister, MD;  Location: [place];  Service: Endoscopy;  Laterality: N/A;  . INGUINAL HERNIA REPAIR Bilateral ~ 2004  . ROBOT ASSISTED LAPAROSCOPIC RADICAL PROSTATECTOMY  08/2009   [name] 08/14/2009 (08/14/2013)  . TOTAL HIP ARTHROPLASTY Left 11/14/2013   Procedure: LEFT TOTAL HIP ARTHROPLASTY ANTERIOR APPROACH;  Surgeon: Mcarthur Rossetti, MD;  Location: [facility];  Service: Orthopedics;  Laterality: Left;  . TOTAL HIP ARTHROPLASTY Right 04/23/2017   Procedure: RIGHT TOTAL HIP ARTHROPLASTY ANTERIOR APPROACH;  Surgeon: Mcarthur Rossetti, MD;  [facility];  Service: Orthopedics;  Laterality: Right;    There were no vitals filed for this visit.  Subjective Assessment - 04/18/19 1341    Subjective  Hips about the same.   Tightness left anterior thigh.  Feels better following PT treatment  but the tightness comes back  overnight.  I liked the leg press.    Pertinent History  OA, obesity, hypercholesterolimia, prostate CA, rectal bleeding, white coat syndrome, R THR, L THR    Currently in Pain?  No/denies    Pain Score  0-No pain    Pain Location  Hip    Pain Orientation  Left                       [facility] Adult PT Treatment/Exercise - 04/18/19 0001      Knee/Hip Exercises: Aerobic   Nustep  L3 10 min while discussing status LEs only 1/2 the time       Knee/Hip Exercises: Machines for Strengthening   Total Gym Leg Press  90# 20x; 50# single leg 20x each   seat 9; reclined back      Knee/Hip Exercises: Standing   Hip Extension  Stengthening;Right;Left;10 reps    Other Standing Knee Exercises  resisted walk 35# backwards and forwards.  5x each       Moist Heat Therapy   Number Minutes Moist Heat  5 Minutes    Moist Heat Location  Hip      Manual Therapy   Soft tissue mobilization  left quads, ITB    Passive ROM  over the edge of the bed knee flexion 15x        Trigger Point Dry Needling - 04/18/19 0001    Consent Given?  Yes  Education Handout Provided  Yes    Muscles Treated Lower Quadrant  Quadriceps;Vastus lateralis    Quadriceps Response  Palpable increased muscle length    Vastus lateralis Response  Palpable increased muscle length       Left only    PT Education - 04/18/19 1413    Education Details  dry needling after care    Person(s) Educated  Patient    Methods  Explanation;Handout    Comprehension  Verbalized understanding;Returned demonstration          PT Long Term Goals - 03/31/19 1220      PT LONG TERM GOAL #3   Title  Pt will be able to report pain </= 4/10 in bilateral hips while performing daily ADL's and household chores.    Status  On-going            Plan - 04/18/19 1426    Clinical Impression Statement  Patient reports he feels better post treatment session but relief lasts < 12 hours.  Initiated dry needling in conjuction with  manual therapy.  Good initial response with reports of improved mobility and decreased "tightness".  He is able to progress with hip and LE strengthening ex's without exacerbation of pain.  Therapist closely monitoring response to all treatment interventions.    Comorbidities  white coat symdrome, obesity, OA, hyperlipidemia, prostate CA, h/o R and L THR.    Examination-Participation Restrictions  Church;Community Activity;Yard Work    Rehab Potential  Good    PT Frequency  2x / week    PT Duration  6 weeks    PT Treatment/Interventions  Cryotherapy;Electrical Stimulation;Iontophoresis 4mg /ml Dexamethasone;Moist Heat;Gait training;Stair training;Functional mobility training;Therapeutic activities;Therapeutic exercise;Balance training;Neuromuscular re-education;Patient/family education;Manual techniques;Passive range of motion;Dry needling;Taping    PT Next Visit Plan  assess response to DN #1;  LE strengthening;  LE flexibility    PT Home Exercise Plan  [id number]       Patient will benefit from skilled therapeutic intervention in order to improve the following deficits and impairments:  Obesity, Pain, Difficulty walking, Decreased strength, Decreased mobility, Postural dysfunction, Impaired flexibility  Visit Diagnosis: Pain in right hip  Pain in left hip  Difficulty in walking, not elsewhere classified  Muscle weakness (generalized)     Problem List Patient Active Problem List   Diagnosis Date Noted  . Chronic pain syndrome 03/28/2019  . Trochanteric bursitis of both hips 03/28/2019  . 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 Presence of right artificial hip joint 06/02/2017  . Unilateral primary osteoarthritis, right hip 04/23/2017  . Status post total replacement of right hip 04/23/2017  . Arthritis of left hip 11/14/2013  . Status post THR (total hip replacement) 11/14/2013  . Unspecified gastritis and gastroduodenitis without mention of hemorrhage 08/15/2013   . Gastric polyp 08/15/2013   Ruben Im, PT 04/18/19 5:41 PM Phone: [phone] Fax: [phone] Alvera Singh 04/18/2019, 5:40 PM  [facility] [address], [place], [place], [address] Phone: [phone]   Fax:  [phone]  Name: Lance Rollins MRN: [id number] Date of Birth: 08-Apr-1946

## 2019-04-20 ENCOUNTER — Encounter: Payer: Self-pay | Admitting: Orthopaedic Surgery

## 2019-04-20 ENCOUNTER — Ambulatory Visit: Payer: Medicare Other | Admitting: Physical Therapy

## 2019-04-20 ENCOUNTER — Ambulatory Visit (INDEPENDENT_AMBULATORY_CARE_PROVIDER_SITE_OTHER): Payer: Medicare Other | Admitting: Orthopaedic Surgery

## 2019-04-20 ENCOUNTER — Other Ambulatory Visit: Payer: Self-pay

## 2019-04-20 ENCOUNTER — Ambulatory Visit: Payer: Self-pay

## 2019-04-20 DIAGNOSIS — Z96642 Presence of left artificial hip joint: Secondary | ICD-10-CM

## 2019-04-20 DIAGNOSIS — M25552 Pain in left hip: Secondary | ICD-10-CM

## 2019-04-20 DIAGNOSIS — R262 Difficulty in walking, not elsewhere classified: Secondary | ICD-10-CM

## 2019-04-20 DIAGNOSIS — M25551 Pain in right hip: Secondary | ICD-10-CM

## 2019-04-20 DIAGNOSIS — M6281 Muscle weakness (generalized): Secondary | ICD-10-CM

## 2019-04-20 MED ORDER — HYDROCODONE-ACETAMINOPHEN 5-325 MG PO TABS: 1.0000 | ORAL_TABLET | Freq: Two times a day (BID) | ORAL | 0 refills | Status: DC | PRN

## 2019-04-20 NOTE — Progress Notes (Signed)
The patient is now 5 years status post a left total hip arthroplasty.  We have also replaced his right hip.  The right hip is always been pain-free.  The left hip still hurts with taking steps and first thing in the morning.  We have obtained a three-phase bone scan earlier this year as well as an MRI of the left hip and it showed no evidence of loosening.  He is 73 years old.  He is obese and his blood pressures been up significantly.  He is working on that now with his primary care physician.  On exam I can move both hips through full range of motion.  The left hip seems to hurt him in the right hip does not.  New x-rays today are seen and compared to previous films.  There is slight lucent line just to the shoulder of the femoral prosthesis and slightly medial but all proximal.  The distal aspect is well ingrown and is nice to cortical bone.  I then reviewed the bone scan done earlier this year as well as the MRI and there was no fluid uptake around either component to suggest loosening.  This point I like see him back in 6 months for repeat low AP pelvis.  We may need to consider revision arthroplasty if he continues to be symptomatic from mechanical type of pain.  I would like to see his blood pressure under better control and to lose some weight.  I will provide some hydrocodone that he needs to use very sparingly.

## 2019-04-20 NOTE — Therapy (Signed)
[facility] [address], [place], [place], [address] Phone: [phone]   Fax:  [phone]  Physical Therapy Treatment  Patient Details  Name: Lance Rollins MRN: [id number] Date of Birth: July 25, 1946 Referring Provider (PT): Lovorn, Megan,MD   Encounter Date: 04/20/2019  PT End of Session - 04/20/19 1202    Visit Number  7    Number of Visits  13    Date for PT Re-Evaluation  05/24/19    Authorization Type  Medicare: Kx modifier after visit 15    PT Start Time  1125    PT Stop Time  1210    PT Time Calculation (min)  45 min    Activity Tolerance  Patient tolerated treatment well       Past Medical History:  Diagnosis Date  . Arthritis   . Bleeding per rectum 08/2013   "black and bright red" (08/14/2013)  . Hypercholesteremia   . Obesity   . Prostate cancer ([place]) 2011  . Unilateral primary osteoarthritis, right hip 04/23/2017    Past Surgical History:  Procedure Laterality Date  . ESOPHAGOGASTRODUODENOSCOPY N/A 08/15/2013   Procedure: ESOPHAGOGASTRODUODENOSCOPY (EGD);  Surgeon: Milus Banister, MD;  Location: [place];  Service: Endoscopy;  Laterality: N/A;  . INGUINAL HERNIA REPAIR Bilateral ~ 2004  . ROBOT ASSISTED LAPAROSCOPIC RADICAL PROSTATECTOMY  08/2009   [name] 08/14/2009 (08/14/2013)  . TOTAL HIP ARTHROPLASTY Left 11/14/2013   Procedure: LEFT TOTAL HIP ARTHROPLASTY ANTERIOR APPROACH;  Surgeon: Mcarthur Rossetti, MD;  Location: [facility];  Service: Orthopedics;  Laterality: Left;  . TOTAL HIP ARTHROPLASTY Right 04/23/2017   Procedure: RIGHT TOTAL HIP ARTHROPLASTY ANTERIOR APPROACH;  Surgeon: Mcarthur Rossetti, MD;  [facility];  Service: Orthopedics;  Laterality: Right;    There were no vitals filed for this visit.  Subjective Assessment - 04/20/19 1124    Subjective  That thigh hasn't hurt at all since we did the dry needling.  It really helped. I couldn't believe it didn't hurt when I woke up  this morning.   Sees the doctor this afternoon.    Currently in Pain?  No/denies    Pain Score  0-No pain    Pain Location  Hip    Pain Orientation  Left;Right    Aggravating Factors   too much activity    Pain Relieving Factors  stretching; dry needling         [facility] PT Assessment - 04/20/19 0001      Observation/Other Assessments   Focus on Therapeutic Outcomes (FOTO)   41% limitation       Strength   Right Hip Flexion  4+/5    Left Hip Flexion  4/5      Transfers   Five time sit to stand comments   16 sec using UEs                    [facility] Adult PT Treatment/Exercise - 04/20/19 0001      Knee/Hip Exercises: Stretches   Hip Flexor Stretch  Right;Left;5 reps    Hip Flexor Stretch Limitations  foot on second step, lunge style      Knee/Hip Exercises: Aerobic   Nustep  L3 10 min while discussing status LEs only 1/2 the time       Knee/Hip Exercises: Machines for Strengthening   Total Gym Leg Press  100# 15x; 55# single leg 15x each   seat 9; reclined back      Knee/Hip  Exercises: Standing   Hip Abduction  Stengthening;Right;Left;10 reps    Abduction Limitations  3#     Hip Extension  Stengthening;Right;Left;10 reps    Extension Limitations  3#     Other Standing Knee Exercises  resisted walk 35# backwards and forwards.  10x each       Manual Therapy   Soft tissue mobilization  left quads, ITB    Passive ROM  over the edge of the bed knee flexion 15x        Trigger Point Dry Needling - 04/20/19 0001    Consent Given?  Yes    Dry Needling Comments  right     Quadriceps Response  Twitch response elicited;Palpable increased muscle length    Vastus lateralis Response  Twitch response elicited;Palpable increased muscle length                PT Long Term Goals - 04/20/19 1214      PT LONG TERM GOAL #1   Title  Pt will be able to improve bilateral hip strength to grossly 5/5 in order to improve functional mobility.    Time  6    Status  New       PT LONG TERM GOAL #2   Title  Pt will be able to perform SLS on bilateral LE's 10 seconds safely in order to improve functional mobility.    Time  6    Period  Weeks    Status  On-going      PT LONG TERM GOAL #3   Title  Pt will be able to report pain </= 4/10 in bilateral hips while performing daily ADL's and household chores.    Time  6    Period  Weeks    Status  On-going      PT LONG TERM GOAL #4   Title  FOTO score reduced to 32% or less to show less limitation    Time  6    Period  Weeks    Status  On-going            Plan - 04/20/19 1140    Clinical Impression Statement  Not much improvement in FOTO but patient reports an overall 85% improvement.  He reports a signficant improvement following dry needling of left quads and requests the same to right today.  He is able to progress with resisted strengthening without an exacerbation of symptoms.  Therapist closely monitoring response with all interventions.  Patient will see his doctor today and will discuss with him regarding continuation of PT.    Comorbidities  white coat symdrome, obesity, OA, hyperlipidemia, prostate CA, h/o R and L THR.    Rehab Potential  Good    PT Frequency  2x / week    PT Duration  6 weeks    PT Treatment/Interventions  Cryotherapy;Electrical Stimulation;Iontophoresis 4mg /ml Dexamethasone;Moist Heat;Gait training;Stair training;Functional mobility training;Therapeutic activities;Therapeutic exercise;Balance training;Neuromuscular re-education;Patient/family education;Manual techniques;Passive range of motion;Dry needling;Taping    PT Next Visit Plan  assess response to DN #2 of right LE;  LE strengthening;  LE flexibility;  check SLS for LTGs    PT Home Exercise Plan  [id number]       Patient will benefit from skilled therapeutic intervention in order to improve the following deficits and impairments:  Obesity, Pain, Difficulty walking, Decreased strength, Decreased mobility, Postural  dysfunction, Impaired flexibility  Visit Diagnosis: Pain in right hip  Pain in left hip  Difficulty in walking, not elsewhere classified  Muscle  weakness (generalized)     Problem List Patient Active Problem List   Diagnosis Date Noted  . Chronic pain syndrome 03/28/2019  . Trochanteric bursitis of both hips 03/28/2019  . 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 Presence of right artificial hip joint 06/02/2017  . Unilateral primary osteoarthritis, right hip 04/23/2017  . Status post total replacement of right hip 04/23/2017  . Arthritis of left hip 11/14/2013  . Status post THR (total hip replacement) 11/14/2013  . Unspecified gastritis and gastroduodenitis without mention of hemorrhage 08/15/2013  . Gastric polyp 08/15/2013   Ruben Im, PT 04/20/19 12:18 PM Phone: [phone] Fax: [phone] Alvera Singh 04/20/2019, 12:17 PM  [facility] [address] [place], [place], [address] Phone: [phone]   Fax:  [phone]  Name: Dhilan Szalkowski MRN: [id number] Date of Birth: [birth year]

## 2019-04-25 ENCOUNTER — Encounter
Payer: Medicare Other | Attending: Physical Medicine and Rehabilitation | Admitting: Physical Medicine and Rehabilitation

## 2019-04-25 ENCOUNTER — Encounter: Payer: Self-pay | Admitting: Physical Medicine and Rehabilitation

## 2019-04-25 ENCOUNTER — Other Ambulatory Visit: Payer: Self-pay

## 2019-04-25 VITALS — BP 149/80 | HR 102 | Temp 98.0°F | Ht 67.5 in | Wt 258.0 lb

## 2019-04-25 DIAGNOSIS — G894 Chronic pain syndrome: Secondary | ICD-10-CM | POA: Diagnosis present

## 2019-04-25 DIAGNOSIS — M7061 Trochanteric bursitis, right hip: Secondary | ICD-10-CM | POA: Diagnosis not present

## 2019-04-25 DIAGNOSIS — M7062 Trochanteric bursitis, left hip: Secondary | ICD-10-CM | POA: Diagnosis not present

## 2019-04-25 DIAGNOSIS — Z96643 Presence of artificial hip joint, bilateral: Secondary | ICD-10-CM | POA: Diagnosis present

## 2019-04-25 DIAGNOSIS — Z5181 Encounter for therapeutic drug level monitoring: Secondary | ICD-10-CM | POA: Diagnosis not present

## 2019-04-25 MED ORDER — DICLOFENAC SODIUM 75 MG PO TBEC: 75.0000 mg | DELAYED_RELEASE_TABLET | Freq: Every day | ORAL | 3 refills | Status: DC | PRN

## 2019-04-25 NOTE — Progress Notes (Signed)
  Pt is here for L trochanteric bursa steroid injections.  Went over risks/benefits. Not allergic to iodine/shellfish.  Pt agreed- cleaned with betadine x3 on L trochanteric bursa and allowed to dry, then alcohol then injected 40 mg kenalog and 1cc of 1% Lidocaine with no EPI.  Was doing PT- 6 appointments- helped a little- maybe 1 point improvement on 1-10 scale.  Also Diclofenac was moderately helpful- needs more pills Takes flexeril with diclofenac- thinks its also helpful.   Plan: L>R trochanteric bursitis  Went over IT band exercises- using stationary bike- suggested turning feet out like a duck for every 5 minutes you ar eon bike normally, do 5 minutes "like a duck".  - continue the IT band exercises 4-5 days/week.  - will refill  Diclofenac can use 2x/week as needed for swelling/antiinflmmatory issue.  - If kenalog injection doesn't last long enough, will get preauthorization for Celestone injections.

## 2019-04-25 NOTE — Patient Instructions (Addendum)
Plan:  Went over IT band exercises- using stationary bike- suggested turning feet out like a duck for every 5 minutes you ar eon bike normally, do 5 minutes "like a duck".  - continue the IT band exercises 4-5 days/week.  - will refill  Diclofenac can use 2x/week as needed for swelling/antiinflmmatory issue.  - If kenalog injection doesn't last long enough, will get preauthorization for Celestone injections.   F/U 6 weeks

## 2019-05-24 ENCOUNTER — Telehealth: Payer: Self-pay | Admitting: Orthopaedic Surgery

## 2019-05-24 ENCOUNTER — Other Ambulatory Visit: Payer: Self-pay

## 2019-05-24 MED ORDER — NABUMETONE 750 MG PO TABS: 750.0000 mg | ORAL_TABLET | Freq: Two times a day (BID) | ORAL | 3 refills | Status: DC | PRN

## 2019-05-24 NOTE — Telephone Encounter (Signed)
Patient called needing Rx refilled ([place]) The number to contact IS [phone]

## 2019-05-24 NOTE — Telephone Encounter (Signed)
Sent in

## 2019-06-02 ENCOUNTER — Encounter: Payer: Medicare Other | Admitting: Physical Medicine and Rehabilitation

## 2019-06-19 ENCOUNTER — Other Ambulatory Visit: Payer: Self-pay

## 2019-06-19 ENCOUNTER — Encounter: Payer: Self-pay | Admitting: Physical Medicine and Rehabilitation

## 2019-06-19 ENCOUNTER — Encounter
Payer: Medicare Other | Attending: Physical Medicine and Rehabilitation | Admitting: Physical Medicine and Rehabilitation

## 2019-06-19 DIAGNOSIS — M7062 Trochanteric bursitis, left hip: Secondary | ICD-10-CM

## 2019-06-19 DIAGNOSIS — Z96643 Presence of artificial hip joint, bilateral: Secondary | ICD-10-CM | POA: Insufficient documentation

## 2019-06-19 DIAGNOSIS — M7061 Trochanteric bursitis, right hip: Secondary | ICD-10-CM

## 2019-06-19 DIAGNOSIS — G894 Chronic pain syndrome: Secondary | ICD-10-CM | POA: Diagnosis present

## 2019-06-19 DIAGNOSIS — Z5181 Encounter for therapeutic drug level monitoring: Secondary | ICD-10-CM | POA: Diagnosis present

## 2019-06-19 MED ORDER — CYCLOBENZAPRINE HCL 10 MG PO TABS: 10.0000 mg | ORAL_TABLET | Freq: Three times a day (TID) | ORAL | 11 refills | Status: DC | PRN

## 2019-06-19 MED ORDER — DICLOFENAC SODIUM 75 MG PO TBEC: 75.0000 mg | DELAYED_RELEASE_TABLET | Freq: Every day | ORAL | 11 refills | Status: DC | PRN

## 2019-06-19 NOTE — Progress Notes (Signed)
Subjective:    Patient ID: Lance Rollins, male    DOB: 1946-07-24, 73 y.o.   MRN: [id number] CC: B/L lateral hip pain HPI  Patient is a 73 yr old male with B/L trochanteric bursitis s/p injections 9/15- lasted until last week.  Worked real well- was down to 3-4/10 up until 11/2,/2020 which is a total of 6 weeks since injections.  Has been doing biking- 2x/day- 10-15 minutes at a time- duck biking as well- has been strengthening muscles  Went to PT- for HEP- also doing HEP 1x/day. Nowhere near how bad pain was prior to exercises/injections/HEP.         Pain Inventory Average Pain 8 Pain Right Now 9 My pain is aching  In the last 24 hours, has pain interfered with the following? General activity 9 Relation with others 9 Enjoyment of life 9 What TIME of day is your pain at its worst? morning Sleep (in general) Good  Pain is worse with: unsure Pain improves with: therapy/exercise and medication Relief from Meds: 7  Mobility use a cane ability to climb steps?  yes do you drive?  yes  Function retired  Neuro/Psych No problems in this area  Prior Studies Any changes since last visit?  no  Physicians involved in your care Any changes since last visit?  no   Family History  Problem Relation Age of Onset  . Diabetes Sister   . Diabetes Mother   . Breast cancer Mother   . Colon cancer Neg Hx    Social History   Socioeconomic History  . Marital status: Married    Spouse name: Not on file  . Number of children: 5  . Years of education: Not on file  . Highest education level: Not on file  Occupational History  . Occupation: retired  Scientific laboratory technician  . Financial resource strain: Not on file  . Food insecurity    Worry: Not on file    Inability: Not on file  . Transportation needs    Medical: Not on file    Non-medical: Not on file  Tobacco Use  . Smoking status: Never Smoker  . Smokeless tobacco: Never Used  Substance and Sexual Activity  . Alcohol  use: Yes    Comment: 08/14/2013 "might have a drink q other holiday"  . Drug use: No  . Sexual activity: Not Currently  Lifestyle  . Physical activity    Days per week: Not on file    Minutes per session: Not on file  . Stress: Not on file  Relationships  . Social Herbalist on phone: Not on file    Gets together: Not on file    Attends religious service: Not on file    Active member of club or organization: Not on file    Attends meetings of clubs or organizations: Not on file    Relationship status: Not on file  Other Topics Concern  . Not on file  Social History Narrative  . Not on file   Past Surgical History:  Procedure Laterality Date  . ESOPHAGOGASTRODUODENOSCOPY N/A 08/15/2013   Procedure: ESOPHAGOGASTRODUODENOSCOPY (EGD);  Surgeon: Milus Banister, MD;  Location: [facility];  Service: Endoscopy;  Laterality: N/A;  . INGUINAL HERNIA REPAIR Bilateral ~ 2004  . ROBOT ASSISTED LAPAROSCOPIC RADICAL PROSTATECTOMY  08/2009   [name] 08/14/2009 (08/14/2013)  . TOTAL HIP ARTHROPLASTY Left 11/14/2013   Procedure: LEFT TOTAL HIP ARTHROPLASTY ANTERIOR APPROACH;  Surgeon: Mcarthur Rossetti, MD;  Location:  [facility];  Service: Orthopedics;  Laterality: Left;  . TOTAL HIP ARTHROPLASTY Right 04/23/2017   Procedure: RIGHT TOTAL HIP ARTHROPLASTY ANTERIOR APPROACH;  Surgeon: Mcarthur Rossetti, MD;  Location: [facility];  Service: Orthopedics;  Laterality: Right;   Past Medical History:  Diagnosis Date  . Arthritis   . Bleeding per rectum 08/2013   "black and bright red" (08/14/2013)  . Hypercholesteremia   . Obesity   . Prostate cancer ([place]) 2011  . Unilateral primary osteoarthritis, right hip 04/23/2017   BP 132/81   Pulse 85   Temp (!) 96.6 F (35.9 C)   Ht 5\' 7"  (1.702 m)   Wt 253 lb (114.8 kg)   SpO2 96%   BMI 39.63 kg/m   Opioid Risk Score:   Fall Risk Score:  `1  Depression screen PHQ 2/9  Depression screen PHQ 2/9 06/19/2019  Decreased Interest 0  Down,  Depressed, Hopeless 0  PHQ - 2 Score 0    Review of Systems  All other systems reviewed and are negative.      Objective:   Physical Exam Awake, alert, appropriate, sitting in chair, appears more comfortable, NAD Still TTP over Trochanteric bursitis L>R Still tight IT bands B/L      Assessment & Plan:   Patient is a 73 yr old male with B/L trochanteric bursitis s/p injections  1. Will get approval from insurance- for Celestone long acting steroid since his only lasted 6 weeks out of a total of 3 months.  2. Will refill Diclofenac and Flexeril  For patient with 11 RFs; no more than 1x/day Diclofenac.  3. F/U in 5 weeks for celestone injections.

## 2019-06-19 NOTE — Patient Instructions (Signed)
  Patient is a 73 yr old male with B/L trochanteric bursitis s/p injections  1. Will get approval from insurance- for Celestone long acting steroid since his only lasted 6 weeks out of a total of 3 months.  2. Will refill Diclofenac and Flexeril  For patient with 11 RFs; no more than 1x/day Diclofenac.  3. F/U in 5 weeks for celestone injections.

## 2019-07-24 ENCOUNTER — Encounter: Payer: Self-pay | Admitting: Physical Medicine and Rehabilitation

## 2019-07-24 ENCOUNTER — Other Ambulatory Visit: Payer: Self-pay

## 2019-07-24 ENCOUNTER — Encounter
Payer: Medicare Other | Attending: Physical Medicine and Rehabilitation | Admitting: Physical Medicine and Rehabilitation

## 2019-07-24 VITALS — BP 134/81 | HR 96 | Temp 97.7°F | Ht 67.5 in | Wt 263.0 lb

## 2019-07-24 DIAGNOSIS — Z96643 Presence of artificial hip joint, bilateral: Secondary | ICD-10-CM | POA: Insufficient documentation

## 2019-07-24 DIAGNOSIS — M7062 Trochanteric bursitis, left hip: Secondary | ICD-10-CM | POA: Diagnosis present

## 2019-07-24 DIAGNOSIS — Z5181 Encounter for therapeutic drug level monitoring: Secondary | ICD-10-CM | POA: Diagnosis present

## 2019-07-24 DIAGNOSIS — G894 Chronic pain syndrome: Secondary | ICD-10-CM | POA: Insufficient documentation

## 2019-07-24 DIAGNOSIS — M7061 Trochanteric bursitis, right hip: Secondary | ICD-10-CM | POA: Insufficient documentation

## 2019-07-24 NOTE — Progress Notes (Signed)
   Today, doing celestone injections of L trochanteric bursitis.  steroid injection was performed at L trochanteric bursa injection using 1% plain Lidocaine and 40mg  /1cc of Celestone. This was well tolerated.  Cleaned with betadine x3 and allowed to dry- then alcohol then injected using 27 gauge 1.5 inch needle- no bleeding or complications.    F/U in 3 months for steroid injections of L trochanteric bursa steroid injection Lidocaine will kick in 15 minutes- and wear off tonight- the steroid will kick in tomorrow within 24 hours and take up to 72 hours to fully kick in.

## 2019-07-24 NOTE — Patient Instructions (Signed)
Today, doing celestone injections of L trochanteric bursitis.  steroid injection was performed at L trochanteric bursa injection using 1% plain Lidocaine and 40mg  /1cc of Celestone. This was well tolerated.  Cleaned with betadine x3 and allowed to dry- then alcohol then injected using 27 gauge 1.5 inch needle- no bleeding or complications.    F/U in 3 months for steroid injections of L trochanteric bursa steroid injection Lidocaine will kick in 15 minutes- and wear off tonight- the steroid will kick in tomorrow within 24 hours and take up to 72 hours to fully kick in.

## 2019-09-22 ENCOUNTER — Ambulatory Visit: Payer: Medicare Other | Attending: Internal Medicine

## 2019-09-22 DIAGNOSIS — Z23 Encounter for immunization: Secondary | ICD-10-CM | POA: Insufficient documentation

## 2019-09-22 NOTE — Progress Notes (Signed)
   Covid-19 Vaccination Clinic  Name:  Lance Rollins    MRN: [id number] DOB: 01/10/1946  09/22/2019  [name] was observed post Covid-19 immunization for 15 minutes without incidence. He was provided with Vaccine Information Sheet and instruction to access the V-Safe system.   [name] was instructed to call 911 with any severe reactions post vaccine: Marland Kitchen Difficulty breathing  . Swelling of your face and throat  . A fast heartbeat  . A bad rash all over your body  . Dizziness and weakness    Immunizations Administered    Name Date Dose VIS Date Route   Pfizer COVID-19 Vaccine 09/22/2019  1:59 PM 0.3 mL 07/21/2019 Intramuscular   Manufacturer: [place]   Lot: [id number]   [place]: [id number]

## 2019-10-15 ENCOUNTER — Ambulatory Visit: Payer: Medicare Other | Attending: Internal Medicine

## 2019-10-15 DIAGNOSIS — Z23 Encounter for immunization: Secondary | ICD-10-CM | POA: Insufficient documentation

## 2019-10-15 NOTE — Progress Notes (Signed)
   Covid-19 Vaccination Clinic  Name:  [name]    MRN: [id number] DOB: Dec 05, 1945  10/15/2019  Lance Rollins was observed post Covid-19 immunization for 15 minutes without incident. He was provided with Vaccine Information Sheet and instruction to access the V-Safe system.   Lance Rollins was instructed to call 911 with any severe reactions post vaccine: Marland Kitchen Difficulty breathing  . Swelling of face and throat  . A fast heartbeat  . A bad rash all over body  . Dizziness and weakness   Immunizations Administered    Name Date Dose VIS Date Route   Pfizer COVID-19 Vaccine 10/15/2019 12:26 PM 0.3 mL 07/21/2019 Intramuscular   Manufacturer: [place]   Lot: [id number]   [place]: [id number]

## 2019-10-18 ENCOUNTER — Ambulatory Visit: Payer: Medicare Other | Admitting: Orthopaedic Surgery

## 2019-10-18 ENCOUNTER — Ambulatory Visit: Payer: Medicare Other | Admitting: Physician Assistant

## 2019-10-18 ENCOUNTER — Ambulatory Visit: Payer: Self-pay

## 2019-10-18 ENCOUNTER — Encounter: Payer: Self-pay | Admitting: Physician Assistant

## 2019-10-18 ENCOUNTER — Other Ambulatory Visit: Payer: Self-pay

## 2019-10-18 DIAGNOSIS — Z96642 Presence of left artificial hip joint: Secondary | ICD-10-CM

## 2019-10-18 DIAGNOSIS — Z96641 Presence of right artificial hip joint: Secondary | ICD-10-CM

## 2019-10-18 MED ORDER — HYDROCODONE-ACETAMINOPHEN 5-325 MG PO TABS: 1.0000 | ORAL_TABLET | Freq: Four times a day (QID) | ORAL | 0 refills | Status: DC | PRN

## 2019-10-18 NOTE — Progress Notes (Signed)
Office Visit Note   Patient: Lance Rollins           Date of Birth: 08-Jul-1946           MRN: [id number] Visit Date: 10/18/2019              Requested by: Lawerance Cruel, [place],  [place] [address] PCP: Lawerance Cruel, MD   Assessment & Plan: Visit Diagnoses:  1. Presence of right artificial hip joint   2. Presence of left artificial hip joint     Plan: We will have him follow-up with [place] in 6 months AP pelvis at that time.  He is given Norco which he is to use sparingly.  Follow-up sooner if his pain becomes worse or changes.  Questions were encouraged and answered.  Of note patient does not want any type of surgical intervention on the left hip.  Follow-Up Instructions: Return in about 6 months (around 04/19/2020) for Radiographs.   Orders:  Orders Placed This Encounter  Procedures  . XR Pelvis 1-2 Views   No orders of the defined types were placed in this encounter.     Procedures: No procedures performed   Clinical Data: No additional findings.   Subjective: Chief Complaint  Patient presents with  . Left Hip - Pain    HPI Lance Rollins comes in today follow-up of his left hip pain.  States he still has pain in the left hip particularly in the morning and evenings.  He has had no new injury.  Denies any radicular symptoms down either leg.  Pains anterior lateral aspect of the left hip.  He states his pain can be 8.5-9 out of 10 pain in the morning or evening.  Has been going to him last few weeks and feels that this is helping some.  Otherwise he states that his hip pain is unchanged.  Right hip he has no pain.  Review of Systems Negative for fevers, chills, shortness of breath or chest pain  Objective: Vital Signs: There were no vitals taken for this visit.  Physical Exam Constitutional:      Appearance: He is not ill-appearing or diaphoretic.  Pulmonary:     Effort: Pulmonary effort is normal.  Neurological:     Mental Status: He  is alert and oriented to person, place, and time.  Psychiatric:        Mood and Affect: Mood normal.        Behavior: Behavior normal.     Ortho Exam Left hip excellent range of motion without pain.  Ambulates without any assistive device or antalgic gait. Specialty Comments:  No specialty comments available.  Imaging: XR Pelvis 1-2 Views  Result Date: 10/18/2019 AP pelvis: Right total hip arthroplasties well-seated.  No acute fractures or bony abilities.  Slight lucency around the left hip femoral component is unchanged from prior films.  No evidence of loosening around the acetabular component.  Both hips are well located.    PMFS History: Patient Active Problem List   Diagnosis Date Noted  . Chronic pain syndrome 03/28/2019  . Trochanteric bursitis of both hips 03/28/2019  . 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 Presence of right artificial hip joint 06/02/2017  . Unilateral primary osteoarthritis, right hip 04/23/2017  . Status post total replacement of right hip 04/23/2017  . Arthritis of left hip 11/14/2013  . Status post THR (total hip replacement) 11/14/2013  . Unspecified gastritis and gastroduodenitis without  mention of hemorrhage 08/15/2013  . Gastric polyp 08/15/2013   Past Medical History:  Diagnosis Date  . Arthritis   . Bleeding per rectum 08/2013   "black and bright red" (08/14/2013)  . Hypercholesteremia   . Obesity   . Prostate cancer ([facility]) 2011  . Unilateral primary osteoarthritis, right hip 04/23/2017    Family History  Problem Relation Age of Onset  . Diabetes Sister   . Diabetes Mother   . Breast cancer Mother   . Colon cancer Neg Hx     Past Surgical History:  Procedure Laterality Date  . ESOPHAGOGASTRODUODENOSCOPY N/A 08/15/2013   Procedure: ESOPHAGOGASTRODUODENOSCOPY (EGD);  Surgeon: Milus Banister, MD;  Location: [facility];  Service: Endoscopy;  Laterality: N/A;  . INGUINAL HERNIA REPAIR Bilateral ~ 2004  . ROBOT  ASSISTED LAPAROSCOPIC RADICAL PROSTATECTOMY  08/2009   [name] 08/14/2009 (08/14/2013)  . TOTAL HIP ARTHROPLASTY Left 11/14/2013   Procedure: LEFT TOTAL HIP ARTHROPLASTY ANTERIOR APPROACH;  Surgeon: Mcarthur Rossetti, MD;  Location: [facility];  Service: Orthopedics;  Laterality: Left;  . TOTAL HIP ARTHROPLASTY Right 04/23/2017   Procedure: RIGHT TOTAL HIP ARTHROPLASTY ANTERIOR APPROACH;  Surgeon: Mcarthur Rossetti, MD;  Location[facility];  Service: Orthopedics;  Laterality: Right;   Social History   Occupational History  . Occupation: retired  Tobacco Use  . Smoking status: Never Smoker  . Smokeless tobacco: Never Used  Substance and Sexual Activity  . Alcohol use: Yes    Comment: 08/14/2013 "might have a drink q other holiday"  . Drug use: No  . Sexual activity: Not Currently

## 2019-10-25 ENCOUNTER — Other Ambulatory Visit: Payer: Self-pay

## 2019-10-25 ENCOUNTER — Encounter: Payer: Self-pay | Admitting: Physical Medicine and Rehabilitation

## 2019-10-25 ENCOUNTER — Encounter
Payer: Medicare Other | Attending: Physical Medicine and Rehabilitation | Admitting: Physical Medicine and Rehabilitation

## 2019-10-25 VITALS — BP 153/93 | HR 87 | Temp 97.5°F | Ht 67.5 in | Wt 261.0 lb

## 2019-10-25 DIAGNOSIS — Z5181 Encounter for therapeutic drug level monitoring: Secondary | ICD-10-CM | POA: Diagnosis present

## 2019-10-25 DIAGNOSIS — Z96643 Presence of artificial hip joint, bilateral: Secondary | ICD-10-CM | POA: Insufficient documentation

## 2019-10-25 DIAGNOSIS — Z96642 Presence of left artificial hip joint: Secondary | ICD-10-CM | POA: Diagnosis not present

## 2019-10-25 DIAGNOSIS — M7062 Trochanteric bursitis, left hip: Secondary | ICD-10-CM | POA: Diagnosis not present

## 2019-10-25 DIAGNOSIS — M7061 Trochanteric bursitis, right hip: Secondary | ICD-10-CM

## 2019-10-25 DIAGNOSIS — G894 Chronic pain syndrome: Secondary | ICD-10-CM | POA: Diagnosis not present

## 2019-10-25 MED ORDER — DICLOFENAC SODIUM 75 MG PO TBEC: 75.0000 mg | DELAYED_RELEASE_TABLET | Freq: Every day | ORAL | 11 refills | Status: DC | PRN

## 2019-10-25 MED ORDER — CYCLOBENZAPRINE HCL 10 MG PO TABS: 10.0000 mg | ORAL_TABLET | Freq: Three times a day (TID) | ORAL | 11 refills | Status: DC | PRN

## 2019-10-25 NOTE — Progress Notes (Signed)
Subjective:    Patient ID: Lance Rollins, male    DOB: July 08, 1946, 74 y.o.   MRN: [id number]  HPI  Patient is a 74 yr old male with B/L trochanteric bursitis s/p injections  Went back to gym 3 weeks and went back to Dr Ninfa Linden last Friday- doesn't need to see him for 6 weeks.   Treadmill and the bike- stationary bike- still does duck feet can tell a difference when does this.    Going well- wants more meds/refills   Pelvic Xray 3/21 AP pelvis: Right total hip arthroplasties well-seated. No acute fractures  or bony abilities. Slight lucency around the left hip femoral component  is unchanged from prior films. No evidence of loosening around the  acetabular component. Both hips are well located.  Not quite as effective- bothers him the most early AM.   Tight and sore on top of thighs-   Takes Diclofenac and Flexeril 1x/day usually- and Nambutone from Dr Ninfa Linden 1x/day  Got his COVID vaccine x2- last one was last Sunday.  And wife already got hers; and daughter is due to 2nd one this week.     Pain Inventory Average Pain 9 Pain Right Now 9 My pain is intermittent and aching  In the last 24 hours, has pain interfered with the following? General activity 8 Relation with others 0 Enjoyment of life 5 What TIME of day is your pain at its worst? morning Sleep (in general) Fair  Pain is worse with: standing Pain improves with: heat/ice, therapy/exercise, medication and injections Relief from Meds: 8  Mobility walk without assistance  Function retired  Neuro/Psych trouble walking  Prior Studies Any changes since last visit?  no  Physicians involved in your care Any changes since last visit?  no   Family History  Problem Relation Age of Onset  . Diabetes Sister   . Diabetes Mother   . Breast cancer Mother   . Colon cancer Neg Hx    Social History   Socioeconomic History  . Marital status: Married    Spouse name: Not on file  . Number of  children: 5  . Years of education: Not on file  . Highest education level: Not on file  Occupational History  . Occupation: retired  Tobacco Use  . Smoking status: Never Smoker  . Smokeless tobacco: Never Used  Substance and Sexual Activity  . Alcohol use: Yes    Comment: 08/14/2013 "might have a drink q other holiday"  . Drug use: No  . Sexual activity: Not Currently  Other Topics Concern  . Not on file  Social History Narrative  . Not on file   Social Determinants of Health   Financial Resource Strain:   . Difficulty of Paying Living Expenses:   Food Insecurity:   . Worried About Charity fundraiser in the Last Year:   . Arboriculturist in the Last Year:   Transportation Needs:   . Film/video editor (Medical):   Marland Kitchen Lack of Transportation (Non-Medical):   Physical Activity:   . Days of Exercise per Week:   . Minutes of Exercise per Session:   Stress:   . Feeling of Stress :   Social Connections:   . Frequency of Communication with Friends and Family:   . Frequency of Social Gatherings with Friends and Family:   . Attends Religious Services:   . Active Member of Clubs or Organizations:   . Attends Archivist Meetings:   .  Marital Status:    Past Surgical History:  Procedure Laterality Date  . ESOPHAGOGASTRODUODENOSCOPY N/A 08/15/2013   Procedure: ESOPHAGOGASTRODUODENOSCOPY (EGD);  Surgeon: Milus Banister, MD;  Location: [facility];  Service: Endoscopy;  Laterality: N/A;  . INGUINAL HERNIA REPAIR Bilateral ~ 2004  . ROBOT ASSISTED LAPAROSCOPIC RADICAL PROSTATECTOMY  08/2009   [name] 08/14/2009 (08/14/2013)  . TOTAL HIP ARTHROPLASTY Left 11/14/2013   Procedure: LEFT TOTAL HIP ARTHROPLASTY ANTERIOR APPROACH;  Surgeon: Mcarthur Rossetti, MD;  Location: [facility];  Service: Orthopedics;  Laterality: Left;  . TOTAL HIP ARTHROPLASTY Right 04/23/2017   Procedure: RIGHT TOTAL HIP ARTHROPLASTY ANTERIOR APPROACH;  Surgeon: Mcarthur Rossetti, MD;  [facility];   Service: Orthopedics;  Laterality: Right;   Past Medical History:  Diagnosis Date  . Arthritis   . Bleeding per rectum 08/2013   "black and bright red" (08/14/2013)  . Hypercholesteremia   . Obesity   . Prostate cancer ([facility]) 2011  . Unilateral primary osteoarthritis, right hip 04/23/2017   Temp (!) 97.5 F (36.4 C)   Ht 5' 7.5" (1.715 m)   Wt 261 lb (118.4 kg)   BMI 40.28 kg/m   Opioid Risk Score:   Fall Risk Score:  `1  Depression screen PHQ 2/9  Depression screen PHQ 2/9 06/19/2019  Decreased Interest 0  Down, Depressed, Hopeless 0  PHQ - 2 Score 0    Review of Systems  HENT: Negative.   Eyes: Negative.   Respiratory: Negative.   Cardiovascular: Negative.   Gastrointestinal: Negative.   Endocrine: Negative.   Genitourinary: Negative.   Musculoskeletal: Positive for arthralgias and gait problem.  Hematological: Negative.   Psychiatric/Behavioral: Negative.   All other systems reviewed and are negative.      Objective:   Physical Exam  Awake, alert, appropriate, alone, NAD Not as TTP over L trochanteric bursa  Tight musculature over thighs B/L Also tight on anterior calf B/L      Assessment & Plan:    1. Refill Diclofenac usually takes 1x/day- can prescribe 2x/day as needed- 1x/day.   2. Refill Flexeril - taking 1-2/xday- will refill for that.   3. Will be happy to redo Celstone injections if needed, however doesn't need right now- will scheduled as needed.   4.  Use rolling pin- run rolling pin towards heart- and angle for muscle groups on sides- medial and lateral - can also do on the side- to relax the muscle on leg. Will reduce the need for trochanteric bursa injection.  Also have wife use on calves- the front- both sides.   5. Showed pt the muscle groups on diagram.   6. F/U 6 months.

## 2019-10-25 NOTE — Patient Instructions (Signed)
  1. Refill Diclofenac usually takes 1x/day- can prescribe 2x/day as needed- 1x/day.   2. Refill Flexeril - taking 1-2/xday- will refill for that.   3. Will be happy to redo Celstone injections if needed, however doesn't need right now- will scheduled as needed.   4.  Use rolling pin- run rolling pin towards heart- and angle for muscle groups on sides- medial and lateral - can also do on the side- to relax the muscle on leg. Will reduce the need for trochanteric bursa injection.  Also have wife use on calves- the front- both sides.   5. Showed pt the muscle groups on diagram.   6. F/U 6 months. If want injection prior to that, call and say needs celestone injection.

## 2019-11-03 ENCOUNTER — Other Ambulatory Visit: Payer: Self-pay | Admitting: Orthopaedic Surgery

## 2019-11-08 ENCOUNTER — Encounter: Payer: Self-pay | Admitting: Physician Assistant

## 2019-11-23 ENCOUNTER — Encounter: Payer: Self-pay | Admitting: Physician Assistant

## 2019-11-23 ENCOUNTER — Ambulatory Visit: Payer: Medicare Other | Admitting: Physician Assistant

## 2019-11-23 VITALS — BP 110/68 | HR 83 | Temp 98.3°F | Ht 67.5 in | Wt 253.0 lb

## 2019-11-23 DIAGNOSIS — Z86010 Personal history of colonic polyps: Secondary | ICD-10-CM

## 2019-11-23 DIAGNOSIS — K602 Anal fissure, unspecified: Secondary | ICD-10-CM

## 2019-11-23 DIAGNOSIS — Z860101 Personal history of adenomatous and serrated colon polyps: Secondary | ICD-10-CM

## 2019-11-23 DIAGNOSIS — K625 Hemorrhage of anus and rectum: Secondary | ICD-10-CM | POA: Diagnosis not present

## 2019-11-23 MED ORDER — AMBULATORY NON FORMULARY MEDICATION: 1.0000 | Freq: Three times a day (TID) | 1 refills | Status: DC

## 2019-11-23 NOTE — Patient Instructions (Signed)
We have sent the following medications to your pharmacy for you to pick up at your convenience:  [facility]'s information is below: Address: [address], [place], [place] [address]  [phone]  *Please DO NOT go directly from our office to pick up this medication! Give the pharmacy 1 day to process the prescription as this is compounded and takes time to make.  You may try sitz baths - 15-20 minutes two to three times a day  Please follow up with Anderson Malta on ___________________

## 2019-11-23 NOTE — Progress Notes (Signed)
Chief Complaint: Bright red blood per rectum  HPI:    Lance Rollins is a 74 year old male with a past medical history as listed below, known to Dr. Ardis Hughs, who was referred to me by Lawerance Cruel, MD for a complaint of bright red blood per rectum.      10/27/2016 colonoscopy done for personal history of adenomatous polyps in 2015.  Findings at that time of one 4 mm polyp in the proximal transverse colon, diverticulosis in the entire colon and otherwise normal.  Pathology showed tubular adenoma.  Repeat was recommended in 5 years.    Today, the patient tells me that ever since the beginning of February about every other time he passes a stool he sees bright red blood on top of it.  Tells me this happens regardless of if he has a soft stool or hard stool.  Denies any rectal pain, abdominal pain or change in bowel habits.    Denies fever, chills or weight loss.     Past Medical History:  Diagnosis Date  . Arthritis   . Bleeding per rectum 08/2013   "black and bright red" (08/14/2013)  . Hypercholesteremia   . Obesity   . Prostate cancer ([place]) 2011  . Unilateral primary osteoarthritis, right hip 04/23/2017    Past Surgical History:  Procedure Laterality Date  . ESOPHAGOGASTRODUODENOSCOPY N/A 08/15/2013   Procedure: ESOPHAGOGASTRODUODENOSCOPY (EGD);  Surgeon: Milus Banister, MD;  Location: [facility];  Service: Endoscopy;  Laterality: N/A;  . INGUINAL HERNIA REPAIR Bilateral ~ 2004  . ROBOT ASSISTED LAPAROSCOPIC RADICAL PROSTATECTOMY  08/2009   [name] 08/14/2009 (08/14/2013)  . TOTAL HIP ARTHROPLASTY Left 11/14/2013   Procedure: LEFT TOTAL HIP ARTHROPLASTY ANTERIOR APPROACH;  Surgeon: Mcarthur Rossetti, MD;  Location: [place];  Service: Orthopedics;  Laterality: Left;  . TOTAL HIP ARTHROPLASTY Right 04/23/2017   Procedure: RIGHT TOTAL HIP ARTHROPLASTY ANTERIOR APPROACH;  Surgeon: Mcarthur Rossetti, MD;  Location: [facility];  Service: Orthopedics;  Laterality: Right;    Current Outpatient  Medications  Medication Sig Dispense Refill  . aspirin 325 MG EC tablet Take 1 tablet (325 mg total) by mouth 2 (two) times daily after a meal. 40 tablet 0  . cyclobenzaprine (FLEXERIL) 10 MG tablet Take 1 tablet (10 mg total) by mouth 3 (three) times daily as needed for muscle spasms (hip tightness). 60 tablet 11  . diclofenac (VOLTAREN) 75 MG EC tablet Take 1 tablet (75 mg total) by mouth daily as needed. 30 tablet 11  . HYDROcodone-acetaminophen (NORCO) 5-325 MG tablet Take 1-2 tablets by mouth every 6 (six) hours as needed for moderate pain. (Patient not taking: Reported on 10/25/2019) 30 tablet 0  . Multiple Vitamin (MULTIVITAMIN WITH MINERALS) TABS tablet Take 1 tablet by mouth daily.    . nabumetone (RELAFEN) 750 MG tablet Take 1 tablet by mouth twice daily as needed 60 tablet 0  . tiZANidine (ZANAFLEX) 4 MG tablet Take 1 tablet (4 mg total) by mouth every 8 (eight) hours as needed for muscle spasms. 60 tablet 0   Current Facility-Administered Medications  Medication Dose Route Frequency Provider Last Rate Last Admin  . 0.9 %  sodium chloride infusion  500 mL Intravenous Continuous Milus Banister, MD        Allergies as of 11/23/2019  . (No Known Allergies)    Family History  Problem Relation Age of Onset  . Diabetes Sister   . Diabetes Mother   . Breast cancer Mother   . Colon  cancer Neg Hx     Social History   Socioeconomic History  . Marital status: Married    Spouse name: Not on file  . Number of children: 5  . Years of education: Not on file  . Highest education level: Not on file  Occupational History  . Occupation: retired  Tobacco Use  . Smoking status: Never Smoker  . Smokeless tobacco: Never Used  Substance and Sexual Activity  . Alcohol use: Yes    Comment: 08/14/2013 "might have a drink q other holiday"  . Drug use: No  . Sexual activity: Not Currently  Other Topics Concern  . Not on file  Social History Narrative  . Not on file   Social  Determinants of Health   Financial Resource Strain:   . Difficulty of Paying Living Expenses:   Food Insecurity:   . Worried About Charity fundraiser in the Last Year:   . Arboriculturist in the Last Year:   Transportation Needs:   . Film/video editor (Medical):   Marland Kitchen Lack of Transportation (Non-Medical):   Physical Activity:   . Days of Exercise per Week:   . Minutes of Exercise per Session:   Stress:   . Feeling of Stress :   Social Connections:   . Frequency of Communication with Friends and Family:   . Frequency of Social Gatherings with Friends and Family:   . Attends Religious Services:   . Active Member of Clubs or Organizations:   . Attends Archivist Meetings:   Marland Kitchen Marital Status:   Intimate Partner Violence:   . Fear of Current or Ex-Partner:   . Emotionally Abused:   Marland Kitchen Physically Abused:   . Sexually Abused:     Review of Systems:    Constitutional: No weight loss, fever or chills Skin: No rash  Cardiovascular: No chest pain Respiratory: No SOB  Gastrointestinal: See HPI and otherwise negative Genitourinary: No dysuria  Neurological: No headache, dizziness or syncope Musculoskeletal: No new muscle or joint pain Hematologic: No bruising Psychiatric: No history of depression or anxiety    Physical Exam:  Vital signs: BP 110/68   Pulse 83   Temp 98.3 F (36.8 C)   Ht 5' 7.5" (1.715 m)   Wt 253 lb (114.8 kg)   BMI 39.04 kg/m   Constitutional:   Pleasant AA male appears to be in NAD, Well developed, Well nourished, alert and cooperative Head:  Normocephalic and atraumatic. Eyes:   PEERL, EOMI. No icterus. Conjunctiva pink. Ears:  Normal auditory acuity. Neck:  Supple Throat: Oral cavity and pharynx without inflammation, swelling or lesion.  Respiratory: Respirations even and unlabored. Lungs clear to auscultation bilaterally.   No wheezes, crackles, or rhonchi.  Cardiovascular: Normal S1, S2. No MRG. Regular rate and rhythm. No peripheral  edema, cyanosis or pallor.  Gastrointestinal:  Soft, nondistended, nontender. No rebound or guarding. Normal bowel sounds. No appreciable masses or hepatomegaly. Rectal:  External: deep posterior fissure; Internal: not done due to fissure Msk:  Symmetrical without gross deformities. Without edema, no deformity or joint abnormality.  Neurologic:  Alert and  oriented x4;  grossly normal neurologically.  Skin:   Dry and intact without significant lesions or rashes. Psychiatric: Demonstrates good judgement and reason without abnormal affect or behaviors.  No recent labs.  Assessment: 1.  Rectal bleeding: Every other bowel movement for the past 2 months, likely due to below 2.  Anal fissure: Seen at time of exam today and  likely cause of above 3.  Personal history of adenomatous polyps: Repeat surveillance colonoscopy due 10/27/2021  Plan: 1.  Prescribed Nitroglycerin ointment 0.125% 3 times daily x6-8 weeks. 2.  Recommend the patient do sitz bath's for 15 to 20 minutes 2-3 times a day 3.  Encouraged the patient to maintain a soft solid stool. 4.  Patient to follow in clinic with me in 4 to 6 weeks.  If at that time he continues with bleeding then would recommend we go ahead with his colonoscopy.  Ellouise Newer, PA-C [facility] 11/23/2019, 11:01 AM  Cc: Lawerance Cruel, MD

## 2019-11-24 ENCOUNTER — Telehealth: Payer: Self-pay | Admitting: Physician Assistant

## 2019-11-24 MED ORDER — AMBULATORY NON FORMULARY MEDICATION: 1.0000 | Freq: Three times a day (TID) | 1 refills | Status: DC

## 2019-11-24 NOTE — Telephone Encounter (Signed)
refaxed Nitroglycerin rx to [facility]

## 2019-11-24 NOTE — Progress Notes (Signed)
I agree with the above note, plan 

## 2019-11-24 NOTE — Telephone Encounter (Signed)
Nitroglycerin compound was supposed to be sent to [facility].  Please resend prescription.

## 2019-12-08 ENCOUNTER — Emergency Department (HOSPITAL_COMMUNITY): Payer: Medicare Other

## 2019-12-08 ENCOUNTER — Other Ambulatory Visit: Payer: Self-pay

## 2019-12-08 ENCOUNTER — Emergency Department (HOSPITAL_COMMUNITY)
Admission: EM | Admit: 2019-12-08 | Discharge: 2019-12-09 | Disposition: A | Discharge status: Home / Self Care | Payer: Medicare Other | Attending: Emergency Medicine | Admitting: Emergency Medicine

## 2019-12-08 ENCOUNTER — Encounter (HOSPITAL_COMMUNITY): Payer: Self-pay

## 2019-12-08 DIAGNOSIS — R55 Syncope and collapse: Secondary | ICD-10-CM | POA: Insufficient documentation

## 2019-12-08 DIAGNOSIS — Z79899 Other long term (current) drug therapy: Secondary | ICD-10-CM | POA: Insufficient documentation

## 2019-12-08 DIAGNOSIS — Z96643 Presence of artificial hip joint, bilateral: Secondary | ICD-10-CM | POA: Diagnosis not present

## 2019-12-08 DIAGNOSIS — Z7982 Long term (current) use of aspirin: Secondary | ICD-10-CM | POA: Insufficient documentation

## 2019-12-08 LAB — BASIC METABOLIC PANEL
Anion gap: 9 (ref 5–15)
BUN: 29 mg/dL — ABNORMAL HIGH (ref 8–23)
CO2: 27 mmol/L (ref 22–32)
Calcium: 9.6 mg/dL (ref 8.9–10.3)
Chloride: 102 mmol/L (ref 98–111)
Creatinine, Ser: 1.13 mg/dL (ref 0.61–1.24)
GFR calc Af Amer: >60 mL/min (ref >60)
GFR calc non Af Amer: >60 mL/min (ref >60)
Glucose, Bld: 98 mg/dL (ref 70–99)
Potassium: 4.1 mmol/L (ref 3.5–5.1)
Sodium: 138 mmol/L (ref 135–145)

## 2019-12-08 LAB — CBC WITH DIFFERENTIAL/PLATELET
Abs Immature Granulocytes: 0.03 10*3/uL (ref 0.00–0.07)
Basophils Absolute: 0 10*3/uL (ref 0.0–0.1)
Basophils Relative: 0 %
Eosinophils Absolute: 0.1 10*3/uL (ref 0.0–0.5)
Eosinophils Relative: 1 %
HCT: 40 % (ref 39.0–52.0)
Hemoglobin: 12.9 g/dL — ABNORMAL LOW (ref 13.0–17.0)
Immature Granulocytes: 0 %
Lymphocytes Relative: 13 %
Lymphs Abs: 1.3 10*3/uL (ref 0.7–4.0)
MCH: 27.7 pg (ref 26.0–34.0)
MCHC: 32.3 g/dL (ref 30.0–36.0)
MCV: 85.8 fL (ref 80.0–100.0)
Monocytes Absolute: 0.7 10*3/uL (ref 0.1–1.0)
Monocytes Relative: 7 %
Neutro Abs: 8.2 10*3/uL — ABNORMAL HIGH (ref 1.7–7.7)
Neutrophils Relative %: 79 %
Platelets: 315 10*3/uL (ref 150–400)
RBC: 4.66 MIL/uL (ref 4.22–5.81)
RDW: 14.7 % (ref 11.5–15.5)
WBC: 10.3 10*3/uL (ref 4.0–10.5)
nRBC: 0 % (ref 0.0–0.2)

## 2019-12-08 MED ORDER — SODIUM CHLORIDE 0.9 % IV BOLUS
1000.0000 mL | Freq: Once | INTRAVENOUS | Status: AC
  Administered 2019-12-08: 22:00:00 1000 mL via INTRAVENOUS

## 2019-12-08 NOTE — ED Triage Notes (Signed)
Pt BIB [facility] from home for multiple syncopal episodes, hypotension. Initial BP 70 systolic by EMS. Pt has been "mowing yards for the past couple of days, only had one bottle water." 557mL fluid given, [id number] systolic palpated. Pt denies CP, N/V, endorses syncopy, both episodes witnessed by brother.  EKG unremarkable except slight tachycardia @ 110

## 2019-12-08 NOTE — ED Provider Notes (Signed)
[facility] EMERGENCY DEPARTMENT Provider Note   CSN: [id number] Arrival date & time: 12/08/19  2006     History Chief Complaint  Patient presents with  . Hypotension    Lance Rollins is a 74 y.o. male.  Patient is a 74 year old male with past medical history of arthritis, prostate cancer.  He presents today for evaluation of syncope.  Patient was at his brother's house this afternoon.  He had several sips of "gin and juice" when he then fainted and fell on the floor.  Patient had no warning of this episode coming and found himself unconscious for approximately 30 seconds.  He then woke up and was back to normal.  There was no reported seizure activity.  Patient denies any trauma to the tongue or lips.  He also denies any bowel or bladder incontinence.  I am told his blood pressure with EMS was low, however he is now normotensive and has no complaints.  He does report mowing grass the past few days, but denies any other contributing issues.  The history is provided by the patient.       Past Medical History:  Diagnosis Date  . Arthritis   . Bleeding per rectum 08/2013   "black and bright red" (08/14/2013)  . Hypercholesteremia   . Obesity   . Prostate cancer ([place]) 2011  . Unilateral primary osteoarthritis, right hip 04/23/2017    Patient Active Problem List   Diagnosis Date Noted  . Chronic pain syndrome 03/28/2019  . Trochanteric bursitis of both hips 03/28/2019  . 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 Presence of right artificial hip joint 06/02/2017  . Unilateral primary osteoarthritis, right hip 04/23/2017  . Status post total replacement of right hip 04/23/2017  . Arthritis of left hip 11/14/2013  . Status post THR (total hip replacement) 11/14/2013  . Unspecified gastritis and gastroduodenitis without mention of hemorrhage 08/15/2013  . Gastric polyp 08/15/2013    Past Surgical History:  Procedure Laterality Date  .  ESOPHAGOGASTRODUODENOSCOPY N/A 08/15/2013   Procedure: ESOPHAGOGASTRODUODENOSCOPY (EGD);  Surgeon: Milus Banister, MD;  Location: [place];  Service: Endoscopy;  Laterality: N/A;  . INGUINAL HERNIA REPAIR Bilateral ~ 2004  . ROBOT ASSISTED LAPAROSCOPIC RADICAL PROSTATECTOMY  08/2009   [name] 08/14/2009 (08/14/2013)  . TOTAL HIP ARTHROPLASTY Left 11/14/2013   Procedure: LEFT TOTAL HIP ARTHROPLASTY ANTERIOR APPROACH;  Surgeon: Mcarthur Rossetti, MD;  Location: [place];  Service: Orthopedics;  Laterality: Left;  . TOTAL HIP ARTHROPLASTY Right 04/23/2017   Procedure: RIGHT TOTAL HIP ARTHROPLASTY ANTERIOR APPROACH;  Surgeon: Mcarthur Rossetti, MD;  Location: [facility];  Service: Orthopedics;  Laterality: Right;       Family History  Problem Relation Age of Onset  . Diabetes Sister   . Diabetes Mother   . Breast cancer Mother   . Colon cancer Neg Hx     Social History   Tobacco Use  . Smoking status: Never Smoker  . Smokeless tobacco: Never Used  Substance Use Topics  . Alcohol use: Yes    Comment: 08/14/2013 "might have a drink q other holiday"  . Drug use: No    Home Medications Prior to Admission medications   Medication Sig Start Date End Date Taking? Authorizing Provider  AMBULATORY NON FORMULARY MEDICATION Place 1 Dose rectally in the morning, at noon, and at bedtime. Medication Name: Nitroglycerin 0.125%;   Use rectally three times a day for 6-8 weeks. 11/24/19   Levin Erp,  PA  aspirin 325 MG EC tablet Take 1 tablet (325 mg total) by mouth 2 (two) times daily after a meal. 04/24/17   Mcarthur Rossetti, MD  cyclobenzaprine (FLEXERIL) 10 MG tablet Take 1 tablet (10 mg total) by mouth 3 (three) times daily as needed for muscle spasms (hip tightness). 10/25/19   Lovorn, Jinny Blossom, MD  diclofenac (VOLTAREN) 75 MG EC tablet Take 1 tablet (75 mg total) by mouth daily as needed. 10/25/19   Lovorn, Jinny Blossom, MD  irbesartan-hydrochlorothiazide (AVALIDE) 300-12.5 MG tablet Take 1  tablet by mouth daily. 11/03/19   [provider]  Multiple Vitamin (MULTIVITAMIN WITH MINERALS) TABS tablet Take 1 tablet by mouth daily.    [provider]  nabumetone (RELAFEN) 750 MG tablet Take 1 tablet by mouth twice daily as needed 11/03/19   Mcarthur Rossetti, MD  simvastatin (ZOCOR) 40 MG tablet Take 40 mg by mouth at bedtime. 08/09/19   [provider]    Allergies    Patient has no known allergies.  Review of Systems   Review of Systems  All other systems reviewed and are negative.   Physical Exam Updated Vital Signs BP (!) 149/79 (BP Location: Right Arm)   Pulse 88   Temp 98.4 F (36.9 C) (Oral)   Resp (!) 25   Ht 5\' 7"  (1.702 m)   Wt 113.9 kg   SpO2 97%   BMI 39.31 kg/m   Physical Exam Vitals and nursing note reviewed.  Constitutional:      General: He is not in acute distress.    Appearance: He is well-developed. He is not diaphoretic.  HENT:     Head: Normocephalic and atraumatic.  Cardiovascular:     Rate and Rhythm: Normal rate and regular rhythm.     Heart sounds: No murmur. No friction rub.  Pulmonary:     Effort: Pulmonary effort is normal. No respiratory distress.     Breath sounds: Normal breath sounds. No wheezing or rales.  Abdominal:     General: Bowel sounds are normal. There is no distension.     Palpations: Abdomen is soft.     Tenderness: There is no abdominal tenderness.  Musculoskeletal:        General: Normal range of motion.     Cervical back: Normal range of motion and neck supple.  Skin:    General: Skin is warm and dry.  Neurological:     General: No focal deficit present.     Mental Status: He is alert and oriented to person, place, and time.     Cranial Nerves: No cranial nerve deficit.     Motor: No weakness.     Coordination: Coordination normal.     ED Results / Procedures / Treatments   Labs (all labs ordered are listed, but only abnormal results are displayed) Labs Reviewed  BASIC  METABOLIC PANEL  CBC WITH DIFFERENTIAL/PLATELET    EKG EKG Interpretation  Date/Time:  Friday December 08 2019 20:25:49 EDT Ventricular Rate:  92 PR Interval:    QRS Duration: 90 QT Interval:  353 QTC Calculation: 437 R Axis:   34 Text Interpretation: Sinus rhythm Prominent P waves, nondiagnostic No significant change since 08/15/2009 Confirmed by Veryl Speak [phone]) on 12/08/2019 9:09:03 PM   Radiology No results found.  Procedures Procedures (including critical care time)  Medications Ordered in ED Medications  sodium chloride 0.9 % bolus 1,000 mL (has no administration in time range)    ED Course  I have  reviewed the triage vital signs and the nursing notes.  Pertinent labs & imaging results that were available during my care of the patient were reviewed by me and considered in my medical decision making (see chart for details).    MDM Rules/Calculators/A&P  Patient presenting here with complaints of low blood pressure and syncope.  He was at his brother's house having some sips of gin and juice when this episode occurred.  I am told he was hypotensive on scene, however blood pressures have normalized here.  His electrolyte panel and blood counts are essentially unremarkable, EKG is normal, and CT scan of the head is negative.  He has been hydrated with normal saline and observed for several hours in the ER.  At this point, I feel as though discharge is appropriate.  I suspect a vasovagal etiology.  Patient has been mowing grass for the past several days and I suspect maybe he has become dehydrated.  He has been given normal saline here in the ER and seems fine for discharge.  Final Clinical Impression(s) / ED Diagnoses Final diagnoses:  None    Rx / DC Orders ED Discharge Orders    None       Veryl Speak, MD 12/08/19 [id number]

## 2019-12-08 NOTE — Discharge Instructions (Addendum)
Drink plenty of fluids and get plenty of rest.  Return to the emergency department if you experience any new and/or concerning symptoms.

## 2019-12-15 ENCOUNTER — Other Ambulatory Visit: Payer: Self-pay | Admitting: Orthopaedic Surgery

## 2019-12-18 ENCOUNTER — Encounter: Payer: Self-pay | Admitting: Physician Assistant

## 2019-12-18 ENCOUNTER — Ambulatory Visit: Payer: Medicare Other | Admitting: Physician Assistant

## 2019-12-18 VITALS — BP 128/79 | HR 92 | Temp 97.6°F | Ht 67.0 in | Wt 258.1 lb

## 2019-12-18 DIAGNOSIS — K625 Hemorrhage of anus and rectum: Secondary | ICD-10-CM

## 2019-12-18 DIAGNOSIS — Z86010 Personal history of colonic polyps: Secondary | ICD-10-CM

## 2019-12-18 DIAGNOSIS — K602 Anal fissure, unspecified: Secondary | ICD-10-CM | POA: Diagnosis not present

## 2019-12-18 DIAGNOSIS — Z860101 Personal history of adenomatous and serrated colon polyps: Secondary | ICD-10-CM

## 2019-12-18 MED ORDER — NA SULFATE-K SULFATE-MG SULF 17.5-3.13-1.6 GM/177ML PO SOLN
1.0000 | Freq: Once | ORAL | 0 refills | Status: AC
Start: 2019-12-18 — End: 2019-12-18

## 2019-12-18 NOTE — Patient Instructions (Addendum)
If you are age 72 or older, your body mass index should be between 23-30. Your Body mass index is 40.43 kg/m. If this is out of the aforementioned range listed, please consider follow up with your Primary Care Provider.  If you are age 58 or younger, your body mass index should be between 19-25. Your Body mass index is 40.43 kg/m. If this is out of the aformentioned range listed, please consider follow up with your Primary Care Provider.   Continue Nitroglycerin gel for now.   You have been scheduled for a colonoscopy. Please follow written instructions given to you at your visit today.  Please pick up your prep supplies at the pharmacy within the next 1-3 days. If you use inhalers (even only as needed), please bring them with you on the day of your procedure.

## 2019-12-18 NOTE — Progress Notes (Signed)
I agree with the above note, plan 

## 2019-12-18 NOTE — Progress Notes (Signed)
Chief Complaint: Follow-up anal fissure  HPI:    Lance Rollins is a 74 year old African-American male with past medical history as listed below, known to Dr. Ardis Rollins, who patient returns to clinic today for follow-up of his anal fissure.    10/27/2016 colonoscopy for personal history of adenomatous polyps in 2015.  Findings of 1 4 mm polyp in the proximal transverse colon, diverticulosis in the entire colon otherwise normal.  Pathology showed tubular adenoma.  Repeat recommended 5 years.    11/23/2019 patient seen in clinic and described that ever since February had been passing stool with some bright red blood on top of it.  On exam a deep posterior fissure.  Prescribed nitroglycerin ointment 3 times daily x6-8 weeks.  Also recommended sitz bath's to maintain a soft solid stool.  It was explained that if he continues with bleeding in 4 to 6 weeks of follow-up and recommend a colonoscopy.    Today, the patient presents to clinic and tells me he has been applying Nitroglycerin 3 times a day as well as doing sitz bath's and nothing has changed.  He continues to see bright red blood in his stool.  Tells me it is slightly worse if the stool is at all hard, but he also sees it on a soft stool as well.  Denies any new symptoms.    Denies fever, chills, weight loss, abdominal pain or symptoms that awaken him from sleep.  Past Medical History:  Diagnosis Date  . Arthritis   . Bleeding per rectum 08/2013   "black and bright red" (08/14/2013)  . Hypercholesteremia   . Obesity   . Prostate cancer ([place]) 2011  . Unilateral primary osteoarthritis, right hip 04/23/2017    Past Surgical History:  Procedure Laterality Date  . ESOPHAGOGASTRODUODENOSCOPY N/A 08/15/2013   Procedure: ESOPHAGOGASTRODUODENOSCOPY (EGD);  Surgeon: Lance Banister, MD;  Location: [facility];  Service: Endoscopy;  Laterality: N/A;  . INGUINAL HERNIA REPAIR Bilateral ~ 2004  . ROBOT ASSISTED LAPAROSCOPIC RADICAL PROSTATECTOMY  08/2009   Lance Rollins 08/14/2009 (08/14/2013)  . TOTAL HIP ARTHROPLASTY Left 11/14/2013   Procedure: LEFT TOTAL HIP ARTHROPLASTY ANTERIOR APPROACH;  Surgeon: Lance Rossetti, MD;  Location: [facility];  Service: Orthopedics;  Laterality: Left;  . TOTAL HIP ARTHROPLASTY Right 04/23/2017   Procedure: RIGHT TOTAL HIP ARTHROPLASTY ANTERIOR APPROACH;  Surgeon: Lance Rossetti, MD;  Location: [facility];  Service: Orthopedics;  Laterality: Right;    Current Outpatient Medications  Medication Sig Dispense Refill  . AMBULATORY NON FORMULARY MEDICATION Place 1 Dose rectally in the morning, at noon, and at bedtime. Medication Name: Nitroglycerin 0.125%;   Use rectally three times a day for 6-8 weeks. 30 g 1  . aspirin 325 MG EC tablet Take 1 tablet (325 mg total) by mouth 2 (two) times daily after a meal. 40 tablet 0  . cyclobenzaprine (FLEXERIL) 10 MG tablet Take 1 tablet (10 mg total) by mouth 3 (three) times daily as needed for muscle spasms (hip tightness). 60 tablet 11  . diclofenac (VOLTAREN) 75 MG EC tablet Take 1 tablet (75 mg total) by mouth daily as needed. 30 tablet 11  . irbesartan-hydrochlorothiazide (AVALIDE) 300-12.5 MG tablet Take 1 tablet by mouth daily.    . Multiple Vitamin (MULTIVITAMIN WITH MINERALS) TABS tablet Take 1 tablet by mouth daily.    . nabumetone (RELAFEN) 750 MG tablet Take 1 tablet by mouth twice daily as needed 60 tablet 0  . simvastatin (ZOCOR) 40 MG tablet Take 40 mg by  mouth at bedtime.     Current Facility-Administered Medications  Medication Dose Route Frequency Provider Last Rate Last Admin  . 0.9 %  sodium chloride infusion  500 mL Intravenous Continuous Lance Banister, MD        Allergies as of 12/18/2019  . (No Known Allergies)    Family History  Problem Relation Age of Onset  . Diabetes Sister   . Diabetes Mother   . Breast cancer Mother   . Colon cancer Neg Hx     Social History   Socioeconomic History  . Marital status: Married    Spouse name: Not on file   . Number of children: 5  . Years of education: Not on file  . Highest education level: Not on file  Occupational History  . Occupation: retired  Tobacco Use  . Smoking status: Never Smoker  . Smokeless tobacco: Never Used  Substance and Sexual Activity  . Alcohol use: Yes    Comment: 08/14/2013 "might have a drink q other holiday"  . Drug use: No  . Sexual activity: Not Currently  Other Topics Concern  . Not on file  Social History Narrative  . Not on file   Social Determinants of Health   Financial Resource Strain:   . Difficulty of Paying Living Expenses:   Food Insecurity:   . Worried About Charity fundraiser in the Last Year:   . Arboriculturist in the Last Year:   Transportation Needs:   . Film/video editor (Medical):   Lance Rollins Kitchen Lack of Transportation (Non-Medical):   Physical Activity:   . Days of Exercise per Week:   . Minutes of Exercise per Session:   Stress:   . Feeling of Stress :   Social Connections:   . Frequency of Communication with Friends and Family:   . Frequency of Social Gatherings with Friends and Family:   . Attends Religious Services:   . Active Member of Clubs or Organizations:   . Attends Archivist Meetings:   Lance Rollins Kitchen Marital Status:   Intimate Partner Violence:   . Fear of Current or Ex-Partner:   . Emotionally Abused:   Lance Rollins Kitchen Physically Abused:   . Sexually Abused:     Review of Systems:    Constitutional: No weight loss, fever or chills Cardiovascular: No chest pain Respiratory: No SOB Gastrointestinal: See HPI and otherwise negative   Physical Exam:  Vital signs: BP 128/79   Pulse 92   Temp 97.6 F (36.4 C)   Ht 5\' 7"  (1.702 m)   Wt 258 lb 2 oz (117.1 kg)   BMI 40.43 kg/m   Constitutional:   Pleasant AA male appears to be in NAD, Well developed, Well nourished, alert and cooperative Respiratory: Respirations even and unlabored. Lungs clear to auscultation bilaterally.   No wheezes, crackles, or rhonchi.  Cardiovascular:  Normal S1, S2. No MRG. Regular rate and rhythm. No peripheral edema, cyanosis or pallor.  Gastrointestinal:  Soft, nondistended, nontender. No rebound or guarding. Normal bowel sounds. No appreciable masses or hepatomegaly. Psychiatric:Demonstrates good judgement and reason without abnormal affect or behaviors.  RELEVANT LABS AND IMAGING: CBC    Component Value Date/Time   WBC 10.3 12/08/2019 2206   RBC 4.66 12/08/2019 2206   HGB 12.9 (L) 12/08/2019 2206   HCT 40.0 12/08/2019 2206   PLT 315 12/08/2019 2206   MCV 85.8 12/08/2019 2206   MCH 27.7 12/08/2019 2206   MCHC 32.3 12/08/2019 2206   RDW 14.7  12/08/2019 2206   LYMPHSABS 1.3 12/08/2019 2206   MONOABS 0.7 12/08/2019 2206   EOSABS 0.1 12/08/2019 2206   BASOSABS 0.0 12/08/2019 2206    CMP     Component Value Date/Time   NA 138 12/08/2019 2206   K 4.1 12/08/2019 2206   CL 102 12/08/2019 2206   CO2 27 12/08/2019 2206   GLUCOSE 98 12/08/2019 2206   BUN 29 (H) 12/08/2019 2206   CREATININE 1.13 12/08/2019 2206   CALCIUM 9.6 12/08/2019 2206   PROT 7.6 03/03/2016 1227   ALBUMIN 4.1 03/03/2016 1227   AST 21 03/03/2016 1227   ALT 25 03/03/2016 1227   ALKPHOS 81 03/03/2016 1227   BILITOT 1.0 03/03/2016 1227   GFRNONAA >60 12/08/2019 2206   GFRAA >60 12/08/2019 2206    Assessment: 1.  Rectal bleeding: Thought related to fissure at time of exam 1 month ago, but no change over the past month; consider other sources such as polyp versus cancer versus other 2.  Anal fissure: Seen at time of last exam and thought to be source of bleeding but no improvement over the past 6 weeks with Nitroglycerin 3 times daily 3.  History of adenomatous polyps: Last colonoscopy in 2018 with repeat recommended in 5 years  Plan: 1.  Scheduled patient for a diagnostic colonoscopy in the [place] with Dr. Ardis Rollins.  Did discuss risks, benefits, limitations and alternatives and patient agrees to proceed. 2.  Continue Nitroglycerin for now and sitz bath's. 3.   Patient to follow in clinic per recommendations from Dr. Ardis Rollins after time of procedure.  Ellouise Newer, PA-C [facility] 12/18/2019, 9:57 AM  Cc: Lawerance Cruel, MD

## 2020-01-17 ENCOUNTER — Encounter: Payer: Self-pay | Admitting: Gastroenterology

## 2020-01-23 ENCOUNTER — Encounter: Payer: Self-pay | Admitting: Gastroenterology

## 2020-01-31 ENCOUNTER — Other Ambulatory Visit: Payer: Self-pay | Admitting: Orthopaedic Surgery

## 2020-02-05 ENCOUNTER — Encounter: Payer: Self-pay | Admitting: Gastroenterology

## 2020-03-08 ENCOUNTER — Encounter: Payer: Self-pay | Admitting: Gastroenterology

## 2020-03-08 ENCOUNTER — Ambulatory Visit (AMBULATORY_SURGERY_CENTER): Payer: Medicare Other | Admitting: Gastroenterology

## 2020-03-08 ENCOUNTER — Other Ambulatory Visit: Payer: Self-pay

## 2020-03-08 VITALS — BP 126/77 | HR 76 | Temp 96.6°F | Resp 21 | Ht 67.0 in | Wt 258.0 lb

## 2020-03-08 DIAGNOSIS — K529 Noninfective gastroenteritis and colitis, unspecified: Secondary | ICD-10-CM

## 2020-03-08 DIAGNOSIS — D123 Benign neoplasm of transverse colon: Secondary | ICD-10-CM

## 2020-03-08 DIAGNOSIS — K625 Hemorrhage of anus and rectum: Secondary | ICD-10-CM

## 2020-03-08 MED ORDER — SODIUM CHLORIDE 0.9 % IV SOLN: 500.0000 mL | Freq: Once | INTRAVENOUS | Status: DC

## 2020-03-08 NOTE — Progress Notes (Signed)
PT taken to PACU. Monitors in place. VSS. Report given to RN. 

## 2020-03-08 NOTE — Patient Instructions (Signed)
Handouts on polyps and hemorrhoids given to you today  Await pathology results on polyp removed    Await results of biopsies taken of redness and inflammation in rectal area    YOU HAD AN ENDOSCOPIC PROCEDURE TODAY AT [facility]:   Refer to the procedure report that was given to you for any specific questions about what was found during the examination.  If the procedure report does not answer your questions, please call your gastroenterologist to clarify.  If you requested that your care partner not be given the details of your procedure findings, then the procedure report has been included in a sealed envelope for you to review at your convenience later.  YOU SHOULD EXPECT: Some feelings of bloating in the abdomen. Passage of more gas than usual.  Walking can help get rid of the air that was put into your GI tract during the procedure and reduce the bloating. If you had a lower endoscopy (such as a colonoscopy or flexible sigmoidoscopy) you may notice spotting of blood in your stool or on the toilet paper. If you underwent a bowel prep for your procedure, you may not have a normal bowel movement for a few days.  Please Note:  You might notice some irritation and congestion in your nose or some drainage.  This is from the oxygen used during your procedure.  There is no need for concern and it should clear up in a day or so.  SYMPTOMS TO REPORT IMMEDIATELY:   Following lower endoscopy (colonoscopy or flexible sigmoidoscopy):  Excessive amounts of blood in the stool  Significant tenderness or worsening of abdominal pains  Swelling of the abdomen that is new, acute  Fever of 100F or higher    For urgent or emergent issues, a gastroenterologist can be reached at any hour by calling [phone]. Do not use MyChart messaging for urgent concerns.    DIET:  We do recommend a small meal at first, but then you may proceed to your regular diet.  Drink plenty of fluids but  you should avoid alcoholic beverages for 24 hours.  ACTIVITY:  You should plan to take it easy for the rest of today and you should NOT DRIVE or use heavy machinery until tomorrow (because of the sedation medicines used during the test).    FOLLOW UP: Our staff will call the number listed on your records 48-72 hours following your procedure to check on you and address any questions or concerns that you may have regarding the information given to you following your procedure. If we do not reach you, we will leave a message.  We will attempt to reach you two times.  During this call, we will ask if you have developed any symptoms of COVID 19. If you develop any symptoms (ie: fever, flu-like symptoms, shortness of breath, cough etc.) before then, please call [phone].  If you test positive for Covid 19 in the 2 weeks post procedure, please call and report this information to [place].    If any biopsies were taken you will be contacted by phone or by letter within the next 1-3 weeks.  Please call us at [phone] if you have not heard about the biopsies in 3 weeks.    SIGNATURES/CONFIDENTIALITY: You and/or your care partner have signed paperwork which will be entered into your electronic medical record.  These signatures attest to the fact that that the information above on your After Visit Summary has been reviewed and  is understood.  Full responsibility of the confidentiality of this discharge information lies with you and/or your care-partner.

## 2020-03-08 NOTE — Progress Notes (Signed)
Called to room to assist during endoscopic procedure.  Patient ID and intended procedure confirmed with present staff. Received instructions for my participation in the procedure from the performing physician.  

## 2020-03-08 NOTE — Op Note (Signed)
[facility] Patient Name: Lance Rollins Procedure Date: 03/08/2020 2:58 PM MRN: [id number] Endoscopist: Milus Banister , MD Age: 74 Referring MD:  Date of Birth: [birth year] Gender: Male Account #: [id number] Procedure:                Colonoscopy Indications:              Rectal bleeding; 10/27/2016 colonoscopy for personal                            history of adenomatous polyps in 2015. Findings of                            1 4 mm polyp in the proximal transverse colon,                            diverticulosis in the entire colon otherwise                            normal. Pathology showed tubular adenoma. Medicines:                Monitored Anesthesia Care Procedure:                Pre-Anesthesia Assessment:                           - Prior to the procedure, a History and Physical                            was performed, and patient medications and                            allergies were reviewed. The patient's tolerance of                            previous anesthesia was also reviewed. The risks                            and benefits of the procedure and the sedation                            options and risks were discussed with the patient.                            All questions were answered, and informed consent                            was obtained. Prior Anticoagulants: The patient has                            taken no previous anticoagulant or antiplatelet                            agents. ASA Grade Assessment: II - A patient with  mild systemic disease. After reviewing the risks                            and benefits, the patient was deemed in                            satisfactory condition to undergo the procedure.                           After obtaining informed consent, the colonoscope                            was passed under direct vision. Throughout the                            procedure, the patient's  blood pressure, pulse, and                            oxygen saturations were monitored continuously. The                            Colonoscope was introduced through the anus and                            advanced to the the cecum, identified by                            appendiceal orifice and ileocecal valve. The                            colonoscopy was performed without difficulty. The                            patient tolerated the procedure well. The quality                            of the bowel preparation was good. The ileocecal                            valve, appendiceal orifice, and rectum were                            photographed. Scope In: 3:07:13 PM Scope Out: 3:24:38 PM Scope Withdrawal Time: 0 hours 12 minutes 44 seconds  Total Procedure Duration: 0 hours 17 minutes 25 seconds  Findings:                 A 4 mm polyp was found in the transverse colon. The                            polyp was sessile. The polyp was removed with a                            cold snare. Resection and retrieval were complete.  Mild inflammation characterized by erythema was                            found in the very distal rectum (1cm of                            erythematous, congested mucosa). Biopsies were                            taken with a cold forceps for histology.                           Internal hemorrhoids were found. The hemorrhoids                            were small.                           The exam was otherwise without abnormality on                            direct and retroflexion views. Complications:            No immediate complications. Estimated blood loss:                            None. Estimated Blood Loss:     Estimated blood loss: none. Impression:               - One 4 mm polyp in the transverse colon, removed                            with a cold snare. Resected and retrieved.                           - Mild  inflammation was found in the distal rectum.                            Biopsied to check for very short segment proctitis.                           - Small internal hemorrhoids.                           - The examination was otherwise normal on direct                            and retroflexion views. Recommendation:           - Patient has a contact number available for                            emergencies. The signs and symptoms of potential                            delayed complications were discussed with the  patient. Return to normal activities tomorrow.                            Written discharge instructions were provided to the                            patient.                           - Resume previous diet.                           - Continue present medications.                           - Await pathology results. Milus Banister, MD 03/08/2020 3:30:52 PM This report has been signed electronically.

## 2020-03-08 NOTE — Progress Notes (Signed)
VS by CW. ?

## 2020-03-12 ENCOUNTER — Telehealth: Payer: Self-pay | Admitting: *Deleted

## 2020-03-12 NOTE — Telephone Encounter (Signed)
°  Follow up Call-  Call back number 03/08/2020  Post procedure Call Back phone  # [phone]  Permission to leave phone message Yes  Some recent data might be hidden     Patient questions:  Do you have a fever, pain , or abdominal swelling? No. Pain Score  0 *  Have you tolerated food without any problems? Yes.    Have you been able to return to your normal activities? Yes.    Do you have any questions about your discharge instructions: Diet   No. Medications  No. Follow up visit  No.  Do you have questions or concerns about your Care? No.  Actions: * If pain score is 4 or above: No action needed, pain <4  1. Have you developed a fever since your procedure? NO  2.   Have you had an respiratory symptoms (SOB or cough) since your procedure? NO  3.   Have you tested positive for COVID 19 since your procedure NO  4.   Have you had any family members/close contacts diagnosed with the COVID 19 since your procedure?  NO   If yes to any of these questions please route to Joylene John, RN and Erenest Rasher, RN

## 2020-03-15 ENCOUNTER — Encounter: Payer: Self-pay | Admitting: Gastroenterology

## 2020-03-18 ENCOUNTER — Other Ambulatory Visit: Payer: Self-pay | Admitting: Orthopaedic Surgery

## 2020-04-18 ENCOUNTER — Ambulatory Visit (INDEPENDENT_AMBULATORY_CARE_PROVIDER_SITE_OTHER): Payer: Medicare Other | Admitting: Physician Assistant

## 2020-04-18 ENCOUNTER — Ambulatory Visit: Payer: Self-pay

## 2020-04-18 ENCOUNTER — Encounter: Payer: Self-pay | Admitting: Physician Assistant

## 2020-04-18 DIAGNOSIS — T84031S Mechanical loosening of internal left hip prosthetic joint, sequela: Secondary | ICD-10-CM | POA: Diagnosis not present

## 2020-04-18 DIAGNOSIS — Z96642 Presence of left artificial hip joint: Secondary | ICD-10-CM | POA: Diagnosis not present

## 2020-04-18 DIAGNOSIS — Z96641 Presence of right artificial hip joint: Secondary | ICD-10-CM | POA: Diagnosis not present

## 2020-04-18 NOTE — Progress Notes (Addendum)
Office Visit Note   Patient: Lance Rollins           Date of Birth: 10/16/1945           MRN: [id number] Visit Date: 04/18/2020              Requested by: Lawerance Cruel, [place],  [place] [id number] PCP: Lawerance Cruel, MD   Assessment & Plan: Visit Diagnoses:  1. Presence of right artificial hip joint   2. Presence of left artificial hip joint   3. Femoral loosening of prosthetic left hip, sequela     Plan: Due to the fact that Lance Rollins continues to have left hip pain despite time and conservative treatment recommend revision left total hip arthroplasty of 1-2 components.  However due to the Covid pandemic we are not scheduling any elective procedures that require an overnight stay.  We will schedule surgery once we are doing elective surgeries with overnight stay being permitted as he will need to remain overnight postoperatively. Questions encouraged and answered at length.  Risk benefits discussed with patient at length.  He is given Samella Parr card and we will contact him once we are able to schedule surgery.  Follow-Up Instructions: Return 2 weeks post op.   Orders:  Orders Placed This Encounter  Procedures  . XR Pelvis 1-2 Views   No orders of the defined types were placed in this encounter.     Procedures: No procedures performed   Clinical Data: No additional findings.   Subjective: Chief Complaint  Patient presents with  . Pelvis - Follow-up    HPI  Lance Rollins returns today follow-up of his left hip pain.  Again he underwent a left total hip arthroplasty in 2015 and a right total hip arthroplasty in 2018.  He has had no problems with the right hip.  However he has continued to have chronic pain in the left hip.  He denies any new injury.  He states his pain is becoming worse.  He states yesterday he was unable to bear weight on the left hip due to pain.  Pains anterior aspect of the thigh described as achy pain worse with  ambulation.  Is also worse at the end of the day.  He has no radicular symptoms down the leg the pain does not radiate down beyond the knee.  Ranks his pain to be 9 out of 10 pain at worst.  He denies any fevers chills shortness breath chest pain. May 2020 he underwent a left hip MRI which showed no evidence of loosening or hardware failure.  There were no fluid collections around the hip joint.  Small amount of fluid along the iliopsoas tendon was seen. June 2020 he underwent a three-phase bone scan which showed no evidence of loosening or infection of the left hip.  Delayed increased tracer accumulation over the left lesser trochanteric region which suggested a stress reaction.  Review of Systems  Constitutional: Negative for chills and fever.  Respiratory: Negative for shortness of breath.   Cardiovascular: Negative for chest pain.  Musculoskeletal: Positive for arthralgias and gait problem.     Objective: Vital Signs: There were no vitals taken for this visit.  Physical Exam Constitutional:      Appearance: He is not ill-appearing or diaphoretic.  Pulmonary:     Effort: Pulmonary effort is normal.  Neurological:     Mental Status: He is alert and oriented to person, place, and time.  Psychiatric:        Mood and Affect: Mood normal.     Ortho Exam Right hip excellent range of motion without pain.  Left hip pain with internal and external rotation slight guarding.  Walks with a slight antalgic gait with use cane. Specialty Comments:  No specialty comments available.  Imaging: XR Pelvis 1-2 Views  Result Date: 04/18/2020 AP pelvis: No acute fractures.  No subluxation dislocation right hip.  Right total hip arthroplasty components well-seated.  Left total hip arthroplasty femoral component with some lucency is proximally.  Distally the stem appears well seated.  Acetabular component appears well-seated on the left side.      PMFS History: Patient Active Problem List    Diagnosis Date Noted  . Chronic pain syndrome 03/28/2019  . Trochanteric bursitis of both hips 03/28/2019  . 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 Presence of right artificial hip joint 06/02/2017  . Unilateral primary osteoarthritis, right hip 04/23/2017  . Status post total replacement of right hip 04/23/2017  . Arthritis of left hip 11/14/2013  . Status post THR (total hip replacement) 11/14/2013  . Unspecified gastritis and gastroduodenitis without mention of hemorrhage 08/15/2013  . Gastric polyp 08/15/2013   Past Medical History:  Diagnosis Date  . Arthritis   . Bleeding per rectum 08/2013   "black and bright red" (08/14/2013)  . Hypercholesteremia   . Hypertension   . Obesity   . Prostate cancer ([facility]) 2011  . Unilateral primary osteoarthritis, right hip 04/23/2017    Family History  Problem Relation Age of Onset  . Diabetes Sister   . Diabetes Mother   . Breast cancer Mother   . Colon cancer Neg Hx   . Stomach cancer Neg Hx   . Esophageal cancer Neg Hx   . Pancreatic cancer Neg Hx     Past Surgical History:  Procedure Laterality Date  . ESOPHAGOGASTRODUODENOSCOPY N/A 08/15/2013   Procedure: ESOPHAGOGASTRODUODENOSCOPY (EGD);  Surgeon: Milus Banister, MD;  Location: [facility];  Service: Endoscopy;  Laterality: N/A;  . INGUINAL HERNIA REPAIR Bilateral ~ 2004  . ROBOT ASSISTED LAPAROSCOPIC RADICAL PROSTATECTOMY  08/2009   [name] 08/14/2009 (08/14/2013)  . TOTAL HIP ARTHROPLASTY Left 11/14/2013   Procedure: LEFT TOTAL HIP ARTHROPLASTY ANTERIOR APPROACH;  Surgeon: Mcarthur Rossetti, MD;  Location: [facility];  Service: Orthopedics;  Laterality: Left;  . TOTAL HIP ARTHROPLASTY Right 04/23/2017   Procedure: RIGHT TOTAL HIP ARTHROPLASTY ANTERIOR APPROACH;  Surgeon: Mcarthur Rossetti, MD;  Location: [facility];  Service: Orthopedics;  Laterality: Right;   Social History   Occupational History  . Occupation: retired  Tobacco Use  . Smoking status: Never Smoker    . Smokeless tobacco: Never Used  Vaping Use  . Vaping Use: Never used  Substance and Sexual Activity  . Alcohol use: Yes    Comment: 08/14/2013 "might have a drink q other holiday"  . Drug use: No  . Sexual activity: Not Currently

## 2020-04-26 ENCOUNTER — Ambulatory Visit: Payer: Medicare Other | Admitting: Physical Medicine and Rehabilitation

## 2020-05-01 ENCOUNTER — Encounter
Payer: Medicare Other | Attending: Physical Medicine and Rehabilitation | Admitting: Physical Medicine and Rehabilitation

## 2020-05-01 ENCOUNTER — Encounter: Payer: Self-pay | Admitting: Physical Medicine and Rehabilitation

## 2020-05-01 ENCOUNTER — Other Ambulatory Visit: Payer: Self-pay

## 2020-05-01 VITALS — BP 136/73 | HR 94 | Temp 98.2°F | Ht 67.0 in | Wt 255.2 lb

## 2020-05-01 DIAGNOSIS — M7061 Trochanteric bursitis, right hip: Secondary | ICD-10-CM | POA: Insufficient documentation

## 2020-05-01 DIAGNOSIS — M7062 Trochanteric bursitis, left hip: Secondary | ICD-10-CM | POA: Diagnosis present

## 2020-05-01 DIAGNOSIS — G894 Chronic pain syndrome: Secondary | ICD-10-CM | POA: Diagnosis not present

## 2020-05-01 MED ORDER — DICLOFENAC SODIUM 75 MG PO TBEC: 75.0000 mg | DELAYED_RELEASE_TABLET | Freq: Every day | ORAL | 11 refills | Status: DC | PRN

## 2020-05-01 MED ORDER — CYCLOBENZAPRINE HCL 10 MG PO TABS: 10.0000 mg | ORAL_TABLET | Freq: Three times a day (TID) | ORAL | 11 refills | Status: DC | PRN

## 2020-05-01 NOTE — Progress Notes (Signed)
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  Doesn't want more surgery- so will settle for shot.  Wanted to redo 2015 L hip- but since still bothering him, would rather the injection.   Celestone injections worked great- lasted until ~ 2 weeks ago- and was last done 10/25/2019.  Doing well otherwise.   Would like refills on Diclofenac and Flexeril.     Plan:  1. Can take Diclofenac 75 mg daily- as needed for inflammation- don't take Nambutone at the same time, FYI sent in 11 RFs (so 1 years supply)- can take both, but NOT ON SAME DAY!. 2. Refilled Flexeril 10 mg up to 3x/day as needed for muscle spasms- sent in 11 RFs  3. steroid injection was performed using 1% plain Lidocaine and celestone injection on L trochanteric bursa.  This was well tolerated.  Cleaned with betadine x3 and allowed to dry- then alcohol then injected using 27 gauge 1.5 inch needle- no bleeding or complications.    F/U in 3 months for steroid injections of L trochanteric bursa Celestone injection  Lidocaine will kick in 15 minutes- and wear off tonight- the steroid will kick in tomorrow within 24 hours and take up to 72 hours to fully kick in.  4. F/U 6 months for celestone injection   I spent a total of 25 minutes on appointment- as detailed above.

## 2020-05-01 NOTE — Patient Instructions (Signed)
Plan:  1. Can take Diclofenac 75 mg daily- as needed for inflammation- don't take Nambutone at the same time, FYI sent in 11 RFs (so 1 years supply)- can take both, but NOT ON SAME DAY!. 2. Refilled Flexeril 10 mg up to 3x/day as needed for muscle spasms- sent in 11 RFs  3. steroid injection was performed using 1% plain Lidocaine and celestone injection on L trochanteric bursa.  This was well tolerated.  Cleaned with betadine x3 and allowed to dry- then alcohol then injected using 27 gauge 1.5 inch needle- no bleeding or complications.    F/U in 3 months for steroid injections of L trochanteric bursa Celestone injection  Lidocaine will kick in 15 minutes- and wear off tonight- the steroid will kick in tomorrow within 24 hours and take up to 72 hours to fully kick in.  4. F/U 6 months for celestone injection

## 2020-05-06 ENCOUNTER — Other Ambulatory Visit: Payer: Self-pay | Admitting: Orthopaedic Surgery

## 2020-05-17 IMAGING — CT CT HEAD W/O CM
3 of 4 series · 14 of 47 positions shown, 16 images · non-contrast
Comparison: None.

CLINICAL DATA: Multiple syncopal episodes recently

EXAM:
CT HEAD WITHOUT CONTRAST
TECHNIQUE: Contiguous axial images were obtained from the base of the skull
through the vertex without intravenous contrast.

[Series 4: head 2.0 h70h · axial · 0.42mm/px · z∈[-110,+28]mm · 8 of 87 slices shown, 10 images]
[im 9/87  brain]
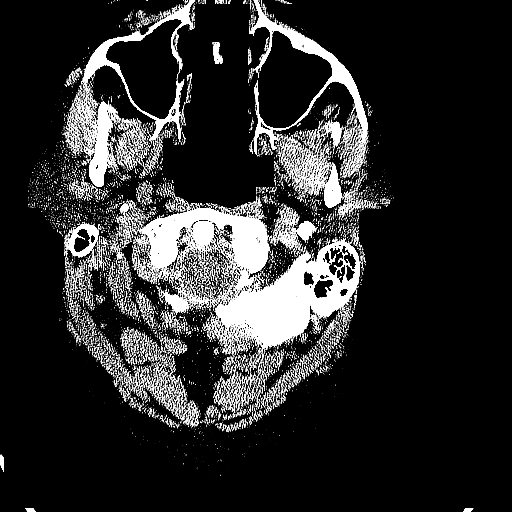
[im 9/87  bone]
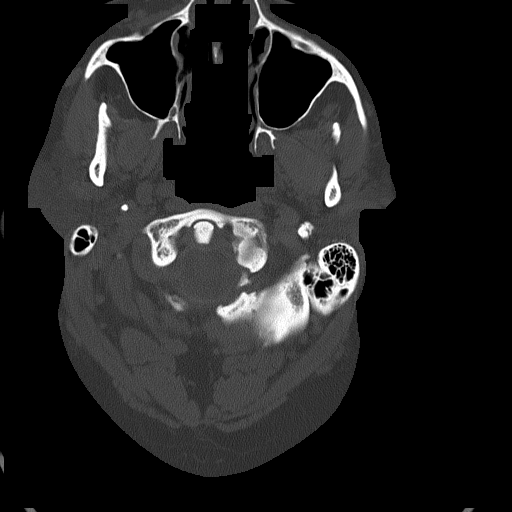
[im 17/87  brain]
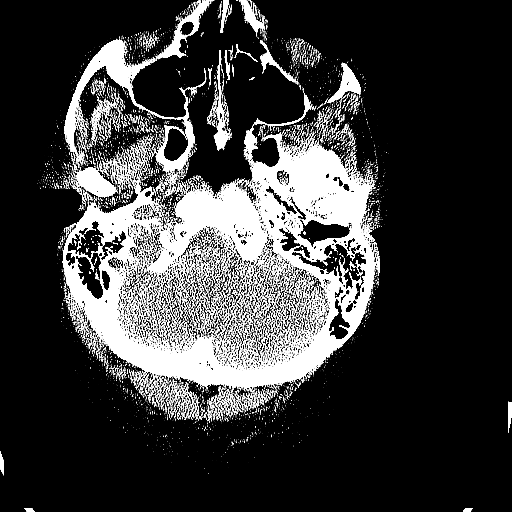
[im 29/87  brain]
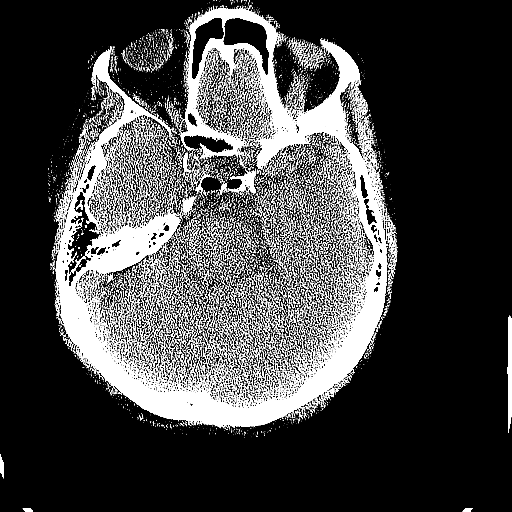
[im 37/87  brain]
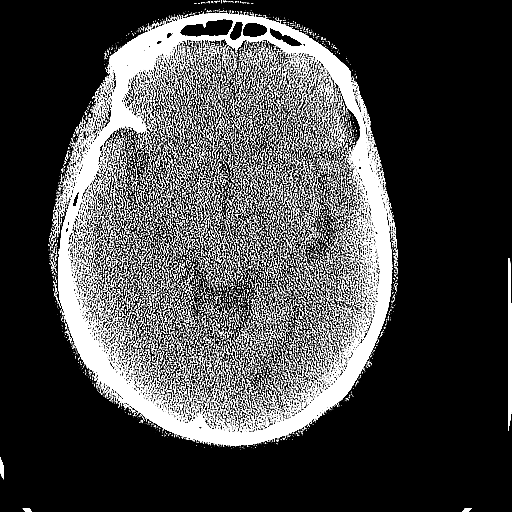
[im 50/87  brain]
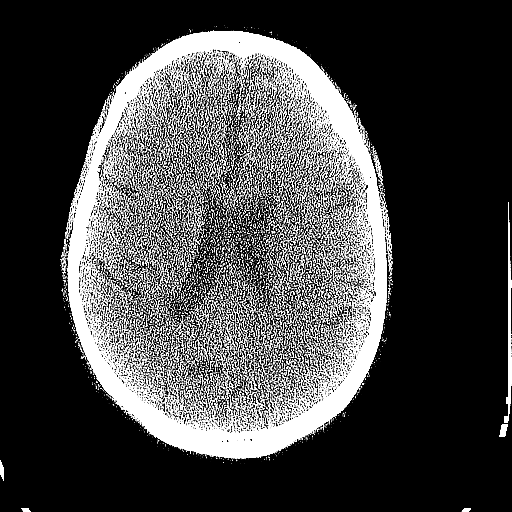
[im 50/87  bone]
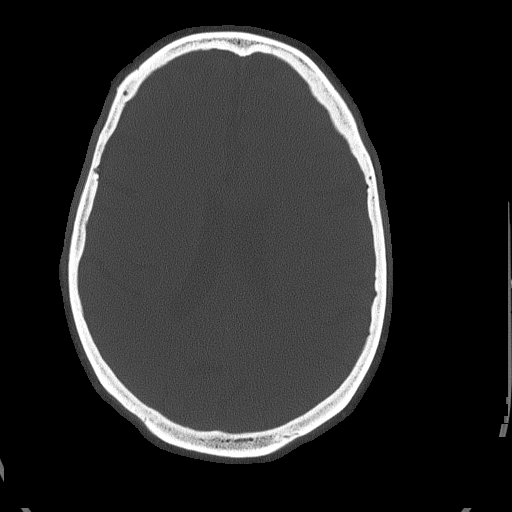
[im 58/87  brain]
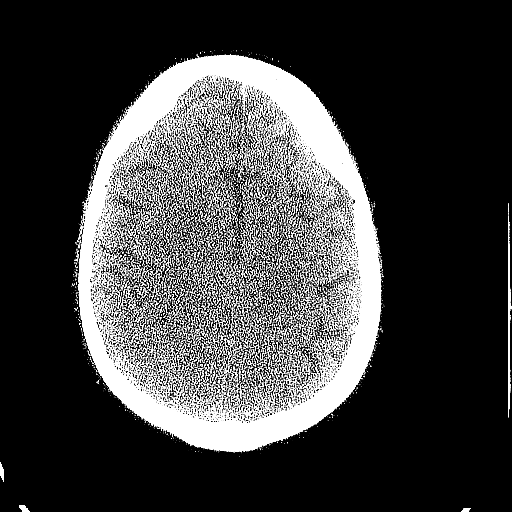
[im 70/87  brain]
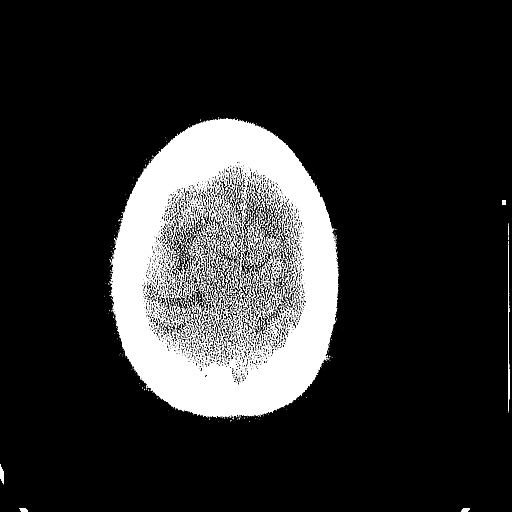
[im 78/87  brain]
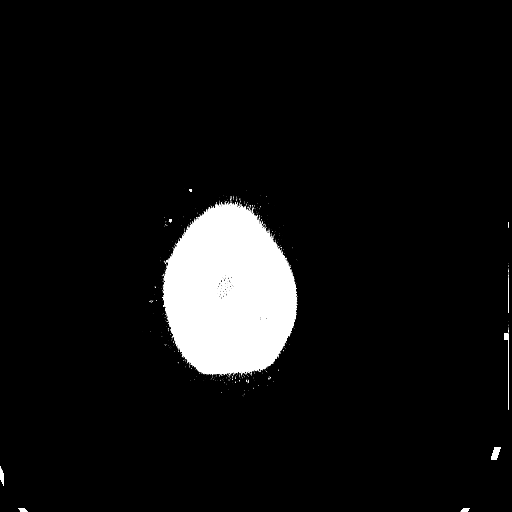

[Series 5: head 3.0 mpr cor · coronal · 0.34mm/px · 3 of 72 slices shown]
[im 24/72  brain]
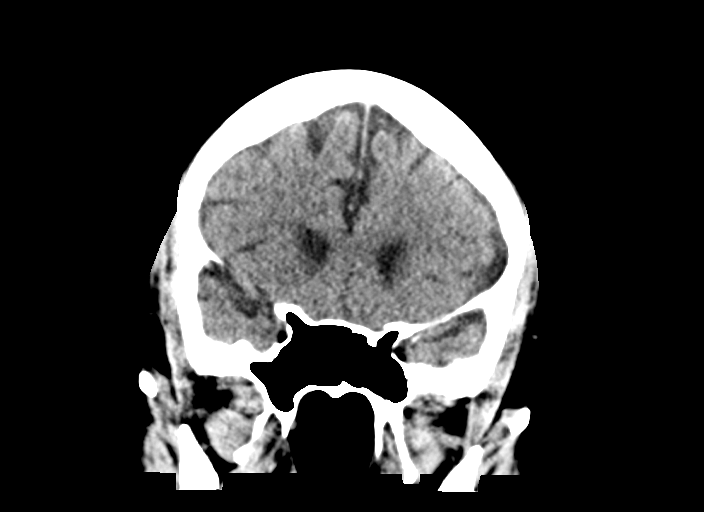
[im 32/72  brain]
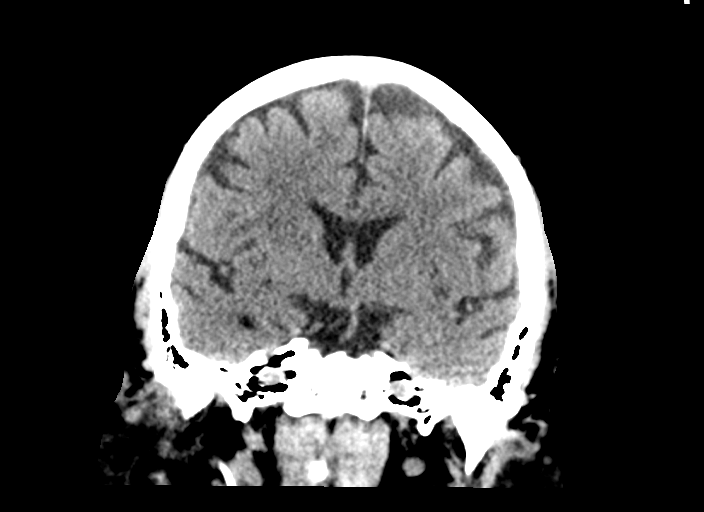
[im 40/72  brain]
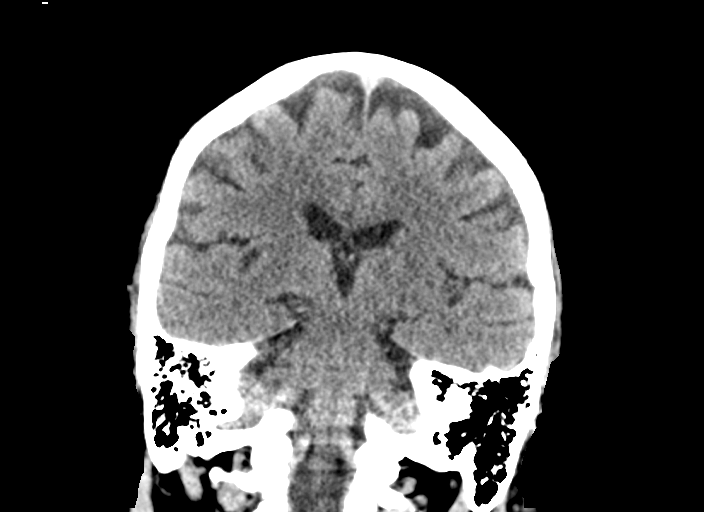

[Series 6: head 3.0 mpr sag · sagittal · 0.34mm/px · 3 of 66 slices shown]
[im 22/66  brain]
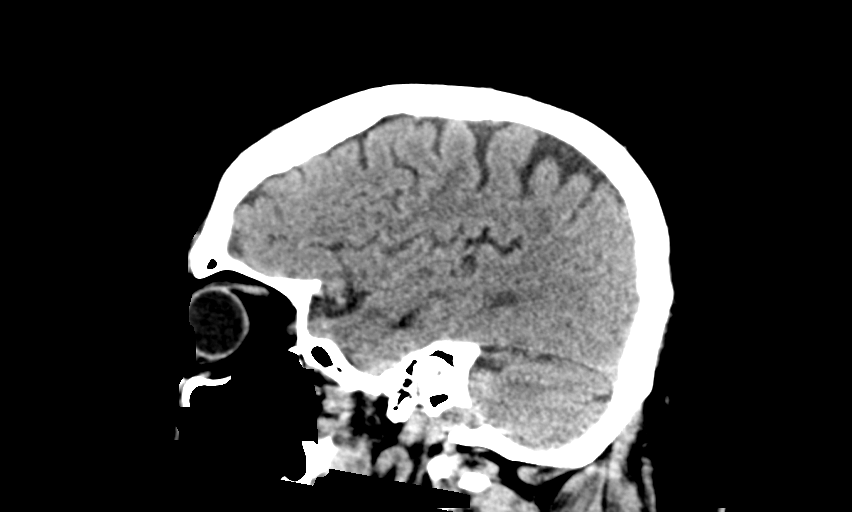
[im 33/66  brain]
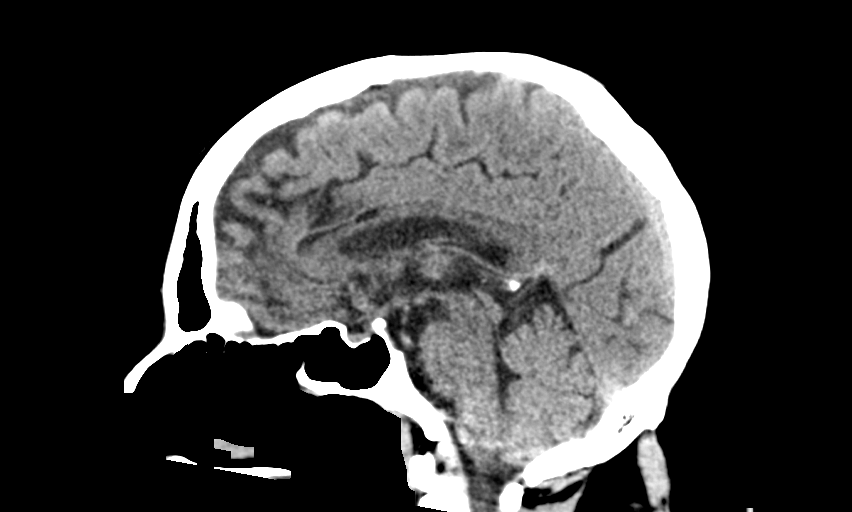
[im 44/66  brain]
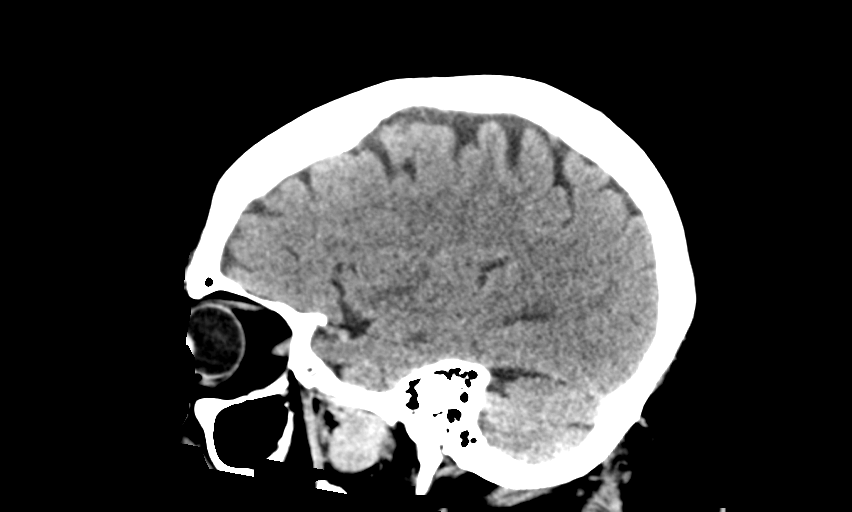

[14 of 47 positions shown; findings below may reference images not displayed]

FINDINGS: Brain: No evidence of acute infarction, hemorrhage, hydrocephalus,
extra-axial collection or mass lesion/mass effect. Chronic atrophic
and ischemic changes are noted.

Vascular: No hyperdense vessel or unexpected calcification.

Skull: Normal. Negative for fracture or focal lesion.

Sinuses/Orbits: No acute finding.

Other: None.
IMPRESSION: Chronic atrophic and ischemic changes.  No acute abnormality noted.

## 2020-10-17 DIAGNOSIS — I10 Essential (primary) hypertension: Secondary | ICD-10-CM | POA: Diagnosis not present

## 2020-10-17 DIAGNOSIS — E7800 Pure hypercholesterolemia, unspecified: Secondary | ICD-10-CM | POA: Diagnosis not present

## 2020-10-30 DIAGNOSIS — Z1389 Encounter for screening for other disorder: Secondary | ICD-10-CM | POA: Diagnosis not present

## 2020-10-30 DIAGNOSIS — Z0000 Encounter for general adult medical examination without abnormal findings: Secondary | ICD-10-CM | POA: Diagnosis not present

## 2021-03-10 DIAGNOSIS — Z23 Encounter for immunization: Secondary | ICD-10-CM | POA: Diagnosis not present

## 2021-05-19 ENCOUNTER — Other Ambulatory Visit: Payer: Self-pay | Admitting: Physical Medicine and Rehabilitation

## 2021-05-19 DIAGNOSIS — M7061 Trochanteric bursitis, right hip: Secondary | ICD-10-CM

## 2021-06-16 ENCOUNTER — Encounter: Payer: Self-pay | Admitting: Physical Medicine and Rehabilitation

## 2021-06-16 ENCOUNTER — Other Ambulatory Visit: Payer: Self-pay

## 2021-06-16 ENCOUNTER — Encounter: Payer: Medicare Other | Admitting: Physical Medicine and Rehabilitation

## 2021-06-16 ENCOUNTER — Encounter
Payer: Medicare Other | Attending: Physical Medicine and Rehabilitation | Admitting: Physical Medicine and Rehabilitation

## 2021-06-16 DIAGNOSIS — M7061 Trochanteric bursitis, right hip: Secondary | ICD-10-CM | POA: Diagnosis not present

## 2021-06-16 DIAGNOSIS — G894 Chronic pain syndrome: Secondary | ICD-10-CM | POA: Diagnosis not present

## 2021-06-16 DIAGNOSIS — M7062 Trochanteric bursitis, left hip: Secondary | ICD-10-CM | POA: Insufficient documentation

## 2021-06-16 MED ORDER — CYCLOBENZAPRINE HCL 10 MG PO TABS: 10.0000 mg | ORAL_TABLET | Freq: Three times a day (TID) | ORAL | 11 refills | Status: DC | PRN

## 2021-06-16 MED ORDER — DICLOFENAC SODIUM 75 MG PO TBEC: 75.0000 mg | DELAYED_RELEASE_TABLET | Freq: Every day | ORAL | 11 refills | Status: DC | PRN

## 2021-06-16 NOTE — Patient Instructions (Addendum)
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 Here for f/u on trochanteric bursae.  Has lost 30 lbs since last seen him!  Renewed Flexeril 10 mg 3x/day as needed- #90 11 refills  2. Diclofenac 75 mg daily as needed- #30- with 11 refills.    3. Will wait on L hip injection for now- but schedule something in ~ 3 months if still needs it.   4. Use pillow between knees when sleeps to take pressure/pull of L hip.  Will reduce pain of L hip.   5. F/U in 3 months for L hip celestone injection and f/u on L hip pain .  6.. Pick a couple of these exercises below to do 3x/week.  No more than 10 minutes at a time.     1.Clamshells:  Lie on your side with your feet together and  knees bent. While keeping your feet together,  lift the top knee. Repeat 15 times, 2 sets.  2.Gluteal stretch:  Lie on your back with your knees bent and  feet on the bed or floor. Place one ankle on  the opposite thigh. Pull your knee towards  the opposite shoulder. The stretch should  be felt on the back and lateral side of your  hip. Hold for 30 seconds, 2 times. 3.Hip flexor stretch:  a.Standing: Stand with a bent knee on a chair and the  standing leg in front. Tighten your buttocks to  move your hips forward. The stretch should be  on the front of your hip and/or thigh. Hold 30  seconds, 2 times. b.Thomas position: Sit on the side of a bed holding one leg to your  chest. Lie down. Press the heel of the leg off the  bed down and pull it back. You should feel the  stretch on the front of your hip and/or thigh.  Hold for 30 seconds, 2 times.  4.Single leg stance:  Stand in a corner without furniture or in front of  a sink in case you lose balance. Place your  hands on your hips. Keep your hips level and  your trunk upright. Aim to hold for 30 seconds  without resting. Practice for a total of 2 minutes.  Iliotibial Band Strengthening Exercises Syndrome   5.Standing hip  motions:  Practice standing on each leg. Begin  with no band and progress to using a  stretchable band. With each position, keep  your hips level and your trunk upright.  Repeat 10 times in each direction, 2 sets. a.Flexion:  Standing upright, move one leg forward. b.Abduction: Standing upright, move one leg to the side. c.Extension:  Standing upright, move one leg back. 6.Windmill:  Begin the exercise by standing upright  on one leg that is slightly bent. Bend  your trunk forward as you bring the  other leg back. Complete the exercise  by touching an object on the floor.  Repeat 10 times, 2 sets.    Foam roll massage options Side of thigh:  Lay on foam roll on your injured side so that the roll sits under the side of the leg just below the hip joint.  Support the weight of your body on your hands and opposite leg which is crossed out in front to help you  balance. Roll back and forth from below the hip until just above the knee.  NOTE: If your IT band is really tight this may be a painful exercise. You can adjust the  amount of pressure  you apply to make this more tolerable by helping to support the weight of your body with your uninjured  leg. As your IT band becomes looser the exercise will become less uncomfortable.  Gluteal muscles:  Lay on foam roll on your injured side so that the roll is under you

## 2021-06-16 NOTE — Progress Notes (Signed)
Subjective:    Patient ID: Lance Rollins, male    DOB: [birth year], 75 y.o.   MRN: [id number]  HPI Patient is a 75 yr old male with B/L trochanteric bursitis s/p injections on L. Here for f/u on trochanteric bursae.    Hip pain still comes and goes. Pain still on lateral aspect of L >R hip Injections really helped.    Started working out again in May 2022- Goal 200 lbs-  Was 255 lbs when last saw me!  Ran out of Diclofenac and muscle spasms, so came back  Feels like would want to try the meds first and then if they don't work, come back for injection.    Injection lasted 6 months.   Pain Inventory Average Pain 9 Pain Right Now 10 My pain is aching  In the last 24 hours, has pain interfered with the following? General activity 8 Relation with others 10 Enjoyment of life 8 What TIME of day is your pain at its worst? evening Sleep (in general) Good  Pain is worse with: sitting Pain improves with:  no med Relief from Meds:  no med  Family History  Problem Relation Age of Onset   Diabetes Sister    Diabetes Mother    Breast cancer Mother    Colon cancer Neg Hx    Stomach cancer Neg Hx    Esophageal cancer Neg Hx    Pancreatic cancer Neg Hx    Social History   Socioeconomic History   Marital status: Married    Spouse name: Not on file   Number of children: 5   Years of education: Not on file   Highest education level: Not on file  Occupational History   Occupation: retired  Tobacco Use   Smoking status: Never   Smokeless tobacco: Never  Vaping Use   Vaping Use: Never used  Substance and Sexual Activity   Alcohol use: Yes    Comment: 08/14/2013 "might have a drink q other holiday"   Drug use: No   Sexual activity: Not Currently  Other Topics Concern   Not on file  Social History Narrative   Not on file   Social Determinants of Health   Financial Resource Strain: Not on file  Food Insecurity: Not on file  Transportation Needs: Not on file  Physical  Activity: Not on file  Stress: Not on file  Social Connections: Not on file   Past Surgical History:  Procedure Laterality Date   ESOPHAGOGASTRODUODENOSCOPY N/A 08/15/2013   Procedure: ESOPHAGOGASTRODUODENOSCOPY (EGD);  Surgeon: Milus Banister, MD;  Location: [place];  Service: Endoscopy;  Laterality: N/A;   INGUINAL HERNIA REPAIR Bilateral ~ 2004   ROBOT ASSISTED LAPAROSCOPIC RADICAL PROSTATECTOMY  08/2009   [name] 08/14/2009 (08/14/2013)   TOTAL HIP ARTHROPLASTY Left 11/14/2013   Procedure: LEFT TOTAL HIP ARTHROPLASTY ANTERIOR APPROACH;  Surgeon: Mcarthur Rossetti, MD;  Location: [place];  Service: Orthopedics;  Laterality: Left;   TOTAL HIP ARTHROPLASTY Right 04/23/2017   Procedure: RIGHT TOTAL HIP ARTHROPLASTY ANTERIOR APPROACH;  Surgeon: Mcarthur Rossetti, MD;  Location[facility];  Service: Orthopedics;  Laterality: Right;   Past Surgical History:  Procedure Laterality Date   ESOPHAGOGASTRODUODENOSCOPY N/A 08/15/2013   Procedure: ESOPHAGOGASTRODUODENOSCOPY (EGD);  Surgeon: Milus Banister, MD;  Location: [place];  Service: Endoscopy;  Laterality: N/A;   INGUINAL HERNIA REPAIR Bilateral ~ 2004   ROBOT ASSISTED LAPAROSCOPIC RADICAL PROSTATECTOMY  08/2009   [name] 08/14/2009 (08/14/2013)   TOTAL HIP ARTHROPLASTY Left 11/14/2013  Procedure: LEFT TOTAL HIP ARTHROPLASTY ANTERIOR APPROACH;  Surgeon: Mcarthur Rossetti, MD;  Location: [place];  Service: Orthopedics;  Laterality: Left;   TOTAL HIP ARTHROPLASTY Right 04/23/2017   Procedure: RIGHT TOTAL HIP ARTHROPLASTY ANTERIOR APPROACH;  Surgeon: Mcarthur Rossetti, MD;  Location[facility];  Service: Orthopedics;  Laterality: Right;   Past Medical History:  Diagnosis Date   Arthritis    Bleeding per rectum 08/2013   "black and bright red" (08/14/2013)   Hypercholesteremia    Hypertension    Obesity    Prostate cancer ([place]) 2011   Unilateral primary osteoarthritis, 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   HENT: Negative.    Eyes: Negative.   Respiratory: Negative.    Cardiovascular: Negative.   Gastrointestinal: Negative.   Endocrine: Negative.   Musculoskeletal: Negative.   Skin: Negative.   Allergic/Immunologic: Negative.   Neurological: Negative.   Hematological: Negative.   Psychiatric/Behavioral: Negative.    All other systems reviewed and are negative.     Objective:   Physical Exam Awake,alert, appropriate, sitting on table; smaller than last visit, NAD TTP over L >R trochanteric bursae Tight IT band       Assessment & Plan:   Patient is a 75 yr old male with B/L trochanteric bursitis s/p injections on L. Here for f/u on trochanteric bursae.  Has lost 30 lbs since last seen him!  Renewed Flexeril 10 mg 3x/day as needed- #90 11 refills  2. Diclofenac 75 mg daily as needed- #30- with 11 refills.    3. Will wait on L hip injection for now- but schedule something in ~ 3 months if still needs it.   4. Use pillow between knees when sleeps to take pressure/pull of L hip.  Will reduce pain of L hip.   5. F/U in 3 months for L hip celestone injection and f/u on L hip pain .6. Sent pt home with list of exercises to do 3x/week 10 minutes at a time.   I spent a total of 25 minutes on visit- demonstrated exercises.

## 2021-07-17 DIAGNOSIS — Z23 Encounter for immunization: Secondary | ICD-10-CM | POA: Diagnosis not present

## 2021-09-19 ENCOUNTER — Encounter
Payer: Medicare Other | Attending: Physical Medicine and Rehabilitation | Admitting: Physical Medicine and Rehabilitation

## 2021-09-19 ENCOUNTER — Other Ambulatory Visit: Payer: Self-pay

## 2021-09-19 ENCOUNTER — Encounter: Payer: Self-pay | Admitting: Physical Medicine and Rehabilitation

## 2021-09-19 VITALS — BP 134/80 | HR 84 | Temp 98.1°F | Ht 67.0 in | Wt 234.6 lb

## 2021-09-19 DIAGNOSIS — M7062 Trochanteric bursitis, left hip: Secondary | ICD-10-CM | POA: Insufficient documentation

## 2021-09-19 DIAGNOSIS — M7061 Trochanteric bursitis, right hip: Secondary | ICD-10-CM | POA: Insufficient documentation

## 2021-09-19 DIAGNOSIS — G894 Chronic pain syndrome: Secondary | ICD-10-CM | POA: Diagnosis not present

## 2021-09-19 MED ORDER — CYCLOBENZAPRINE HCL 10 MG PO TABS: 10.0000 mg | ORAL_TABLET | Freq: Three times a day (TID) | ORAL | 11 refills | Status: DC | PRN

## 2021-09-19 MED ORDER — DICLOFENAC SODIUM 75 MG PO TBEC: 75.0000 mg | DELAYED_RELEASE_TABLET | Freq: Every day | ORAL | 11 refills | Status: DC | PRN

## 2021-09-19 NOTE — Progress Notes (Signed)
Subjective:    Patient ID: Lance Rollins, male    DOB: [birth year], 76 y.o.   MRN: [id number]  HPI  Patient is a 76 yr old male with B/L trochanteric bursitis s/p injections on L. Here for f/u on trochanteric bursae.  Has lost 25 lbs since 2021-  Here for f/u on trochanteric bursitis.   Things going great- doing exercises every day!! Goes to gym daily- hasn't missed a day since last saw me-  Can't stop- even before church.    Takes diclofenac 1x/day and Flexeril 1x/day-   SLeeping with pillows between legs- really makes a difference  Pain Inventory Average Pain 3 Pain Right Now 1 My pain is  much better now  In the last 24 hours, has pain interfered with the following? General activity 3 Relation with others 3 Enjoyment of life 5 What TIME of day is your pain at its worst? night Sleep (in general) Good  Pain is worse with: sitting Pain improves with:  no pain Relief from Meds: 76  Family History  Problem Relation Age of Onset   Diabetes Sister    Diabetes Mother    Breast cancer Mother    Colon cancer Neg Hx    Stomach cancer Neg Hx    Esophageal cancer Neg Hx    Pancreatic cancer Neg Hx    Social History   Socioeconomic History   Marital status: Married    Spouse name: Not on file   Number of children: 5   Years of education: Not on file   Highest education level: Not on file  Occupational History   Occupation: retired  Tobacco Use   Smoking status: Never   Smokeless tobacco: Never  Vaping Use   Vaping Use: Never used  Substance and Sexual Activity   Alcohol use: Yes    Comment: 08/14/2013 "might have a drink q other holiday"   Drug use: No   Sexual activity: Not Currently  Other Topics Concern   Not on file  Social History Narrative   Not on file   Social Determinants of Health   Financial Resource Strain: Not on file  Food Insecurity: Not on file  Transportation Needs: Not on file  Physical Activity: Not on file  Stress: Not on file   Social Connections: Not on file   Past Surgical History:  Procedure Laterality Date   ESOPHAGOGASTRODUODENOSCOPY N/A 08/15/2013   Procedure: ESOPHAGOGASTRODUODENOSCOPY (EGD);  Surgeon: Milus Banister, MD;  Location: [place];  Service: Endoscopy;  Laterality: N/A;   INGUINAL HERNIA REPAIR Bilateral ~ 2004   ROBOT ASSISTED LAPAROSCOPIC RADICAL PROSTATECTOMY  08/2009   [name] 08/14/2009 (08/14/2013)   TOTAL HIP ARTHROPLASTY Left 11/14/2013   Procedure: LEFT TOTAL HIP ARTHROPLASTY ANTERIOR APPROACH;  Surgeon: Mcarthur Rossetti, MD;  Location: [place];  Service: Orthopedics;  Laterality: Left;   TOTAL HIP ARTHROPLASTY Right 04/23/2017   Procedure: RIGHT TOTAL HIP ARTHROPLASTY ANTERIOR APPROACH;  Surgeon: Mcarthur Rossetti, MD;  [facility];  Service: Orthopedics;  Laterality: Right;   Past Surgical History:  Procedure Laterality Date   ESOPHAGOGASTRODUODENOSCOPY N/A 08/15/2013   Procedure: ESOPHAGOGASTRODUODENOSCOPY (EGD);  Surgeon: Milus Banister, MD;  Location: [place];  Service: Endoscopy;  Laterality: N/A;   INGUINAL HERNIA REPAIR Bilateral ~ 2004   ROBOT ASSISTED LAPAROSCOPIC RADICAL PROSTATECTOMY  08/2009   [name] 08/14/2009 (08/14/2013)   TOTAL HIP ARTHROPLASTY Left 11/14/2013   Procedure: LEFT TOTAL HIP ARTHROPLASTY ANTERIOR APPROACH;  Surgeon: Mcarthur Rossetti, MD;  Location: [facility];  Service: Orthopedics;  Laterality: Left;   TOTAL HIP ARTHROPLASTY Right 04/23/2017   Procedure: RIGHT TOTAL HIP ARTHROPLASTY ANTERIOR APPROACH;  Surgeon: Mcarthur Rossetti, MD;  [facility];  Service: Orthopedics;  Laterality: Right;   Past Medical History:  Diagnosis Date   Arthritis    Bleeding per rectum 08/2013   "black and bright red" (08/14/2013)   Hypercholesteremia    Hypertension    Obesity    Prostate cancer ([place]) 2011   Unilateral primary osteoarthritis, right hip 04/23/2017   BP 134/80    Pulse 84    Temp 98.1 F (36.7 C)    Ht 5\' 7"  (1.702 m)    Wt 234 lb 9.6 oz  (106.4 kg)    SpO2 95%    BMI 36.74 kg/m   Opioid Risk Score:   Fall Risk Score:  `1  Depression screen PHQ 2/9  Depression screen [facility] 2/9 09/19/2021 06/16/2021 06/19/2019  Decreased Interest 0 0 0  Down, Depressed, Hopeless 0 0 0  PHQ - 2 Score 0 0 0     Review of Systems  Constitutional: Negative.   HENT: Negative.    Eyes: Negative.   Respiratory: Negative.    Cardiovascular: Negative.   Gastrointestinal: Negative.   Endocrine: Negative.   Genitourinary: Negative.   Musculoskeletal: Negative.   Skin: Negative.   Allergic/Immunologic: Negative.   Neurological: Negative.   Hematological: Negative.   Psychiatric/Behavioral: Negative.    All other systems reviewed and are negative.     Objective:   Physical Exam   Awake, alert, appropriate, NAD Looks great Not TTP over trochanteric bursae B/L      Assessment & Plan:   Patient is a 76 yr old male with B/L trochanteric bursitis s/p injections on L. Here for f/u on trochanteric bursae.  Has lost 25 lbs since 2021-  Here for f/u on trochanteric bursitis.   Sees PCP soon- have the check your kidney function- just to make sure we can continue with Diclofenac daily.   2. Con't Diclofenac daily and Flexeril 10 mg daily- for pain.  Refilled for 1 year- just in case  3. Call and leave message if needs to be seen earlier.    4. F/U in 6 months.

## 2021-09-19 NOTE — Patient Instructions (Signed)
Patient is a 76 yr old male with B/L trochanteric bursitis s/p injections on L. Here for f/u on trochanteric bursae.  Has lost 25 lbs since 2021-  Here for f/u on trochanteric bursitis.   Sees PCP soon- have the check your kidney function- just to make sure we can continue with Diclofenac daily.   2. Con't Diclofenac daily and Flexeril 10 mg daily- for pain.  Refilled for 1 year- just in case  3. Call and leave message if needs to be seen earlier.    4. F/U in 6 months.

## 2021-12-17 DIAGNOSIS — E7800 Pure hypercholesterolemia, unspecified: Secondary | ICD-10-CM | POA: Diagnosis not present

## 2021-12-17 DIAGNOSIS — Z0000 Encounter for general adult medical examination without abnormal findings: Secondary | ICD-10-CM | POA: Diagnosis not present

## 2021-12-17 DIAGNOSIS — I10 Essential (primary) hypertension: Secondary | ICD-10-CM | POA: Diagnosis not present

## 2022-03-20 ENCOUNTER — Encounter
Payer: Medicare Other | Attending: Physical Medicine and Rehabilitation | Admitting: Physical Medicine and Rehabilitation

## 2022-03-20 ENCOUNTER — Encounter: Payer: Self-pay | Admitting: Physical Medicine and Rehabilitation

## 2022-03-20 VITALS — BP 125/76 | HR 82 | Ht 67.0 in | Wt 228.0 lb

## 2022-03-20 DIAGNOSIS — G894 Chronic pain syndrome: Secondary | ICD-10-CM | POA: Diagnosis not present

## 2022-03-20 DIAGNOSIS — M7061 Trochanteric bursitis, right hip: Secondary | ICD-10-CM | POA: Diagnosis not present

## 2022-03-20 DIAGNOSIS — M7062 Trochanteric bursitis, left hip: Secondary | ICD-10-CM | POA: Diagnosis not present

## 2022-03-20 MED ORDER — DICLOFENAC SODIUM 75 MG PO TBEC: 75.0000 mg | DELAYED_RELEASE_TABLET | Freq: Every day | ORAL | 11 refills | Status: DC | PRN

## 2022-03-20 MED ORDER — CYCLOBENZAPRINE HCL 10 MG PO TABS: 10.0000 mg | ORAL_TABLET | Freq: Three times a day (TID) | ORAL | 11 refills | Status: DC | PRN

## 2022-03-20 NOTE — Patient Instructions (Signed)
Patient is a 76 yr old male with B/L trochanteric bursitis s/p injections on L. Here for f/u on trochanteric bursae.  Has lost 25 lbs since 2021-  Here for f/u on trochanteric bursitis.  Wants to wait on steroid taper for now. Wants to see if can get it under control.   2.  For 5 days, to take the Diclofenac 2x/day- then go back to 1x/day.   3. Can also increase Flexeril/cyclobenzaprine to 1/2 tab in Am and 1 tab at night for the next 5 days, then go back to daily.   4. Call me if you want me to do steroid taper   5. F/u in 6 months- call if need me prior.

## 2022-03-20 NOTE — Progress Notes (Signed)
Subjective:    Patient ID: Lance Rollins, male    DOB: 10/28/1945, 76 y.o.   MRN: [id number]  HPI Patient is a 76 yr old male with B/L trochanteric bursitis s/p injections on L. Here for f/u on trochanteric bursae.  Has lost 25 lbs since 2021-  Here for f/u on trochanteric bursitis.   Taking both the diclofenac and flexeril 10 mg daily right now.   Started acting up the middle of July-  Still exercising 7 days/week.  If doesn't go to the gym; it hurts worse.  Pain better when does work out.   Sore from shingles shot yesterday- 2nd one.   Good otherwise.     Pain Inventory Average Pain 9 Pain Right Now 9 My pain is intermittent and aching  In the last 24 hours, has pain interfered with the following? General activity 8 Relation with others 9 Enjoyment of life 9 What TIME of day is your pain at its worst? morning  Sleep (in general) Good  Pain is worse with: walking Pain improves with: therapy/exercise and medication Relief from Meds:  good      Family History  Problem Relation Age of Onset   Diabetes Sister    Diabetes Mother    Breast cancer Mother    Colon cancer Neg Hx    Stomach cancer Neg Hx    Esophageal cancer Neg Hx    Pancreatic cancer Neg Hx    Social History   Socioeconomic History   Marital status: Married    Spouse name: Not on file   Number of children: 5   Years of education: Not on file   Highest education level: Not on file  Occupational History   Occupation: retired  Tobacco Use   Smoking status: Never   Smokeless tobacco: Never  Vaping Use   Vaping Use: Never used  Substance and Sexual Activity   Alcohol use: Yes    Comment: 08/14/2013 "might have a drink q other holiday"   Drug use: No   Sexual activity: Not Currently  Other Topics Concern   Not on file  Social History Narrative   Not on file   Social Determinants of Health   Financial Resource Strain: Not on file  Food Insecurity: Not on file  Transportation Needs:  Not on file  Physical Activity: Not on file  Stress: Not on file  Social Connections: Not on file   Past Surgical History:  Procedure Laterality Date   ESOPHAGOGASTRODUODENOSCOPY N/A 08/15/2013   Procedure: ESOPHAGOGASTRODUODENOSCOPY (EGD);  Surgeon: Lance Banister, MD;  [facility]: [facility];  Service: Endoscopy;  Laterality: N/A;   INGUINAL HERNIA REPAIR Bilateral ~ 2004   ROBOT ASSISTED LAPAROSCOPIC RADICAL PROSTATECTOMY  08/2009   Lance Rollins 08/14/2009 (08/14/2013)   TOTAL HIP ARTHROPLASTY Left 11/14/2013   Procedure: LEFT TOTAL HIP ARTHROPLASTY ANTERIOR APPROACH;  Surgeon: Lance Rossetti, MD;  Location: [place];  Service: Orthopedics;  Laterality: Left;   TOTAL HIP ARTHROPLASTY Right 04/23/2017   Procedure: RIGHT TOTAL HIP ARTHROPLASTY ANTERIOR APPROACH;  Surgeon: Lance Rossetti, MD;  [facility];  Service: Orthopedics;  Laterality: Right;   Past Medical History:  Diagnosis Date   Arthritis    Bleeding per rectum 08/2013   "black and bright red" (08/14/2013)   Hypercholesteremia    Hypertension    Obesity    Prostate cancer ([place]) 2011   Unilateral primary osteoarthritis, right hip 04/23/2017   Ht '5\' 7"'$  (1.702 m)   Wt 228 lb (103.4 kg)  BMI 35.71 kg/m   Opioid Risk Score:   Fall Risk Score:  `1  Depression screen [facility] 2/9     03/20/2022    2:46 PM 09/19/2021    2:36 PM 06/16/2021    1:59 PM 06/19/2019   10:06 AM  Depression screen PHQ 2/9  Decreased Interest 0 0 0 0  Down, Depressed, Hopeless  0 0 0  PHQ - 2 Score 0 0 0 0    Review of Systems  Musculoskeletal:        Pain in left hip  All other systems reviewed and are negative.      Objective:   Physical Exam  Awake, alert, appropriate, sitting on table NAD TTP over L>R trochanteric bursae      Assessment & Plan:   Patient is a 76 yr old male with B/L trochanteric bursitis s/p injections on L. Here for f/u on trochanteric bursae.  Has lost 25 lbs since 2021-  Here for f/u on trochanteric  bursitis.  Wants to wait on steroid taper for now. Wants to see if can get it under control.   2.  For 5 days, to take the Diclofenac 2x/day- then go back to 1x/day.   3. Can also increase Flexeril/cyclobenzaprine to 1/2 tab in Am and 1 tab at night for the next 5 days, then go back to daily.   4. Call me if you want me to do steroid taper   5. F/u in 6 months- call if need me prior.

## 2022-05-19 DIAGNOSIS — H40023 Open angle with borderline findings, high risk, bilateral: Secondary | ICD-10-CM | POA: Diagnosis not present

## 2022-05-19 DIAGNOSIS — H0288B Meibomian gland dysfunction left eye, upper and lower eyelids: Secondary | ICD-10-CM | POA: Diagnosis not present

## 2022-05-19 DIAGNOSIS — H0288A Meibomian gland dysfunction right eye, upper and lower eyelids: Secondary | ICD-10-CM | POA: Diagnosis not present

## 2022-05-19 DIAGNOSIS — H25813 Combined forms of age-related cataract, bilateral: Secondary | ICD-10-CM | POA: Diagnosis not present

## 2022-05-21 DIAGNOSIS — S39012A Strain of muscle, fascia and tendon of lower back, initial encounter: Secondary | ICD-10-CM | POA: Diagnosis not present

## 2022-07-16 DIAGNOSIS — Z23 Encounter for immunization: Secondary | ICD-10-CM | POA: Diagnosis not present

## 2022-09-21 ENCOUNTER — Encounter
Payer: Medicare Other | Attending: Physical Medicine and Rehabilitation | Admitting: Physical Medicine and Rehabilitation

## 2022-09-21 ENCOUNTER — Encounter: Payer: Self-pay | Admitting: Physical Medicine and Rehabilitation

## 2022-09-21 VITALS — BP 154/73 | HR 100 | Ht 67.0 in | Wt 226.2 lb

## 2022-09-21 DIAGNOSIS — M7061 Trochanteric bursitis, right hip: Secondary | ICD-10-CM | POA: Diagnosis not present

## 2022-09-21 DIAGNOSIS — G894 Chronic pain syndrome: Secondary | ICD-10-CM | POA: Insufficient documentation

## 2022-09-21 DIAGNOSIS — M7062 Trochanteric bursitis, left hip: Secondary | ICD-10-CM | POA: Diagnosis not present

## 2022-09-21 MED ORDER — CYCLOBENZAPRINE HCL 10 MG PO TABS: 10.0000 mg | ORAL_TABLET | Freq: Three times a day (TID) | ORAL | 11 refills | Status: DC | PRN

## 2022-09-21 MED ORDER — DICLOFENAC SODIUM 75 MG PO TBEC: 75.0000 mg | DELAYED_RELEASE_TABLET | Freq: Every day | ORAL | 11 refills | Status: DC | PRN

## 2022-09-21 NOTE — Progress Notes (Signed)
Subjective:    Patient ID: Lance Rollins, male    DOB: February 03, 1946, 77 y.o.   MRN: [id number]  HPI Patient is a 77 yr old male with B/L trochanteric bursitis s/p injections on L. Here for f/u on trochanteric bursae.  Has lost 25 lbs since 2021-  Here for f/u on B/L trochanteric bursitis.  Having pain in front of R thigh- was L thigh, and now R thigh.  2x/week- when occurs 10/10.   Works out 7 days/week- 30 minutes on bike, treadmill 30 minutes and then weights.  Feels a whole lot better.   Right now pain "good" just sitting here.  Mostly has anterior thigh occurs in evenings.  If doesn't keep moving, can set it off.   Takes the Flexeril when has the anterior thigh pain- helps a lot when takes it. Actually takes flexeril 2x/day.  Doesn't make him sleepy.   Also taking Diclofenac 1x/day-      Pain Inventory Average Pain 10 Pain Right Now 9 My pain is aching  In the last 24 hours, has pain interfered with the following? General activity 0 Relation with others 0 Enjoyment of life 0 What TIME of day is your pain at its worst? evening Sleep (in general) Fair  Pain is worse with: some activites Pain improves with: rest, heat/ice, therapy/exercise, and medication Relief from Meds: 8  Family History  Problem Relation Age of Onset   Diabetes Sister    Diabetes Mother    Breast cancer Mother    Colon cancer Neg Hx    Stomach cancer Neg Hx    Esophageal cancer Neg Hx    Pancreatic cancer Neg Hx    Social History   Socioeconomic History   Marital status: Married    Spouse name: Not on file   Number of children: 5   Years of education: Not on file   Highest education level: Not on file  Occupational History   Occupation: retired  Tobacco Use   Smoking status: Never   Smokeless tobacco: Never  Vaping Use   Vaping Use: Never used  Substance and Sexual Activity   Alcohol use: Yes    Comment: 08/14/2013 "might have a drink q other holiday"   Drug use: No   Sexual  activity: Not Currently  Other Topics Concern   Not on file  Social History Narrative   Not on file   Social Determinants of Health   Financial Resource Strain: Not on file  Food Insecurity: Not on file  Transportation Needs: Not on file  Physical Activity: Not on file  Stress: Not on file  Social Connections: Not on file   Past Surgical History:  Procedure Laterality Date   ESOPHAGOGASTRODUODENOSCOPY N/A 08/15/2013   Procedure: ESOPHAGOGASTRODUODENOSCOPY (EGD);  Surgeon: Milus Banister, MD;  Location: [place];  Service: Endoscopy;  Laterality: N/A;   INGUINAL HERNIA REPAIR Bilateral ~ 2004   ROBOT ASSISTED LAPAROSCOPIC RADICAL PROSTATECTOMY  08/2009   [name] 08/14/2009 (08/14/2013)   TOTAL HIP ARTHROPLASTY Left 11/14/2013   Procedure: LEFT TOTAL HIP ARTHROPLASTY ANTERIOR APPROACH;  Surgeon: Mcarthur Rossetti, MD;  Location: [place];  Service: Orthopedics;  Laterality: Left;   TOTAL HIP ARTHROPLASTY Right 04/23/2017   Procedure: RIGHT TOTAL HIP ARTHROPLASTY ANTERIOR APPROACH;  Surgeon: Mcarthur Rossetti, MD;  Location[facility];  Service: Orthopedics;  Laterality: Right;   Past Surgical History:  Procedure Laterality Date   ESOPHAGOGASTRODUODENOSCOPY N/A 08/15/2013   Procedure: ESOPHAGOGASTRODUODENOSCOPY (EGD);  Surgeon: Milus Banister, MD;  [facility];  Service: Endoscopy;  Laterality: N/A;   INGUINAL HERNIA REPAIR Bilateral ~ 2004   ROBOT ASSISTED LAPAROSCOPIC RADICAL PROSTATECTOMY  08/2009   [name] 08/14/2009 (08/14/2013)   TOTAL HIP ARTHROPLASTY Left 11/14/2013   Procedure: LEFT TOTAL HIP ARTHROPLASTY ANTERIOR APPROACH;  Surgeon: Mcarthur Rossetti, MD;  Location: [place];  Service: Orthopedics;  Laterality: Left;   TOTAL HIP ARTHROPLASTY Right 04/23/2017   Procedure: RIGHT TOTAL HIP ARTHROPLASTY ANTERIOR APPROACH;  Surgeon: Mcarthur Rossetti, MD;  [facility];  Service: Orthopedics;  Laterality: Right;   Past Medical History:  Diagnosis Date    Arthritis    Bleeding per rectum 08/2013   "black and bright red" (08/14/2013)   Hypercholesteremia    Hypertension    Obesity    Prostate cancer ([place]) 2011   Unilateral primary osteoarthritis, right hip 04/23/2017   BP (!) 154/73   Pulse 100   Ht 5' 7"$  (1.702 m)   Wt 226 lb 3.2 oz (102.6 kg)   SpO2 97%   BMI 35.43 kg/m   Opioid Risk Score:   Fall Risk Score:  `1  Depression screen [facility] 2/9     03/20/2022    2:46 PM 09/19/2021    2:36 PM 06/16/2021    1:59 PM 06/19/2019   10:06 AM  Depression screen PHQ 2/9  Decreased Interest 0 0 0 0  Down, Depressed, Hopeless  0 0 0  PHQ - 2 Score 0 0 0 0     Review of Systems     Objective:   Physical Exam  Awake, alert, appropriate, NAD TTP anterior thighs      Assessment & Plan:   Patient is a 77 yr old male with B/L trochanteric bursitis s/p injections on L. Here for f/u on trochanteric bursae.  Has lost 25 lbs since 2021-  Here for f/u on trochanteric bursitis.  Con't Diclofenac 75 mg daily # 11 refills  2. Con't Flexeril 10 mg 2x/day as needed # 60 -11 refills.   3. F/U 1 year, but call if needs me before then

## 2022-09-21 NOTE — Patient Instructions (Signed)
Patient is a 77 yr old male with B/L trochanteric bursitis s/p injections on L. Here for f/u on trochanteric bursae.  Has lost 25 lbs since 2021-  Here for f/u on trochanteric bursitis.  Con't Diclofenac 75 mg daily # 11 refills  2. Con't Flexeril 10 mg 2x/day as needed # 60 -11 refills.   3. F/U 1 year, but call if needs me before then

## 2022-09-29 ENCOUNTER — Emergency Department (HOSPITAL_COMMUNITY): Payer: Medicare Other

## 2022-09-29 ENCOUNTER — Inpatient Hospital Stay (HOSPITAL_COMMUNITY)
Admission: EM | Admit: 2022-09-29 | Discharge: 2022-10-02 | DRG: 379 | Disposition: A | Discharge status: Home / Self Care | Payer: Medicare Other | Attending: Internal Medicine | Admitting: Internal Medicine

## 2022-09-29 ENCOUNTER — Encounter (HOSPITAL_COMMUNITY): Payer: Self-pay

## 2022-09-29 ENCOUNTER — Other Ambulatory Visit: Payer: Self-pay

## 2022-09-29 DIAGNOSIS — D509 Iron deficiency anemia, unspecified: Secondary | ICD-10-CM | POA: Diagnosis present

## 2022-09-29 DIAGNOSIS — Z96643 Presence of artificial hip joint, bilateral: Secondary | ICD-10-CM | POA: Diagnosis present

## 2022-09-29 DIAGNOSIS — M1611 Unilateral primary osteoarthritis, right hip: Secondary | ICD-10-CM | POA: Diagnosis not present

## 2022-09-29 DIAGNOSIS — Z803 Family history of malignant neoplasm of breast: Secondary | ICD-10-CM | POA: Diagnosis not present

## 2022-09-29 DIAGNOSIS — Z833 Family history of diabetes mellitus: Secondary | ICD-10-CM

## 2022-09-29 DIAGNOSIS — I10 Essential (primary) hypertension: Secondary | ICD-10-CM | POA: Diagnosis present

## 2022-09-29 DIAGNOSIS — K5730 Diverticulosis of large intestine without perforation or abscess without bleeding: Secondary | ICD-10-CM | POA: Diagnosis present

## 2022-09-29 DIAGNOSIS — Z9079 Acquired absence of other genital organ(s): Secondary | ICD-10-CM | POA: Diagnosis not present

## 2022-09-29 DIAGNOSIS — Z8546 Personal history of malignant neoplasm of prostate: Secondary | ICD-10-CM | POA: Diagnosis not present

## 2022-09-29 DIAGNOSIS — Z6835 Body mass index (BMI) 35.0-35.9, adult: Secondary | ICD-10-CM

## 2022-09-29 DIAGNOSIS — K922 Gastrointestinal hemorrhage, unspecified: Secondary | ICD-10-CM | POA: Diagnosis not present

## 2022-09-29 DIAGNOSIS — D649 Anemia, unspecified: Secondary | ICD-10-CM

## 2022-09-29 DIAGNOSIS — K219 Gastro-esophageal reflux disease without esophagitis: Secondary | ICD-10-CM | POA: Diagnosis not present

## 2022-09-29 DIAGNOSIS — K648 Other hemorrhoids: Secondary | ICD-10-CM | POA: Diagnosis not present

## 2022-09-29 DIAGNOSIS — E669 Obesity, unspecified: Secondary | ICD-10-CM | POA: Diagnosis present

## 2022-09-29 DIAGNOSIS — M7062 Trochanteric bursitis, left hip: Secondary | ICD-10-CM | POA: Diagnosis present

## 2022-09-29 DIAGNOSIS — Z791 Long term (current) use of non-steroidal anti-inflammatories (NSAID): Secondary | ICD-10-CM

## 2022-09-29 DIAGNOSIS — M7061 Trochanteric bursitis, right hip: Secondary | ICD-10-CM | POA: Diagnosis not present

## 2022-09-29 DIAGNOSIS — K5731 Diverticulosis of large intestine without perforation or abscess with bleeding: Secondary | ICD-10-CM | POA: Diagnosis not present

## 2022-09-29 DIAGNOSIS — K3189 Other diseases of stomach and duodenum: Secondary | ICD-10-CM | POA: Diagnosis not present

## 2022-09-29 DIAGNOSIS — D72829 Elevated white blood cell count, unspecified: Secondary | ICD-10-CM | POA: Diagnosis present

## 2022-09-29 DIAGNOSIS — D75839 Thrombocytosis, unspecified: Secondary | ICD-10-CM | POA: Diagnosis present

## 2022-09-29 DIAGNOSIS — B9681 Helicobacter pylori [H. pylori] as the cause of diseases classified elsewhere: Secondary | ICD-10-CM | POA: Diagnosis present

## 2022-09-29 DIAGNOSIS — Z8711 Personal history of peptic ulcer disease: Secondary | ICD-10-CM | POA: Diagnosis not present

## 2022-09-29 DIAGNOSIS — Z79899 Other long term (current) drug therapy: Secondary | ICD-10-CM

## 2022-09-29 DIAGNOSIS — G894 Chronic pain syndrome: Secondary | ICD-10-CM | POA: Diagnosis present

## 2022-09-29 DIAGNOSIS — R55 Syncope and collapse: Principal | ICD-10-CM

## 2022-09-29 DIAGNOSIS — K2971 Gastritis, unspecified, with bleeding: Principal | ICD-10-CM | POA: Diagnosis present

## 2022-09-29 DIAGNOSIS — E7800 Pure hypercholesterolemia, unspecified: Secondary | ICD-10-CM | POA: Diagnosis present

## 2022-09-29 DIAGNOSIS — K2970 Gastritis, unspecified, without bleeding: Secondary | ICD-10-CM | POA: Diagnosis present

## 2022-09-29 DIAGNOSIS — R61 Generalized hyperhidrosis: Secondary | ICD-10-CM | POA: Diagnosis not present

## 2022-09-29 DIAGNOSIS — R0789 Other chest pain: Secondary | ICD-10-CM | POA: Diagnosis not present

## 2022-09-29 DIAGNOSIS — D500 Iron deficiency anemia secondary to blood loss (chronic): Secondary | ICD-10-CM | POA: Diagnosis not present

## 2022-09-29 DIAGNOSIS — M1990 Unspecified osteoarthritis, unspecified site: Secondary | ICD-10-CM | POA: Diagnosis not present

## 2022-09-29 LAB — COMPREHENSIVE METABOLIC PANEL
ALT: 21 U/L (ref 0–44)
AST: 22 U/L (ref 15–41)
Albumin: 3.4 g/dL — ABNORMAL LOW (ref 3.5–5.0)
Alkaline Phosphatase: 60 U/L (ref 38–126)
Anion gap: 11 (ref 5–15)
BUN: 18 mg/dL (ref 8–23)
CO2: 24 mmol/L (ref 22–32)
Calcium: 9 mg/dL (ref 8.9–10.3)
Chloride: 101 mmol/L (ref 98–111)
Creatinine, Ser: 1.16 mg/dL (ref 0.61–1.24)
GFR, Estimated: >60 mL/min (ref >60)
Glucose, Bld: 100 mg/dL — ABNORMAL HIGH (ref 70–99)
Potassium: 3.9 mmol/L (ref 3.5–5.1)
Sodium: 136 mmol/L (ref 135–145)
Total Bilirubin: 0.3 mg/dL (ref 0.3–1.2)
Total Protein: 7.4 g/dL (ref 6.5–8.1)

## 2022-09-29 LAB — CBC WITH DIFFERENTIAL/PLATELET
Abs Immature Granulocytes: 0.01 10*3/uL (ref 0.00–0.07)
Basophils Absolute: 0.1 10*3/uL (ref 0.0–0.1)
Basophils Relative: 1 %
Eosinophils Absolute: 0.3 10*3/uL (ref 0.0–0.5)
Eosinophils Relative: 4 %
HCT: 26.1 % — ABNORMAL LOW (ref 39.0–52.0)
Hemoglobin: 7.5 g/dL — ABNORMAL LOW (ref 13.0–17.0)
Immature Granulocytes: 0 %
Lymphocytes Relative: 35 %
Lymphs Abs: 2.6 10*3/uL (ref 0.7–4.0)
MCH: 20.5 pg — ABNORMAL LOW (ref 26.0–34.0)
MCHC: 28.7 g/dL — ABNORMAL LOW (ref 30.0–36.0)
MCV: 71.3 fL — ABNORMAL LOW (ref 80.0–100.0)
Monocytes Absolute: 0.8 10*3/uL (ref 0.1–1.0)
Monocytes Relative: 11 %
Neutro Abs: 3.7 10*3/uL (ref 1.7–7.7)
Neutrophils Relative %: 49 %
Platelets: 593 10*3/uL — ABNORMAL HIGH (ref 150–400)
RBC: 3.66 MIL/uL — ABNORMAL LOW (ref 4.22–5.81)
RDW: 19 % — ABNORMAL HIGH (ref 11.5–15.5)
WBC: 7.4 10*3/uL (ref 4.0–10.5)
nRBC: 0 % (ref 0.0–0.2)

## 2022-09-29 LAB — TROPONIN I (HIGH SENSITIVITY)
Troponin I (High Sensitivity): 4 ng/L (ref <18)
Troponin I (High Sensitivity): 4 ng/L (ref <18)

## 2022-09-29 LAB — CBG MONITORING, ED: Glucose-Capillary: 102 mg/dL — ABNORMAL HIGH (ref 70–99)

## 2022-09-29 NOTE — ED Triage Notes (Signed)
Was upstairs visiting with daughter and began feeling hot, sweaty and had a syncopal episode from standing position. Witnessed by wife. Denies head injury. Does admit to left sided chest discomfort.   She admits he has not had anything to eat today except a few fries, no water, and has been up since 4AM.   Multiple other occasions of syncopal events.

## 2022-09-29 NOTE — ED Provider Triage Note (Signed)
Emergency Medicine Provider Triage Evaluation Note  Eugene Shadowens , a 77 y.o. male  was evaluated in triage.  Pt complains of loss of consciousness.  He has had intermittent episodes of chest discomfort this week and lost consciousness while sitting and visiting his daughter upstairs who was admitted.  He has a history of GI bleed.  He denies any melena or hematochezia..  Review of Systems  Positive:  Loss of consciousness Negative: Fever  Physical Exam  There were no vitals taken for this visit. Gen:   Awake, no distress   Resp:  Normal effort  MSK:   Moves extremities without difficulty  Other:  Pale conjunctiva  Medical Decision Making  Medically screening exam initiated at 7:41 PM.  Appropriate orders placed.  Lacharles Dimitriou was informed that the remainder of the evaluation will be completed by another provider, this initial triage assessment does not replace that evaluation, and the importance of remaining in the ED until their evaluation is complete.  Workup initiated  Patient labs show hemoglobin of 7.5, down from 12.5.  Charge nurse notified   Margarita Mail, PA-C 09/29/22 2157

## 2022-09-30 ENCOUNTER — Encounter (HOSPITAL_COMMUNITY): Payer: Self-pay | Admitting: Internal Medicine

## 2022-09-30 ENCOUNTER — Observation Stay (HOSPITAL_BASED_OUTPATIENT_CLINIC_OR_DEPARTMENT_OTHER): Payer: Medicare Other | Admitting: Anesthesiology

## 2022-09-30 ENCOUNTER — Observation Stay (HOSPITAL_COMMUNITY): Payer: Medicare Other | Admitting: Anesthesiology

## 2022-09-30 ENCOUNTER — Encounter (HOSPITAL_COMMUNITY)
Admission: EM | Disposition: A | Discharge status: Home / Self Care | Payer: Self-pay | Source: Home / Self Care | Attending: Internal Medicine

## 2022-09-30 DIAGNOSIS — I10 Essential (primary) hypertension: Secondary | ICD-10-CM

## 2022-09-30 DIAGNOSIS — Z6835 Body mass index (BMI) 35.0-35.9, adult: Secondary | ICD-10-CM

## 2022-09-30 DIAGNOSIS — K2970 Gastritis, unspecified, without bleeding: Secondary | ICD-10-CM

## 2022-09-30 DIAGNOSIS — D649 Anemia, unspecified: Secondary | ICD-10-CM | POA: Diagnosis not present

## 2022-09-30 DIAGNOSIS — K3189 Other diseases of stomach and duodenum: Secondary | ICD-10-CM | POA: Diagnosis not present

## 2022-09-30 DIAGNOSIS — E669 Obesity, unspecified: Secondary | ICD-10-CM | POA: Diagnosis not present

## 2022-09-30 HISTORY — PX: ESOPHAGOGASTRODUODENOSCOPY (EGD) WITH PROPOFOL: SHX5813

## 2022-09-30 HISTORY — PX: BIOPSY: SHX5522

## 2022-09-30 LAB — FERRITIN: Ferritin: 7 ng/mL — ABNORMAL LOW (ref 24–336)

## 2022-09-30 LAB — RETICULOCYTES
Immature Retic Fract: 37 % — ABNORMAL HIGH (ref 2.3–15.9)
RBC.: 3.64 MIL/uL — ABNORMAL LOW (ref 4.22–5.81)
Retic Count, Absolute: 35.3 10*3/uL (ref 19.0–186.0)
Retic Ct Pct: 1 % (ref 0.4–3.1)

## 2022-09-30 LAB — CBC WITH DIFFERENTIAL/PLATELET
Abs Immature Granulocytes: 0.02 10*3/uL (ref 0.00–0.07)
Basophils Absolute: 0.1 10*3/uL (ref 0.0–0.1)
Basophils Relative: 1 %
Eosinophils Absolute: 0.2 10*3/uL (ref 0.0–0.5)
Eosinophils Relative: 3 %
HCT: 29.2 % — ABNORMAL LOW (ref 39.0–52.0)
Hemoglobin: 8.9 g/dL — ABNORMAL LOW (ref 13.0–17.0)
Immature Granulocytes: 0 %
Lymphocytes Relative: 24 %
Lymphs Abs: 2.1 10*3/uL (ref 0.7–4.0)
MCH: 21.9 pg — ABNORMAL LOW (ref 26.0–34.0)
MCHC: 30.5 g/dL (ref 30.0–36.0)
MCV: 71.7 fL — ABNORMAL LOW (ref 80.0–100.0)
Monocytes Absolute: 1.1 10*3/uL — ABNORMAL HIGH (ref 0.1–1.0)
Monocytes Relative: 12 %
Neutro Abs: 5.3 10*3/uL (ref 1.7–7.7)
Neutrophils Relative %: 60 %
Platelets: 509 10*3/uL — ABNORMAL HIGH (ref 150–400)
RBC: 4.07 MIL/uL — ABNORMAL LOW (ref 4.22–5.81)
RDW: 19.6 % — ABNORMAL HIGH (ref 11.5–15.5)
WBC: 8.8 10*3/uL (ref 4.0–10.5)
nRBC: 0 % (ref 0.0–0.2)

## 2022-09-30 LAB — IRON AND TIBC
Iron: 20 ug/dL — ABNORMAL LOW (ref 45–182)
Saturation Ratios: 4 % — ABNORMAL LOW (ref 17.9–39.5)
TIBC: 511 ug/dL — ABNORMAL HIGH (ref 250–450)
UIBC: 491 ug/dL

## 2022-09-30 LAB — POC OCCULT BLOOD, ED: Fecal Occult Bld: NEGATIVE

## 2022-09-30 LAB — VITAMIN B12: Vitamin B-12: 635 pg/mL (ref 180–914)

## 2022-09-30 LAB — FOLATE: Folate: 25.6 ng/mL (ref >5.9)

## 2022-09-30 LAB — PREPARE RBC (CROSSMATCH)

## 2022-09-30 SURGERY — ESOPHAGOGASTRODUODENOSCOPY (EGD) WITH PROPOFOL
Anesthesia: Monitor Anesthesia Care

## 2022-09-30 MED ORDER — SODIUM CHLORIDE 0.9 % IV SOLN: INTRAVENOUS | Status: DC

## 2022-09-30 MED ORDER — ACETAMINOPHEN 650 MG RE SUPP: 650.0000 mg | Freq: Four times a day (QID) | RECTAL | Status: DC | PRN

## 2022-09-30 MED ORDER — PROPOFOL 500 MG/50ML IV EMUL
INTRAVENOUS | Status: DC | PRN
  Administered 2022-09-30: 100 ug/kg/min via INTRAVENOUS

## 2022-09-30 MED ORDER — METHOCARBAMOL 1000 MG/10ML IJ SOLN: 1000.0000 mg | Freq: Four times a day (QID) | INTRAMUSCULAR | Status: DC | PRN

## 2022-09-30 MED ORDER — ACETAMINOPHEN 325 MG PO TABS
650.0000 mg | ORAL_TABLET | Freq: Four times a day (QID) | ORAL | Status: DC | PRN
  Administered 2022-10-02: 650 mg via ORAL
  Filled 2022-09-30: qty 2

## 2022-09-30 MED ORDER — ONDANSETRON HCL 4 MG PO TABS: 4.0000 mg | ORAL_TABLET | Freq: Four times a day (QID) | ORAL | Status: DC | PRN

## 2022-09-30 MED ORDER — PANTOPRAZOLE SODIUM 40 MG IV SOLR
40.0000 mg | Freq: Two times a day (BID) | INTRAVENOUS | Status: DC
  Administered 2022-09-30 – 2022-10-02 (×4): 40 mg via INTRAVENOUS
  Filled 2022-09-30 (×4): qty 10

## 2022-09-30 MED ORDER — PROPOFOL 10 MG/ML IV BOLUS
INTRAVENOUS | Status: DC | PRN
  Administered 2022-09-30 (×2): 10 mg via INTRAVENOUS

## 2022-09-30 MED ORDER — MORPHINE SULFATE (PF) 2 MG/ML IV SOLN: 1.0000 mg | INTRAVENOUS | Status: DC | PRN

## 2022-09-30 MED ORDER — PEG-KCL-NACL-NASULF-NA ASC-C 100 G PO SOLR: 1.0000 | Freq: Once | ORAL | Status: DC

## 2022-09-30 MED ORDER — METHOCARBAMOL 1000 MG/10ML IJ SOLN: 500.0000 mg | Freq: Four times a day (QID) | INTRAMUSCULAR | Status: DC | PRN

## 2022-09-30 MED ORDER — SODIUM CHLORIDE 0.9% IV SOLUTION: Freq: Once | INTRAVENOUS | Status: DC

## 2022-09-30 MED ORDER — PEG-KCL-NACL-NASULF-NA ASC-C 100 G PO SOLR
0.5000 | Freq: Once | ORAL | Status: AC
  Administered 2022-09-30: 100 g via ORAL

## 2022-09-30 MED ORDER — PEG-KCL-NACL-NASULF-NA ASC-C 100 G PO SOLR
0.5000 | Freq: Once | ORAL | Status: AC
  Administered 2022-09-30: 100 g via ORAL
  Filled 2022-09-30: qty 1

## 2022-09-30 MED ORDER — LACTATED RINGERS IV SOLN: INTRAVENOUS | Status: DC

## 2022-09-30 MED ORDER — ONDANSETRON HCL 4 MG/2ML IJ SOLN: 4.0000 mg | Freq: Four times a day (QID) | INTRAMUSCULAR | Status: DC | PRN

## 2022-09-30 MED ORDER — SODIUM CHLORIDE 0.9% FLUSH
3.0000 mL | Freq: Two times a day (BID) | INTRAVENOUS | Status: DC
  Administered 2022-09-30 – 2022-10-02 (×4): 3 mL via INTRAVENOUS

## 2022-09-30 MED ORDER — LIDOCAINE 2% (20 MG/ML) 5 ML SYRINGE
INTRAMUSCULAR | Status: DC | PRN
  Administered 2022-09-30: 50 mg via INTRAVENOUS

## 2022-09-30 SURGICAL SUPPLY — 15 items

## 2022-09-30 NOTE — ED Notes (Signed)
ED TO INPATIENT HANDOFF REPORT  ED Nurse Name and Phone #: [phone]  S Name/Age/Gender Lance Rollins 77 y.o. male Room/Bed: 009C/009C  Code Status   Code Status: Full Code  Home/SNF/Other Home Patient oriented to: self, place, time, and situation Is this baseline? Yes   Triage Complete: Triage complete  Chief Complaint Symptomatic anemia [D64.9] Syncope, unspecified syncope type [R55]  Triage Note Was upstairs visiting with daughter and began feeling hot, sweaty and had a syncopal episode from standing position. Witnessed by wife. Denies head injury. Does admit to left sided chest discomfort.   She admits he has not had anything to eat today except a few fries, no water, and has been up since 4AM.   Multiple other occasions of syncopal events.    Allergies No Known Allergies  Level of Care/Admitting Diagnosis ED Disposition     ED Disposition  Admit   Condition  --   Comment  Hospital Area: [facility] [[id number]]  Level of Care: Progressive [102]  Admit to Progressive based on following criteria: GI, ENDOCRINE disease patients with GI bleeding, acute liver failure or pancreatitis, stable with diabetic ketoacidosis or thyrotoxicosis (hypothyroid) state.  May place patient in observation at [facility] or [place] if equivalent level of care is available:: Yes  Covid Evaluation: Confirmed COVID Negative  Diagnosis: Symptomatic anemia [id number]  Admitting Physician: Aline August [id number]  Attending Physician: Samella Parr [[id number]]          B Medical/Surgery History Past Medical History:  Diagnosis Date   Arthritis    Bleeding per rectum 08/2013   "black and bright red" (08/14/2013)   Hypercholesteremia    Hypertension    Obesity    Prostate cancer ([place]) 2011   Unilateral primary osteoarthritis, right hip 04/23/2017   Past Surgical History:  Procedure Laterality Date   ESOPHAGOGASTRODUODENOSCOPY N/A 08/15/2013   Procedure:  ESOPHAGOGASTRODUODENOSCOPY (EGD);  Surgeon: Milus Banister, MD;  Location: [place];  Service: Endoscopy;  Laterality: N/A;   INGUINAL HERNIA REPAIR Bilateral ~ 2004   ROBOT ASSISTED LAPAROSCOPIC RADICAL PROSTATECTOMY  08/2009   [name] 08/14/2009 (08/14/2013)   TOTAL HIP ARTHROPLASTY Left 11/14/2013   Procedure: LEFT TOTAL HIP ARTHROPLASTY ANTERIOR APPROACH;  Surgeon: Mcarthur Rossetti, MD;  Location: [place];  Service: Orthopedics;  Laterality: Left;   TOTAL HIP ARTHROPLASTY Right 04/23/2017   Procedure: RIGHT TOTAL HIP ARTHROPLASTY ANTERIOR APPROACH;  Surgeon: Mcarthur Rossetti, MD;  [facility];  Service: Orthopedics;  Laterality: Right;     A IV Location/Drains/Wounds Patient Lines/Drains/Airways Status     Active Line/Drains/Airways     Name Placement date Placement time Site Days   Peripheral IV 09/30/22 20 G Anterior;Right Forearm 09/30/22  0253  Forearm  less than 1   Incision (Closed) 04/23/17 Hip Right 04/23/17  1135  -- 1986            Intake/Output Last 24 hours  Intake/Output Summary (Last 24 hours) at 09/30/2022 1453 Last data filed at 09/30/2022 1112 Gross per 24 hour  Intake 798.25 ml  Output --  Net 798.25 ml    Labs/Imaging Results for orders placed or performed during the hospital encounter of 09/29/22 (from the past 48 hour(s))  CBC WITH DIFFERENTIAL     Status: Abnormal   Collection Time: 09/29/22  7:49 PM  Result Value Ref Range   WBC 7.4 4.0 - 10.5 K/uL   RBC 3.66 (L) 4.22 - 5.81 MIL/uL   Hemoglobin 7.5 (L) 13.0 -  17.0 g/dL    Comment: Reticulocyte Hemoglobin testing may be clinically indicated, consider ordering this additional test [id number]    HCT 26.1 (L) 39.0 - 52.0 %   MCV 71.3 (L) 80.0 - 100.0 fL   MCH 20.5 (L) 26.0 - 34.0 pg   MCHC 28.7 (L) 30.0 - 36.0 g/dL   RDW 19.0 (H) 11.5 - 15.5 %   Platelets 593 (H) 150 - 400 K/uL   nRBC 0.0 0.0 - 0.2 %   Neutrophils Relative % 49 %   Neutro Abs 3.7 1.7 - 7.7 K/uL   Lymphocytes  Relative 35 %   Lymphs Abs 2.6 0.7 - 4.0 K/uL   Monocytes Relative 11 %   Monocytes Absolute 0.8 0.1 - 1.0 K/uL   Eosinophils Relative 4 %   Eosinophils Absolute 0.3 0.0 - 0.5 K/uL   Basophils Relative 1 %   Basophils Absolute 0.1 0.0 - 0.1 K/uL   Immature Granulocytes 0 %   Abs Immature Granulocytes 0.01 0.00 - 0.07 K/uL    Comment: Performed at [facility], [address] [place], [place] [address]  Comprehensive metabolic panel     Status: Abnormal   Collection Time: 09/29/22  7:49 PM  Result Value Ref Range   Sodium 136 135 - 145 mmol/L   Potassium 3.9 3.5 - 5.1 mmol/L   Chloride 101 98 - 111 mmol/L   CO2 24 22 - 32 mmol/L   Glucose, Bld 100 (H) 70 - 99 mg/dL    Comment: Glucose reference range applies only to samples taken after fasting for at least 8 hours.   BUN 18 8 - 23 mg/dL   Creatinine, Ser 1.16 0.61 - 1.24 mg/dL   Calcium 9.0 8.9 - 10.3 mg/dL   Total Protein 7.4 6.5 - 8.1 g/dL   Albumin 3.4 (L) 3.5 - 5.0 g/dL   AST 22 15 - 41 U/L   ALT 21 0 - 44 U/L   Alkaline Phosphatase 60 38 - 126 U/L   Total Bilirubin 0.3 0.3 - 1.2 mg/dL   GFR, Estimated >60 >60 mL/min    Comment: (NOTE) Calculated using the CKD-EPI Creatinine Equation (2021)    Anion gap 11 5 - 15    Comment: Performed at [facility] [address], [place],  [address]  Type and screen [place]     Status: None (Preliminary result)   Collection Time: 09/29/22  7:49 PM  Result Value Ref Range   ABO/RH(D) B POS    Antibody Screen NEG    Sample Expiration 10/02/2022,2359    Unit Number [id number]    Blood Component Type RBC LR PHER2    Unit division 00    Status of Unit ISSUED    Transfusion Status OK TO TRANSFUSE    Crossmatch Result      Compatible Performed at [facility], [place] [address], [place], [place] [address]   Troponin I (High Sensitivity)     Status: None   Collection Time: 09/29/22  7:49 PM  Result Value Ref Range   Troponin I (High  Sensitivity) 4 <18 ng/L    Comment: (NOTE) Elevated high sensitivity troponin I (hsTnI) values and significant  changes across serial measurements may suggest ACS but many other  chronic and acute conditions are known to elevate hsTnI results.  Refer to the "Links" section for chest pain algorithms and additional  guidance. Performed at [facility], [place],  [place] [address]   CBG monitoring, ED     Status: Abnormal   Collection Time: 09/29/22  8:04 PM  Result Value Ref Range   Glucose-Capillary 102 (H) 70 - 99 mg/dL    Comment: Glucose reference range applies only to samples taken after fasting for at least 8 hours.  Troponin I (High Sensitivity)     Status: None   Collection Time: 09/29/22 10:08 PM  Result Value Ref Range   Troponin I (High Sensitivity) 4 <18 ng/L    Comment: (NOTE) Elevated high sensitivity troponin I (hsTnI) values and significant  changes across serial measurements may suggest ACS but many other  chronic and acute conditions are known to elevate hsTnI results.  Refer to the "Links" section for chest pain algorithms and additional  guidance. Performed at [facility], [place] [address] [place], [place] [address]   POC occult blood, ED     Status: None   Collection Time: 09/30/22  1:35 AM  Result Value Ref Range   Fecal Occult Bld NEGATIVE NEGATIVE  Vitamin B12     Status: None   Collection Time: 09/30/22  2:30 AM  Result Value Ref Range   Vitamin B-12 635 180 - 914 pg/mL    Comment: (NOTE) This assay is not validated for testing neonatal or myeloproliferative syndrome specimens for Vitamin B12 levels. Performed at [facility], [place] [address] [place], [place] [address]   Folate     Status: None   Collection Time: 09/30/22  2:30 AM  Result Value Ref Range   Folate 25.6 >5.9 ng/mL    Comment: Performed at [facility] [address] [place], [place] [address]  Iron and TIBC     Status: Abnormal   Collection  Time: 09/30/22  2:30 AM  Result Value Ref Range   Iron 20 (L) 45 - 182 ug/dL   TIBC 511 (H) 250 - 450 ug/dL   Saturation Ratios 4 (L) 17.9 - 39.5 %   UIBC 491 ug/dL    Comment: Performed at [facility], [place] [address] [place], [place] [address]  Ferritin     Status: Abnormal   Collection Time: 09/30/22  2:30 AM  Result Value Ref Range   Ferritin 7 (L) 24 - 336 ng/mL    Comment: Performed at [facility], [place] [address], [place], [place] [address]  Reticulocytes     Status: Abnormal   Collection Time: 09/30/22  2:30 AM  Result Value Ref Range   Retic Ct Pct 1.0 0.4 - 3.1 %   RBC. 3.64 (L) 4.22 - 5.81 MIL/uL   Retic Count, Absolute 35.3 19.0 - 186.0 K/uL   Immature Retic Fract 37.0 (H) 2.3 - 15.9 %    Comment: Performed at [facility] [address], [place], [place] [address]  Prepare RBC (crossmatch)     Status: None   Collection Time: 09/30/22  2:30 AM  Result Value Ref Range   Order Confirmation      ORDER PROCESSED BY BLOOD BANK Performed at [facility], [place] [address] [place], [place] [address]   CBC with Differential     Status: Abnormal   Collection Time: 09/30/22  9:12 AM  Result Value Ref Range   WBC 8.8 4.0 - 10.5 K/uL   RBC 4.07 (L) 4.22 - 5.81 MIL/uL   Hemoglobin 8.9 (L) 13.0 - 17.0 g/dL    Comment: Reticulocyte Hemoglobin testing may be clinically  indicated, consider ordering this additional test [id number]    HCT 29.2 (L) 39.0 - 52.0 %   MCV 71.7 (L) 80.0 - 100.0 fL   MCH 21.9 (L) 26.0 - 34.0 pg   MCHC 30.5 30.0 - 36.0 g/dL   RDW 19.6 (H) 11.5 - 15.5 %   Platelets 509 (H) 150 - 400 K/uL   nRBC 0.0 0.0 - 0.2 %   Neutrophils Relative % 60 %   Neutro Abs 5.3 1.7 - 7.7 K/uL   Lymphocytes Relative 24 %   Lymphs Abs 2.1 0.7 - 4.0 K/uL   Monocytes Relative 12 %   Monocytes Absolute 1.1 (H) 0.1 - 1.0 K/uL   Eosinophils Relative 3 %   Eosinophils Absolute 0.2 0.0 - 0.5 K/uL   Basophils Relative 1 %   Basophils Absolute 0.1 0.0 -  0.1 K/uL   Immature Granulocytes 0 %   Abs Immature Granulocytes 0.02 0.00 - 0.07 K/uL    Comment: Performed at [facility], [address] [place], [place] [address]   DG Chest 2 View  Result Date: 09/29/2022 CLINICAL DATA:  Diaphoresis, syncope, left-sided chest discomfort EXAM: CHEST - 2 VIEW COMPARISON:  03/18/2007 FINDINGS: Frontal and lateral views of the chest demonstrate an unremarkable cardiac silhouette. No airspace disease, effusion, or pneumothorax. Focal eventration right hemidiaphragm. No acute bony abnormalities. IMPRESSION: 1. No acute intrathoracic process. Electronically Signed   By: Randa Ngo M.D.   On: 09/29/2022 20:32    Pending Labs Unresulted Labs (From admission, onward)     Start     Ordered   10/01/22 [id number]  Basic metabolic panel  Tomorrow morning,   R        09/30/22 0654   10/01/22 0500  CBC  Tomorrow morning,   R        09/30/22 0654   10/01/22 0500  Magnesium  Tomorrow morning,   R        09/30/22 1102   Unscheduled  Occult blood card to lab, stool RN will collect  As needed,   R     Question:  Specimen to be collected by:  Answer:  RN will collect   09/30/22 0656            Vitals/Pain Today's Vitals   09/30/22 1200 09/30/22 1300 09/30/22 1330 09/30/22 1400  BP: 132/76 (!) 145/78 (!) 146/83 (!) 143/129  Pulse: 73   79  Resp: 17 (!) 21 17 (!) 21  Temp:      TempSrc:      SpO2: 95%   95%  Weight:      Height:      PainSc:        Isolation Precautions No active isolations  Medications Medications  0.9 %  sodium chloride infusion (Manually program via Guardrails IV Fluids) ( Intravenous MAR Unhold 09/30/22 1241)  pantoprazole (PROTONIX) injection 40 mg ( Intravenous MAR Unhold 09/30/22 1241)  sodium chloride flush (NS) 0.9 % injection 3 mL ( Intravenous MAR Unhold 09/30/22 1241)  acetaminophen (TYLENOL) tablet 650 mg ( Oral MAR Unhold 09/30/22 1241)    Or  acetaminophen (TYLENOL) suppository 650 mg ( Rectal MAR Unhold 09/30/22  1241)  ondansetron (ZOFRAN) tablet 4 mg ( Oral MAR Unhold 09/30/22 1241)    Or  ondansetron (ZOFRAN) injection 4 mg ( Intravenous MAR Unhold 09/30/22 1241)  methocarbamol (ROBAXIN) injection 500 mg ( Intravenous MAR Unhold 09/30/22 1241)  morphine (PF) 2 MG/ML injection 1 mg ( Intravenous MAR Unhold  09/30/22 1241)  0.9 %  sodium chloride infusion ( Intravenous Restarted 09/30/22 1420)  peg 3350 powder (MOVIPREP) kit 100 g (has no administration in time range)    And  peg 3350 powder (MOVIPREP) kit 100 g (has no administration in time range)    Mobility walks     Focused Assessments Cardiac Assessment Handoff:  Cardiac Rhythm: Normal sinus rhythm No results found for: "CKTOTAL", "CKMB", "CKMBINDEX", "TROPONINI" No results found for: "DDIMER" Does the Patient currently have chest pain? No    R Recommendations: See Admitting Provider Note  Report given to:   Additional Notes:

## 2022-09-30 NOTE — Care Management (Signed)
  Transition of Care [facility]) Screening Note   Patient Details  Name: Lance Rollins Date of Birth: Mar 16, 1946   Transition of Care [facility]) CM/SW Contact:    Levonne Lapping, RN Phone Number: 09/30/2022, 3:41 PM    Transition of Care Department [facility]) has reviewed patient and no TOC needs have been identified at this time. We will continue to monitor patient advancement through interdisciplinary progression rounds. If new patient transition needs arise, please place a TOC consult.

## 2022-09-30 NOTE — Op Note (Signed)
[facility] Patient Name: Lance Rollins Procedure Date : 09/30/2022 MRN: [id number] Attending MD: Thornton Park MD, MD, [id number] Date of Birth: 12-Oct-1945 CSN: [id number] Age: 77 Admit Type: Inpatient Procedure:                Upper GI endoscopy Indications:              Unexplained iron deficiency anemia Providers:                Thornton Park MD, MD, Glori Bickers, RN, Tyrone Apple, Technician, Virgilio Belling. Beckner, CRNA Referring MD:              Medicines:                Monitored Anesthesia Care Complications:            No immediate complications. Estimated Blood Loss:     Estimated blood loss was minimal. Procedure:                Pre-Anesthesia Assessment:                           - Prior to the procedure, a History and Physical                            was performed, and patient medications and                            allergies were reviewed. The patient's tolerance of                            previous anesthesia was also reviewed. The risks                            and benefits of the procedure and the sedation                            options and risks were discussed with the patient.                            All questions were answered, and informed consent                            was obtained. Prior Anticoagulants: The patient has                            taken no anticoagulant or antiplatelet agents. ASA                            Grade Assessment: III - A patient with severe                            systemic disease. After reviewing the risks and  benefits, the patient was deemed in satisfactory                            condition to undergo the procedure.                           After obtaining informed consent, the endoscope was                            passed under direct vision. Throughout the                            procedure, the patient's blood pressure, pulse,  and                            oxygen saturations were monitored continuously. The                            [id number] Olympus endoscope was introduced                            through the mouth, and advanced to the second part                            of duodenum. The upper GI endoscopy was                            accomplished without difficulty. The patient                            tolerated the procedure well. Scope In: Scope Out: Findings:      The esophagus was normal. The z-line is located 39 cm from the incisors.      Patchy mildly erythematous mucosa without bleeding was found in the       gastric antrum. Biopsies were taken from the antrum, body, and fundus       with a cold forceps for histology. Estimated blood loss was minimal.      The examined duodenum was normal. Biopsies were taken with a cold       forceps for histology. Estimated blood loss was minimal.      The cardia and gastric fundus were normal on retroflexion.      The exam was otherwise without abnormality. Impression:               - Normal esophagus.                           - Erythematous mucosa in the antrum. Biopsied.                           - Normal examined duodenum. Biopsied.                           - No obvious source for symptomatic iron deficiency  anemia. Recommendation:           - Patient has a contact number available for                            emergencies. The signs and symptoms of potential                            delayed complications were discussed with the                            patient. Return to normal activities tomorrow.                            Written discharge instructions were provided to the                            patient.                           - Clear liquid diet today with plans for bowel prep                            tonight.                           - Continue present medications.                            - May reduce pantoprazole to 40 mg daily for at                            least 8 weeks given the gastritis.                           - Await pathology results.                           - No aspirin, ibuprofen, naproxen, or other                            non-steroidal anti-inflammatory drugs.                           - Colonoscopy 10/01/22.                           - The findings and recommendations were discussed                            with the patient. Procedure Code(s):        --- Professional ---                           [phone], Esophagogastroduodenoscopy, flexible,                            transoral;  with biopsy, single or multiple Diagnosis Code(s):        --- Professional ---                           K31.89, Other diseases of stomach and duodenum                           D50.9, Iron deficiency anemia, unspecified CPT copyright 2022 [employer]. All rights reserved. The codes documented in this report are preliminary and upon coder review may  be revised to meet current compliance requirements. Thornton Park MD, MD 09/30/2022 11:23:13 AM This report has been signed electronically. Number of Addenda: 0

## 2022-09-30 NOTE — ED Notes (Addendum)
Orthostatic vitals completed with no issue.

## 2022-09-30 NOTE — ED Provider Notes (Signed)
[facility] Provider Note   CSN: [id number] Arrival date & time: 09/29/22  1926     History  Chief Complaint  Patient presents with   Syncopal Episode    Lance Rollins is a 77 y.o. male.  77 year old male with past medical history of hyperlipidemia, prostate cancer, hypertension presents after syncopal episode today.  Patient states that he went to the gym this morning, had not had anything to eat all day, was visiting someone in the hospital when he started to feel clammy and weak with discomfort in his chest (indigestion) and passed out.  Episode was witnessed by his wife, reports brief LOC, no injuries, was able to step out into the hall and felt better.  History of prior GI bleed, is not anticoagulated, takes diclofenac and aspirin daily, notes having dark stools about a week or 2 ago.  Reports shortness of breath with exertion at times.       Home Medications Prior to Admission medications   Medication Sig Start Date End Date Taking? Authorizing Provider  aspirin 325 MG EC tablet Take 1 tablet (325 mg total) by mouth 2 (two) times daily after a meal. Patient taking differently: Take 325 mg by mouth daily. 04/24/17  Yes Mcarthur Rossetti, MD  cyclobenzaprine (FLEXERIL) 10 MG tablet Take 1 tablet (10 mg total) by mouth 3 (three) times daily as needed for muscle spasms (hip tightness). 09/21/22  Yes Lovorn, Jinny Blossom, MD  diclofenac (VOLTAREN) 75 MG EC tablet Take 1 tablet (75 mg total) by mouth daily as needed. Patient taking differently: Take 75 mg by mouth daily as needed for moderate pain. 09/21/22  Yes Lovorn, Jinny Blossom, MD  irbesartan-hydrochlorothiazide (AVALIDE) 300-12.5 MG tablet Take 1 tablet by mouth daily. 11/03/19  Yes [provider]  Multiple Vitamin (MULTIVITAMIN WITH MINERALS) TABS tablet Take 1 tablet by mouth daily.   Yes [provider]  nabumetone (RELAFEN) 750 MG tablet Take 1 tablet by mouth twice daily  as needed Patient taking differently: Take 750 mg by mouth daily as needed for moderate pain. 05/06/20  Yes Mcarthur Rossetti, MD  simvastatin (ZOCOR) 40 MG tablet Take 40 mg by mouth daily. 08/09/19  Yes [provider]      Allergies    Patient has no known allergies.    Review of Systems   Review of Systems Negative except as per HPI Physical Exam Updated Vital Signs BP 130/72   Pulse 76   Temp 98.2 F (36.8 C) (Oral)   Resp 18   Ht 5' 7"$  (1.702 m)   Wt 102.5 kg   SpO2 100%   BMI 35.40 kg/m  Physical Exam Vitals and nursing note reviewed. Exam conducted with a chaperone present.  Constitutional:      General: He is not in acute distress.    Appearance: He is well-developed. He is not diaphoretic.  HENT:     Head: Normocephalic and atraumatic.     Mouth/Throat:     Mouth: Mucous membranes are moist.  Cardiovascular:     Rate and Rhythm: Normal rate and regular rhythm.     Pulses: Normal pulses.     Heart sounds: Murmur heard.     Systolic murmur is present.  Pulmonary:     Effort: Pulmonary effort is normal.  Abdominal:     Palpations: Abdomen is soft.     Tenderness: There is no abdominal tenderness.  Genitourinary:    Rectum: Guaiac result negative.  Comments: Stool soft, light brown Musculoskeletal:     Right lower leg: No edema.     Left lower leg: No edema.  Skin:    General: Skin is warm and dry.     Findings: No erythema or rash.  Neurological:     Mental Status: He is alert and oriented to person, place, and time.  Psychiatric:        Behavior: Behavior normal.     ED Results / Procedures / Treatments   Labs (all labs ordered are listed, but only abnormal results are displayed) Labs Reviewed  CBC WITH DIFFERENTIAL/PLATELET - Abnormal; Notable for the following components:      Result Value   RBC 3.66 (*)    Hemoglobin 7.5 (*)    HCT 26.1 (*)    MCV 71.3 (*)    MCH 20.5 (*)    MCHC 28.7 (*)    RDW 19.0 (*)    Platelets  593 (*)    All other components within normal limits  COMPREHENSIVE METABOLIC PANEL - Abnormal; Notable for the following components:   Glucose, Bld 100 (*)    Albumin 3.4 (*)    All other components within normal limits  IRON AND TIBC - Abnormal; Notable for the following components:   Iron 20 (*)    TIBC 511 (*)    Saturation Ratios 4 (*)    All other components within normal limits  FERRITIN - Abnormal; Notable for the following components:   Ferritin 7 (*)    All other components within normal limits  RETICULOCYTES - Abnormal; Notable for the following components:   RBC. 3.64 (*)    Immature Retic Fract 37.0 (*)    All other components within normal limits  CBG MONITORING, ED - Abnormal; Notable for the following components:   Glucose-Capillary 102 (*)    All other components within normal limits  VITAMIN B12  FOLATE  POC OCCULT BLOOD, ED  TYPE AND SCREEN  PREPARE RBC (CROSSMATCH)  TROPONIN I (HIGH SENSITIVITY)  TROPONIN I (HIGH SENSITIVITY)    EKG EKG Interpretation  Date/Time:  Tuesday September 29 2022 19:49:57 EST Ventricular Rate:  76 PR Interval:  164 QRS Duration: 82 QT Interval:  368 QTC Calculation: 414 R Axis:   5 Text Interpretation: Normal sinus rhythm Normal ECG When compared with ECG of 08-Dec-2019 20:25, PREVIOUS ECG IS PRESENT No acute changes Confirmed by Addison Lank [phone]) on 09/30/2022 1:33:30 AM  Radiology DG Chest 2 View  Result Date: 09/29/2022 CLINICAL DATA:  Diaphoresis, syncope, left-sided chest discomfort EXAM: CHEST - 2 VIEW COMPARISON:  03/18/2007 FINDINGS: Frontal and lateral views of the chest demonstrate an unremarkable cardiac silhouette. No airspace disease, effusion, or pneumothorax. Focal eventration right hemidiaphragm. No acute bony abnormalities. IMPRESSION: 1. No acute intrathoracic process. Electronically Signed   By: Randa Ngo M.D.   On: 09/29/2022 20:32    Procedures .Critical Care  Performed by: Tacy Learn,  PA-C Authorized by: Tacy Learn, PA-C   Critical care provider statement:    Critical care time (minutes):  30   Critical care was time spent personally by me on the following activities:  Development of treatment plan with patient or surrogate, discussions with consultants, evaluation of patient's response to treatment, examination of patient, ordering and review of laboratory studies, ordering and review of radiographic studies, ordering and performing treatments and interventions, pulse oximetry, re-evaluation of patient's condition and review of old charts     Medications Ordered in ED  Medications  0.9 %  sodium chloride infusion (Manually program via Guardrails IV Fluids) (has no administration in time range)    ED Course/ Medical Decision Making/ A&P                             Medical Decision Making Amount and/or Complexity of Data Reviewed Labs: ordered.  Risk Prescription drug management. Decision regarding hospitalization.   This patient presents to the ED for concern of syncope, this involves an extensive number of treatment options, and is a complaint that carries with it a high risk of complications and morbidity.  The differential diagnosis includes but not limited to arrhythmia, metabolic disturbance such as hypoglycemia, orthostatic hypotension, anemia   Co morbidities that complicate the patient evaluation  HTN, prostate CA, bleeding per rectum,    Additional history obtained:  Additional history obtained from wife at bedside who contributes to history as above External records from outside source obtained and reviewed including prior labs for comparison   Lab Tests:  I Ordered, and personally interpreted labs.  The pertinent results include: CBC with hemoglobin of 7.5 today (previously 12.92 years ago).  CMP without significant findings.  Troponins are unremarkable.  Hemoccult negative.   Imaging Studies ordered:  I ordered imaging studies  including chest x-ray I independently visualized and interpreted imaging which showed no acute process I agree with the radiologist interpretation   Cardiac Monitoring: / EKG:  The patient was maintained on a cardiac monitor.  I personally viewed and interpreted the cardiac monitored which showed an underlying rhythm of: Normal sinus rhythm, rate 76   Consultations Obtained:  I requested consultation with the hospitalist, Dr. Bridgett Larsson,  and discussed lab and imaging findings as well as pertinent plan - they recommend: Admit to hospital, day team to see patient, request GI consult. Secure chat sent to Dr. Lyndel Safe with [facility] requesting GI consult while admitted.   Problem List / ED Course / Critical interventions / Medication management  77 year old male presents to the ER after syncopal episode while visiting family in the hospital today.  Reports feeling clammy with sensation of indigestion prior to syncopal event, no injuries related.  Patient walked out into the hall and felt better afterwards.  Family and patient suspect his symptoms are due to having not eaten today, having his workout and doing his usual activities for the day.  His vitals are stable, he is found to have a significant drop in his hemoglobin however prior on file from 2 years ago.  Hemoglobin today is 7.5.  Concern for symptomatic anemia, patient consents for transfusion today.  Concern for syncopal event, plan is to admit.  Patient does state that he had black stools a few weeks ago, unsure if related to eating blueberries however with his drop in hemoglobin, request GI consult while admitted to the hospital, secure chat sent to GI team. I ordered medication including packed red blood cells for anemia Reevaluation of the patient after these medicines showed that the patient stayed the same I have reviewed the patients home medicines and have made adjustments as needed   Social Determinants of Health:  Lives with  spouse   Test / Admission - Considered:  Admit for transfusion and further workup         Final Clinical Impression(s) / ED Diagnoses Final diagnoses:  Syncope, unspecified syncope type  Symptomatic anemia    Rx / DC Orders ED Discharge Orders  None         Roque Lias 09/30/22 0515    Fatima Blank, MD 09/30/22 [phone]

## 2022-09-30 NOTE — H&P (View-Only) (Signed)
Consultation  Referring Provider: Erin Hearing, NP    Primary Care Physician:  Lawerance Cruel, MD Primary Gastroenterologist: Dr. Ardis Hughs       Reason for Consultation:   Symptomatic anemia         HPI:   Lance Rollins is a 77 y.o. male with a past medical history as listed below including hypertension, obesity and chronic pain secondary to bilateral hip bursitis/trochanteric bursitis and history of prostatic adenocarcinoma status post radical prostatectomy in 2011, who presented to the ER today with a complaint of a syncopal episode.  We are consulted in regards to anemia.    At time of presentation patient had been visiting his daughter who is a patient in the hospital and had a near syncopal episode.,  Labs revealed a hemoglobin of 7.5 with an MCV of 71.3 and platelets for 593,000 (baseline hemoglobin in 2021 was 12.9).  He described that about 2 to 4 weeks ago he was having black stools but it did not recur.  On chronic NSAIDs for chronic pain syndrome.  Associated symptoms include epigastric pain and reflux.  No PPI.    Today, the patient tells me that a month ago he did have about 3 days of black tarry looking stool, at first this was quite a lot but then seemed to decrease with the days.  He had not noticed any since then.  He was upstairs visiting his daughter who recently had brain surgery to remove a cyst and had a syncopal episode.  He was standing for a long period of time and then all of a sudden ended up on the floor.  They brought him to the ED as above.  Otherwise patient had been feeling fairly though he did note some palpitations and may be some chest tightness/indigestion here and there for which he had been taking Tums prn.  Describes being on Ibuprofen high-dose for years given his hip issues of bursitis.  He has never been on a PPI or had any previous issues with reflux.    Denies fever, chills, weight loss, nausea or vomiting.  GI history: 03/08/2020 colonoscopy Dr.  Ardis Hughs one 4 mm polyp in the transverse colon, mild inflammation in the distal rectum and small internal hemorrhoids; pathology showed tubular adenoma and prolapse type tissue in the rectum-repeat recommended in 7 years  Past Medical History:  Diagnosis Date   Arthritis    Bleeding per rectum 08/2013   "black and bright red" (08/14/2013)   Hypercholesteremia    Hypertension    Obesity    Prostate cancer ([place]) 2011   Unilateral primary osteoarthritis, right hip 04/23/2017    Past Surgical History:  Procedure Laterality Date   ESOPHAGOGASTRODUODENOSCOPY N/A 08/15/2013   Procedure: ESOPHAGOGASTRODUODENOSCOPY (EGD);  Surgeon: Milus Banister, MD;  Location: [place];  Service: Endoscopy;  Laterality: N/A;   INGUINAL HERNIA REPAIR Bilateral ~ 2004   ROBOT ASSISTED LAPAROSCOPIC RADICAL PROSTATECTOMY  08/2009   [name] 08/14/2009 (08/14/2013)   TOTAL HIP ARTHROPLASTY Left 11/14/2013   Procedure: LEFT TOTAL HIP ARTHROPLASTY ANTERIOR APPROACH;  Surgeon: Mcarthur Rossetti, MD;  Location: [place];  Service: Orthopedics;  Laterality: Left;   TOTAL HIP ARTHROPLASTY Right 04/23/2017   Procedure: RIGHT TOTAL HIP ARTHROPLASTY ANTERIOR APPROACH;  Surgeon: Mcarthur Rossetti, MD;  Location: [facility];  Service: Orthopedics;  Laterality: Right;    Family History  Problem Relation Age of Onset   Diabetes Sister    Diabetes Mother    Breast cancer  Mother    Colon cancer Neg Hx    Stomach cancer Neg Hx    Esophageal cancer Neg Hx    Pancreatic cancer Neg Hx     Social History   Tobacco Use   Smoking status: Never   Smokeless tobacco: Never  Vaping Use   Vaping Use: Never used  Substance Use Topics   Alcohol use: Yes    Comment: 08/14/2013 "might have a drink q other holiday"   Drug use: No    Prior to Admission medications   Medication Sig Start Date End Date Taking? Authorizing Provider  aspirin 325 MG EC tablet Take 1 tablet (325 mg total) by mouth 2 (two) times daily after a meal. Patient  taking differently: Take 325 mg by mouth daily. 04/24/17  Yes Mcarthur Rossetti, MD  cyclobenzaprine (FLEXERIL) 10 MG tablet Take 1 tablet (10 mg total) by mouth 3 (three) times daily as needed for muscle spasms (hip tightness). 09/21/22  Yes Lovorn, Jinny Blossom, MD  diclofenac (VOLTAREN) 75 MG EC tablet Take 1 tablet (75 mg total) by mouth daily as needed. Patient taking differently: Take 75 mg by mouth daily as needed for moderate pain. 09/21/22  Yes Lovorn, Jinny Blossom, MD  irbesartan-hydrochlorothiazide (AVALIDE) 300-12.5 MG tablet Take 1 tablet by mouth daily. 11/03/19  Yes [provider]  Multiple Vitamin (MULTIVITAMIN WITH MINERALS) TABS tablet Take 1 tablet by mouth daily.   Yes [provider]  nabumetone (RELAFEN) 750 MG tablet Take 1 tablet by mouth twice daily as needed Patient taking differently: Take 750 mg by mouth daily as needed for moderate pain. 05/06/20  Yes Mcarthur Rossetti, MD  simvastatin (ZOCOR) 40 MG tablet Take 40 mg by mouth daily. 08/09/19  Yes [provider]    Current Facility-Administered Medications  Medication Dose Route Frequency Provider Last Rate Last Admin   0.9 %  sodium chloride infusion (Manually program via Guardrails IV Fluids)   Intravenous Once Suella Broad A, PA-C       0.9 %  sodium chloride infusion   Intravenous Continuous Samella Parr, NP 75 mL/hr at 09/30/22 0749 New Bag at 09/30/22 0749   acetaminophen (TYLENOL) tablet 650 mg  650 mg Oral Q6H PRN Samella Parr, NP       Or   acetaminophen (TYLENOL) suppository 650 mg  650 mg Rectal Q6H PRN Samella Parr, NP       methocarbamol (ROBAXIN) injection 500 mg  500 mg Intravenous Q6H PRN Samella Parr, NP       morphine (PF) 2 MG/ML injection 1 mg  1 mg Intravenous Q2H PRN Samella Parr, NP       ondansetron (ZOFRAN) tablet 4 mg  4 mg Oral Q6H PRN Samella Parr, NP       Or   ondansetron [facility]) injection 4 mg  4 mg Intravenous Q6H PRN Samella Parr, NP        pantoprazole (PROTONIX) injection 40 mg  40 mg Intravenous Q12H Samella Parr, NP       sodium chloride flush (NS) 0.9 % injection 3 mL  3 mL Intravenous Q12H Samella Parr, NP       Current Outpatient Medications  Medication Sig Dispense Refill   aspirin 325 MG EC tablet Take 1 tablet (325 mg total) by mouth 2 (two) times daily after a meal. (Patient taking differently: Take 325 mg by mouth daily.) 40 tablet 0   cyclobenzaprine (FLEXERIL) 10 MG tablet Take  1 tablet (10 mg total) by mouth 3 (three) times daily as needed for muscle spasms (hip tightness). 60 tablet 11   diclofenac (VOLTAREN) 75 MG EC tablet Take 1 tablet (75 mg total) by mouth daily as needed. (Patient taking differently: Take 75 mg by mouth daily as needed for moderate pain.) 35 tablet 11   irbesartan-hydrochlorothiazide (AVALIDE) 300-12.5 MG tablet Take 1 tablet by mouth daily.     Multiple Vitamin (MULTIVITAMIN WITH MINERALS) TABS tablet Take 1 tablet by mouth daily.     nabumetone (RELAFEN) 750 MG tablet Take 1 tablet by mouth twice daily as needed (Patient taking differently: Take 750 mg by mouth daily as needed for moderate pain.) 60 tablet 0   simvastatin (ZOCOR) 40 MG tablet Take 40 mg by mouth daily.      Allergies as of 09/29/2022   (No Known Allergies)     Review of Systems:    Constitutional: No weight loss, fever or chills Skin: No rash  Cardiovascular: No chest pain  Respiratory: No SOB Gastrointestinal: See HPI and otherwise negative Genitourinary: No dysuria  Neurological:+syncope Musculoskeletal: No new muscle or joint pain Hematologic: No bleeding  Psychiatric: No history of depression or anxiety    Physical Exam:  Vital signs in last 24 hours: Temp:  [97.9 F (36.6 C)-98.2 F (36.8 C)] 98.2 F (36.8 C) (02/21 0551) Pulse Rate:  [72-82] 72 (02/21 0552) Resp:  [16-20] 18 (02/21 0830) BP: (120-161)/(56-86) 139/72 (02/21 0830) SpO2:  [93 %-100 %] 100 % (02/21 0552) Weight:  [102.5  kg] 102.5 kg (02/20 1943)   General:   Pleasant AA male appears to be in NAD, Well developed, Well nourished, alert and cooperative Head:  Normocephalic and atraumatic. Eyes:   PEERL, EOMI. No icterus. Conjunctiva pink. Ears:  Normal auditory acuity. Neck:  Supple Throat: Oral cavity and pharynx without inflammation, swelling or lesion. Teeth in good condition. Lungs: Respirations even and unlabored. Lungs clear to auscultation bilaterally.   No wheezes, crackles, or rhonchi.  Heart: Normal S1, S2. +MURMUR Regular rate and rhythm. No peripheral edema, cyanosis or pallor.  Abdomen:  Soft, nondistended, nontender. No rebound or guarding. Normal bowel sounds. No appreciable masses or hepatomegaly. Rectal:  Not performed.  Msk:  Symmetrical without gross deformities. Peripheral pulses intact.  Extremities:  Without edema, no deformity or joint abnormality.  Neurologic:  Alert and  oriented x4;  grossly normal neurologically.  Skin:   Dry and intact without significant lesions or rashes. Psychiatric: Demonstrates good judgement and reason without abnormal affect or behaviors.   LAB RESULTS: Recent Labs    09/29/22 1949  WBC 7.4  HGB 7.5*  HCT 26.1*  PLT 593*   BMET Recent Labs    09/29/22 1949  NA 136  K 3.9  CL 101  CO2 24  GLUCOSE 100*  BUN 18  CREATININE 1.16  CALCIUM 9.0   LFT Recent Labs    09/29/22 1949  PROT 7.4  ALBUMIN 3.4*  AST 22  ALT 21  ALKPHOS 60  BILITOT 0.3    STUDIES: DG Chest 2 View  Result Date: 09/29/2022 CLINICAL DATA:  Diaphoresis, syncope, left-sided chest discomfort EXAM: CHEST - 2 VIEW COMPARISON:  03/18/2007 FINDINGS: Frontal and lateral views of the chest demonstrate an unremarkable cardiac silhouette. No airspace disease, effusion, or pneumothorax. Focal eventration right hemidiaphragm. No acute bony abnormalities. IMPRESSION: 1. No acute intrathoracic process. Electronically Signed   By: Randa Ngo M.D.   On: 09/29/2022 20:32  Impression / Plan:   Impression: 1.  Symptomatic anemia: Hemoglobin down to 7.5 (baseline from 2021 around 12.9), patient with some palpitations/indigestion and episode of syncope yesterday, history of NSAID use for hip bursitis; most likely PUD 2.  Syncopal episode  Plan: 1.  Scheduled patient for an EGD today with Dr. Tarri Glenn.  Did discuss risk, benefits, limitations and alternatives and the patient agrees to proceed. 2.  Agree with Pantoprazole 40 mg IV twice daily 3.  Continue Zofran as needed 4.  Continue to monitor hemoglobin and transfusion as needed less than 7. 5.  Please await further recommendations from Dr. Tarri Glenn after time of procedure.  Patient will remain n.p.o. until then.  Thank you for your kind consultation, we will continue to follow.  Lavone Nian Lydian Chavous  09/30/2022, 9:22 AM

## 2022-09-30 NOTE — Consult Note (Signed)
Consultation  Referring Provider: Erin Hearing, NP    Primary Care Physician:  Lawerance Cruel, MD Primary Gastroenterologist: Dr. Ardis Hughs       Reason for Consultation:   Symptomatic anemia         HPI:   Lance Rollins is a 77 y.o. male with a past medical history as listed below including hypertension, obesity and chronic pain secondary to bilateral hip bursitis/trochanteric bursitis and history of prostatic adenocarcinoma status post radical prostatectomy in 2011, who presented to the ER today with a complaint of a syncopal episode.  We are consulted in regards to anemia.    At time of presentation patient had been visiting his daughter who is a patient in the hospital and had a near syncopal episode.,  Labs revealed a hemoglobin of 7.5 with an MCV of 71.3 and platelets for 593,000 (baseline hemoglobin in 2021 was 12.9).  He described that about 2 to 4 weeks ago he was having black stools but it did not recur.  On chronic NSAIDs for chronic pain syndrome.  Associated symptoms include epigastric pain and reflux.  No PPI.    Today, the patient tells me that a month ago he did have about 3 days of black tarry looking stool, at first this was quite a lot but then seemed to decrease with the days.  He had not noticed any since then.  He was upstairs visiting his daughter who recently had brain surgery to remove a cyst and had a syncopal episode.  He was standing for a long period of time and then all of a sudden ended up on the floor.  They brought him to the ED as above.  Otherwise patient had been feeling fairly though he did note some palpitations and may be some chest tightness/indigestion here and there for which he had been taking Tums prn.  Describes being on Ibuprofen high-dose for years given his hip issues of bursitis.  He has never been on a PPI or had any previous issues with reflux.    Denies fever, chills, weight loss, nausea or vomiting.  GI history: 03/08/2020 colonoscopy Dr.  Ardis Hughs one 4 mm polyp in the transverse colon, mild inflammation in the distal rectum and small internal hemorrhoids; pathology showed tubular adenoma and prolapse type tissue in the rectum-repeat recommended in 7 years  Past Medical History:  Diagnosis Date   Arthritis    Bleeding per rectum 08/2013   "black and bright red" (08/14/2013)   Hypercholesteremia    Hypertension    Obesity    Prostate cancer ([place]) 2011   Unilateral primary osteoarthritis, right hip 04/23/2017    Past Surgical History:  Procedure Laterality Date   ESOPHAGOGASTRODUODENOSCOPY N/A 08/15/2013   Procedure: ESOPHAGOGASTRODUODENOSCOPY (EGD);  Surgeon: Milus Banister, MD;  Location: [place];  Service: Endoscopy;  Laterality: N/A;   INGUINAL HERNIA REPAIR Bilateral ~ 2004   ROBOT ASSISTED LAPAROSCOPIC RADICAL PROSTATECTOMY  08/2009   [name] 08/14/2009 (08/14/2013)   TOTAL HIP ARTHROPLASTY Left 11/14/2013   Procedure: LEFT TOTAL HIP ARTHROPLASTY ANTERIOR APPROACH;  Surgeon: Mcarthur Rossetti, MD;  Location: [place];  Service: Orthopedics;  Laterality: Left;   TOTAL HIP ARTHROPLASTY Right 04/23/2017   Procedure: RIGHT TOTAL HIP ARTHROPLASTY ANTERIOR APPROACH;  Surgeon: Mcarthur Rossetti, MD;  Location: [facility];  Service: Orthopedics;  Laterality: Right;    Family History  Problem Relation Age of Onset   Diabetes Sister    Diabetes Mother    Breast cancer  Mother    Colon cancer Neg Hx    Stomach cancer Neg Hx    Esophageal cancer Neg Hx    Pancreatic cancer Neg Hx     Social History   Tobacco Use   Smoking status: Never   Smokeless tobacco: Never  Vaping Use   Vaping Use: Never used  Substance Use Topics   Alcohol use: Yes    Comment: 08/14/2013 "might have a drink q other holiday"   Drug use: No    Prior to Admission medications   Medication Sig Start Date End Date Taking? Authorizing Provider  aspirin 325 MG EC tablet Take 1 tablet (325 mg total) by mouth 2 (two) times daily after a meal. Patient  taking differently: Take 325 mg by mouth daily. 04/24/17  Yes Mcarthur Rossetti, MD  cyclobenzaprine (FLEXERIL) 10 MG tablet Take 1 tablet (10 mg total) by mouth 3 (three) times daily as needed for muscle spasms (hip tightness). 09/21/22  Yes Lovorn, Jinny Blossom, MD  diclofenac (VOLTAREN) 75 MG EC tablet Take 1 tablet (75 mg total) by mouth daily as needed. Patient taking differently: Take 75 mg by mouth daily as needed for moderate pain. 09/21/22  Yes Lovorn, Jinny Blossom, MD  irbesartan-hydrochlorothiazide (AVALIDE) 300-12.5 MG tablet Take 1 tablet by mouth daily. 11/03/19  Yes [provider]  Multiple Vitamin (MULTIVITAMIN WITH MINERALS) TABS tablet Take 1 tablet by mouth daily.   Yes [provider]  nabumetone (RELAFEN) 750 MG tablet Take 1 tablet by mouth twice daily as needed Patient taking differently: Take 750 mg by mouth daily as needed for moderate pain. 05/06/20  Yes Mcarthur Rossetti, MD  simvastatin (ZOCOR) 40 MG tablet Take 40 mg by mouth daily. 08/09/19  Yes [provider]    Current Facility-Administered Medications  Medication Dose Route Frequency Provider Last Rate Last Admin   0.9 %  sodium chloride infusion (Manually program via Guardrails IV Fluids)   Intravenous Once Suella Broad A, PA-C       0.9 %  sodium chloride infusion   Intravenous Continuous Samella Parr, NP 75 mL/hr at 09/30/22 0749 New Bag at 09/30/22 0749   acetaminophen (TYLENOL) tablet 650 mg  650 mg Oral Q6H PRN Samella Parr, NP       Or   acetaminophen (TYLENOL) suppository 650 mg  650 mg Rectal Q6H PRN Samella Parr, NP       methocarbamol (ROBAXIN) injection 500 mg  500 mg Intravenous Q6H PRN Samella Parr, NP       morphine (PF) 2 MG/ML injection 1 mg  1 mg Intravenous Q2H PRN Samella Parr, NP       ondansetron (ZOFRAN) tablet 4 mg  4 mg Oral Q6H PRN Samella Parr, NP       Or   ondansetron [facility]) injection 4 mg  4 mg Intravenous Q6H PRN Samella Parr, NP        pantoprazole (PROTONIX) injection 40 mg  40 mg Intravenous Q12H Samella Parr, NP       sodium chloride flush (NS) 0.9 % injection 3 mL  3 mL Intravenous Q12H Samella Parr, NP       Current Outpatient Medications  Medication Sig Dispense Refill   aspirin 325 MG EC tablet Take 1 tablet (325 mg total) by mouth 2 (two) times daily after a meal. (Patient taking differently: Take 325 mg by mouth daily.) 40 tablet 0   cyclobenzaprine (FLEXERIL) 10 MG tablet Take  1 tablet (10 mg total) by mouth 3 (three) times daily as needed for muscle spasms (hip tightness). 60 tablet 11   diclofenac (VOLTAREN) 75 MG EC tablet Take 1 tablet (75 mg total) by mouth daily as needed. (Patient taking differently: Take 75 mg by mouth daily as needed for moderate pain.) 35 tablet 11   irbesartan-hydrochlorothiazide (AVALIDE) 300-12.5 MG tablet Take 1 tablet by mouth daily.     Multiple Vitamin (MULTIVITAMIN WITH MINERALS) TABS tablet Take 1 tablet by mouth daily.     nabumetone (RELAFEN) 750 MG tablet Take 1 tablet by mouth twice daily as needed (Patient taking differently: Take 750 mg by mouth daily as needed for moderate pain.) 60 tablet 0   simvastatin (ZOCOR) 40 MG tablet Take 40 mg by mouth daily.      Allergies as of 09/29/2022   (No Known Allergies)     Review of Systems:    Constitutional: No weight loss, fever or chills Skin: No rash  Cardiovascular: No chest pain  Respiratory: No SOB Gastrointestinal: See HPI and otherwise negative Genitourinary: No dysuria  Neurological:+syncope Musculoskeletal: No new muscle or joint pain Hematologic: No bleeding  Psychiatric: No history of depression or anxiety    Physical Exam:  Vital signs in last 24 hours: Temp:  [97.9 F (36.6 C)-98.2 F (36.8 C)] 98.2 F (36.8 C) (02/21 0551) Pulse Rate:  [72-82] 72 (02/21 0552) Resp:  [16-20] 18 (02/21 0830) BP: (120-161)/(56-86) 139/72 (02/21 0830) SpO2:  [93 %-100 %] 100 % (02/21 0552) Weight:  [102.5  kg] 102.5 kg (02/20 1943)   General:   Pleasant AA male appears to be in NAD, Well developed, Well nourished, alert and cooperative Head:  Normocephalic and atraumatic. Eyes:   PEERL, EOMI. No icterus. Conjunctiva pink. Ears:  Normal auditory acuity. Neck:  Supple Throat: Oral cavity and pharynx without inflammation, swelling or lesion. Teeth in good condition. Lungs: Respirations even and unlabored. Lungs clear to auscultation bilaterally.   No wheezes, crackles, or rhonchi.  Heart: Normal S1, S2. +MURMUR Regular rate and rhythm. No peripheral edema, cyanosis or pallor.  Abdomen:  Soft, nondistended, nontender. No rebound or guarding. Normal bowel sounds. No appreciable masses or hepatomegaly. Rectal:  Not performed.  Msk:  Symmetrical without gross deformities. Peripheral pulses intact.  Extremities:  Without edema, no deformity or joint abnormality.  Neurologic:  Alert and  oriented x4;  grossly normal neurologically.  Skin:   Dry and intact without significant lesions or rashes. Psychiatric: Demonstrates good judgement and reason without abnormal affect or behaviors.   LAB RESULTS: Recent Labs    09/29/22 1949  WBC 7.4  HGB 7.5*  HCT 26.1*  PLT 593*   BMET Recent Labs    09/29/22 1949  NA 136  K 3.9  CL 101  CO2 24  GLUCOSE 100*  BUN 18  CREATININE 1.16  CALCIUM 9.0   LFT Recent Labs    09/29/22 1949  PROT 7.4  ALBUMIN 3.4*  AST 22  ALT 21  ALKPHOS 60  BILITOT 0.3    STUDIES: DG Chest 2 View  Result Date: 09/29/2022 CLINICAL DATA:  Diaphoresis, syncope, left-sided chest discomfort EXAM: CHEST - 2 VIEW COMPARISON:  03/18/2007 FINDINGS: Frontal and lateral views of the chest demonstrate an unremarkable cardiac silhouette. No airspace disease, effusion, or pneumothorax. Focal eventration right hemidiaphragm. No acute bony abnormalities. IMPRESSION: 1. No acute intrathoracic process. Electronically Signed   By: Randa Ngo M.D.   On: 09/29/2022 20:32  Impression / Plan:   Impression: 1.  Symptomatic anemia: Hemoglobin down to 7.5 (baseline from 2021 around 12.9), patient with some palpitations/indigestion and episode of syncope yesterday, history of NSAID use for hip bursitis; most likely PUD 2.  Syncopal episode  Plan: 1.  Scheduled patient for an EGD today with Dr. Tarri Glenn.  Did discuss risk, benefits, limitations and alternatives and the patient agrees to proceed. 2.  Agree with Pantoprazole 40 mg IV twice daily 3.  Continue Zofran as needed 4.  Continue to monitor hemoglobin and transfusion as needed less than 7. 5.  Please await further recommendations from Dr. Tarri Glenn after time of procedure.  Patient will remain n.p.o. until then.  Thank you for your kind consultation, we will continue to follow.  Lavone Nian Roosevelt Eimers  09/30/2022, 9:22 AM

## 2022-09-30 NOTE — Anesthesia Procedure Notes (Signed)
Procedure Name: MAC Date/Time: 09/30/2022 10:58 AM  Performed by: Mariea Clonts, CRNAPre-anesthesia Checklist: Patient identified, Emergency Drugs available, Suction available, Patient being monitored and Timeout performed Patient Re-evaluated:Patient Re-evaluated prior to induction Oxygen Delivery Method: Nasal cannula

## 2022-09-30 NOTE — Transfer of Care (Signed)
Immediate Anesthesia Transfer of Care Note  Patient: Lance Rollins  Procedure(s) Performed: ESOPHAGOGASTRODUODENOSCOPY (EGD) WITH PROPOFOL BIOPSY  Patient Location: PACU  Anesthesia Type:MAC  Level of Consciousness: awake, alert , and oriented  Airway & Oxygen Therapy: Patient Spontanous Breathing and Patient connected to nasal cannula oxygen  Post-op Assessment: Report given to RN and Post -op Vital signs reviewed and stable  Post vital signs: Reviewed and stable  Last Vitals:  Vitals Value Taken Time  BP    Temp    Pulse 82 09/30/22 1120  Resp 25 09/30/22 1120  SpO2 99 % 09/30/22 1120  Vitals shown include unvalidated device data.  Last Pain:  Vitals:   09/30/22 1034  TempSrc: Temporal  PainSc: 0-No pain      Patients Stated Pain Goal: 0 ([id number])  Complications: No notable events documented.

## 2022-09-30 NOTE — H&P (Signed)
History and Physical    Patient: Lance Rollins [id number] DOB: 06-24-1946 DOA: 09/29/2022 DOS: the patient was seen and examined on 09/30/2022 PCP: Lawerance Cruel, MD  Patient coming from: Home  Chief Complaint:  Chief Complaint  Patient presents with   Syncopal Episode   HPI: Lance Rollins is a 77 y.o. male with medical history significant of hypertension, dyslipidemia, obesity, chronic pain secondary to bilateral hip bursitis/trochanteric bursitis, history of prostatic adenocarcinoma status post radical prostatectomy in 2011 and a history of bright red blood per rectum in 2015.  Patient had his last screening colonoscopy with Dr. Ardis Hughs in 2021 and was found to have tubular adenoma and recommendation was made to follow-up again in 7 years.  Patient presented to the ED after having a near syncopal episode while visiting his daughter who is a patient in the hospital.  Upon initial evaluation in the ER he was afebrile with a BP of 121/56, he was not hypoxic he was not tachycardic and had a normal respiratory rate.  Labs revealed a hemoglobin of 7.5 with an MCV of 71.3 and platelets 593,000.  Baseline hemoglobin in 2021 was 12.9.  Patient was felt to have symptomatic anemia and request was made for the hospitalist to admit the patient.  Patient does report about 2 to 4 weeks ago he had black stools which has not recurred.  He is on chronic NSAIDs for his chronic pain syndrome.  He has been having epigastric pain and reflux.  He was not on PPI or H2 blockers prior to admission.  He has not had any changes in his medications.  He has not had any bright red emesis or bleeding from rectum.  He states he has been given 1 unit of PRBCs blood this hospitalization.  Orthostatic vital signs were checked around 1 AM and were normal.  Review of Systems: As mentioned in the history of present illness. All other systems reviewed and are negative. Past Medical History:  Diagnosis Date   Arthritis     Bleeding per rectum 08/2013   "black and bright red" (08/14/2013)   Hypercholesteremia    Hypertension    Obesity    Prostate cancer ([place]) 2011   Unilateral primary osteoarthritis, right hip 04/23/2017   Past Surgical History:  Procedure Laterality Date   ESOPHAGOGASTRODUODENOSCOPY N/A 08/15/2013   Procedure: ESOPHAGOGASTRODUODENOSCOPY (EGD);  Surgeon: Milus Banister, MD;  Location: [place];  Service: Endoscopy;  Laterality: N/A;   INGUINAL HERNIA REPAIR Bilateral ~ 2004   ROBOT ASSISTED LAPAROSCOPIC RADICAL PROSTATECTOMY  08/2009   [name] 08/14/2009 (08/14/2013)   TOTAL HIP ARTHROPLASTY Left 11/14/2013   Procedure: LEFT TOTAL HIP ARTHROPLASTY ANTERIOR APPROACH;  Surgeon: Mcarthur Rossetti, MD;  Location: [place];  Service: Orthopedics;  Laterality: Left;   TOTAL HIP ARTHROPLASTY Right 04/23/2017   Procedure: RIGHT TOTAL HIP ARTHROPLASTY ANTERIOR APPROACH;  Surgeon: Mcarthur Rossetti, MD;  Location: [facility];  Service: Orthopedics;  Laterality: Right;   Social History:  reports that he has never smoked. He has never used smokeless tobacco. He reports current alcohol use. He reports that he does not use drugs.  No Known Allergies  Family History  Problem Relation Age of Onset   Diabetes Sister    Diabetes Mother    Breast cancer Mother    Colon cancer Neg Hx    Stomach cancer Neg Hx    Esophageal cancer Neg Hx    Pancreatic cancer Neg Hx     Prior to Admission medications  Medication Sig Start Date End Date Taking? Authorizing Provider  aspirin 325 MG EC tablet Take 1 tablet (325 mg total) by mouth 2 (two) times daily after a meal. Patient taking differently: Take 325 mg by mouth daily. 04/24/17  Yes Mcarthur Rossetti, MD  cyclobenzaprine (FLEXERIL) 10 MG tablet Take 1 tablet (10 mg total) by mouth 3 (three) times daily as needed for muscle spasms (hip tightness). 09/21/22  Yes Lovorn, Jinny Blossom, MD  diclofenac (VOLTAREN) 75 MG EC tablet Take 1 tablet (75 mg total) by mouth  daily as needed. Patient taking differently: Take 75 mg by mouth daily as needed for moderate pain. 09/21/22  Yes Lovorn, Jinny Blossom, MD  irbesartan-hydrochlorothiazide (AVALIDE) 300-12.5 MG tablet Take 1 tablet by mouth daily. 11/03/19  Yes [provider]  Multiple Vitamin (MULTIVITAMIN WITH MINERALS) TABS tablet Take 1 tablet by mouth daily.   Yes [provider]  nabumetone (RELAFEN) 750 MG tablet Take 1 tablet by mouth twice daily as needed Patient taking differently: Take 750 mg by mouth daily as needed for moderate pain. 05/06/20  Yes Mcarthur Rossetti, MD  simvastatin (ZOCOR) 40 MG tablet Take 40 mg by mouth daily. 08/09/19  Yes [provider]    Physical Exam: Vitals:   09/30/22 0530 09/30/22 0551 09/30/22 0552 09/30/22 0630  BP: (!) 146/76 (!) 161/86 (!) 161/86 121/77  Pulse:  72 72   Resp: 17 16 16 19  $ Temp:  98.2 F (36.8 C)    TempSrc:  Oral    SpO2:   100%   Weight:      Height:       Constitutional: NAD, calm, comfortable Eyes: PERRL, lids and conjunctivae slightly pale. Respiratory: clear to auscultation bilaterally, no wheezing, no crackles. Normal respiratory effort. No accessory muscle use.  Cardiovascular: Regular rate and rhythm, no murmurs / rubs / gallops. No extremity edema. 2+ pedal pulses.  Abdomen: no tenderness, no masses palpated. No hepatosplenomegaly. Bowel sounds positive.  Musculoskeletal: no clubbing / cyanosis. No joint deformity upper and lower extremities. Good ROM, no contractures. Normal muscle tone.  Skin: no rashes, lesions, ulcers. No induration Neurologic: CN 2-12 grossly intact. Sensation intact, Strength 5/5 x all 4 extremities.  Psychiatric: Normal judgment and insight. Alert and oriented x 3. Normal mood.    Data Reviewed:  As per HPI. Anemia panel demonstrates an iron level of 20, normal UIBC, slightly elevated TIBC of 511, low saturation 4 low ferritin of 7.  Electrolyte panel unremarkable with normal renal  function, LFTs and slightly low sodium 136 and potassium 3.9.  Troponin normal.  Posttransfusion CBC pending Initial fecal occult blood was negative  Assessment and Plan: Symptomatic iron deficiency anemia Baseline hemoglobin as of 2021 between 11.9 and 12 with presenting hemoglobin 7.5 Has been given 1 unit of PRBC since admission with follow-up hemoglobin pending Likely will need iron replacement-consider IV and definitely will need at least short-term oral iron replacement after discharge  Suspected occult GI bleed Patient with history of black stools about 2 to 4 weeks prior to presentation with heavy NSAID use in context of chronic pain GI has been consulted NSAIDs on hold Protonix IV every 12 hrs Initial FOB negative but will continue to trend Anticipate normal need EGD therefore leave n.p.o. until evaluated by GI  Hypertension BP currently stable without any significant elevations in blood pressure Will hold antihypertensive given symptomatic anemia at presentation in context of suspected occult GI bleeding Home medications: Irbesartan with HCTZ  Dyslipidemia On Zocor  at home-holding until diet advanced  Chronic pain secondary to bilateral trochanteric bursitis Followed by [facility] as an outpatient Chronic medications include Flexeril, diclofenac and Relafen Will utilize IV Robaxin till seen by GI  Obesity BMI 35.4 According to most recent PMR note patient has lost 25 pounds  History of prostate cancer status post radical prostatectomy 2011 Patient does not report any issues with voiding difficulties    Advance Care Planning:   Code Status: Full Code   DVT prophylaxis: SCDs  Consults: [place] [facility]  Family Communication: Patient only  Severity of Illness: The appropriate patient status for this patient is OBSERVATION. Observation status is judged to be reasonable and necessary in order to provide the required intensity of service to ensure the  patient's safety. The patient's presenting symptoms, physical exam findings, and initial radiographic and laboratory data in the context of their medical condition is felt to place them at decreased risk for further clinical deterioration. Furthermore, it is anticipated that the patient will be medically stable for discharge from the hospital within 2 midnights of admission.   Author: Erin Hearing, NP 09/30/2022 6:56 AM  For on call review [email or web address].

## 2022-09-30 NOTE — Anesthesia Preprocedure Evaluation (Addendum)
Anesthesia Evaluation  Patient identified by MRN, date of birth, ID band Patient awake    Reviewed: Allergy & Precautions, H&P , NPO status , Patient's Chart, lab work & pertinent test results  Airway Mallampati: II  TM Distance: >3 FB Neck ROM: Full    Dental no notable dental hx. (+) Dental Advisory Given, Missing,    Pulmonary    Pulmonary exam normal breath sounds clear to auscultation       Cardiovascular hypertension, Pt. on medications Normal cardiovascular exam Rhythm:Regular Rate:Normal + Systolic murmurs    Neuro/Psych    GI/Hepatic   Endo/Other    Renal/GU      Musculoskeletal  (+) Arthritis ,    Abdominal  (+) + obese  Peds  Hematology  (+) Blood dyscrasia, anemia   Anesthesia Other Findings H/o prostate ca  Reproductive/Obstetrics                             Anesthesia Physical Anesthesia Plan  ASA: 3  Anesthesia Plan: MAC   Post-op Pain Management: Minimal or no pain anticipated   Induction:   PONV Risk Score and Plan: 2 and Ondansetron, Propofol infusion and Treatment may vary due to age or medical condition  Airway Management Planned: Natural Airway  Additional Equipment:   Intra-op Plan:   Post-operative Plan:   Informed Consent: I have reviewed the patients History and Physical, chart, labs and discussed the procedure including the risks, benefits and alternatives for the proposed anesthesia with the patient or authorized representative who has indicated his/her understanding and acceptance.     Dental advisory given  Plan Discussed with: CRNA  Anesthesia Plan Comments: (  )        Anesthesia Quick Evaluation

## 2022-10-01 ENCOUNTER — Observation Stay (HOSPITAL_BASED_OUTPATIENT_CLINIC_OR_DEPARTMENT_OTHER): Payer: Medicare Other | Admitting: Anesthesiology

## 2022-10-01 ENCOUNTER — Encounter (HOSPITAL_COMMUNITY): Payer: Self-pay | Admitting: Internal Medicine

## 2022-10-01 ENCOUNTER — Observation Stay (HOSPITAL_COMMUNITY): Payer: Medicare Other | Admitting: Anesthesiology

## 2022-10-01 ENCOUNTER — Encounter (HOSPITAL_COMMUNITY)
Admission: EM | Disposition: A | Discharge status: Home / Self Care | Payer: Self-pay | Source: Home / Self Care | Attending: Internal Medicine

## 2022-10-01 DIAGNOSIS — M1990 Unspecified osteoarthritis, unspecified site: Secondary | ICD-10-CM

## 2022-10-01 DIAGNOSIS — K648 Other hemorrhoids: Secondary | ICD-10-CM | POA: Diagnosis not present

## 2022-10-01 DIAGNOSIS — I10 Essential (primary) hypertension: Secondary | ICD-10-CM

## 2022-10-01 DIAGNOSIS — K922 Gastrointestinal hemorrhage, unspecified: Secondary | ICD-10-CM

## 2022-10-01 DIAGNOSIS — D649 Anemia, unspecified: Secondary | ICD-10-CM | POA: Diagnosis not present

## 2022-10-01 DIAGNOSIS — K5731 Diverticulosis of large intestine without perforation or abscess with bleeding: Secondary | ICD-10-CM

## 2022-10-01 DIAGNOSIS — D500 Iron deficiency anemia secondary to blood loss (chronic): Secondary | ICD-10-CM | POA: Diagnosis not present

## 2022-10-01 HISTORY — PX: GIVENS CAPSULE STUDY: SHX5432

## 2022-10-01 HISTORY — PX: COLONOSCOPY WITH PROPOFOL: SHX5780

## 2022-10-01 LAB — BASIC METABOLIC PANEL
Anion gap: 10 (ref 5–15)
BUN: 11 mg/dL (ref 8–23)
CO2: 21 mmol/L — ABNORMAL LOW (ref 22–32)
Calcium: 8.7 mg/dL — ABNORMAL LOW (ref 8.9–10.3)
Chloride: 104 mmol/L (ref 98–111)
Creatinine, Ser: 0.96 mg/dL (ref 0.61–1.24)
GFR, Estimated: >60 mL/min (ref >60)
Glucose, Bld: 92 mg/dL (ref 70–99)
Potassium: 3.7 mmol/L (ref 3.5–5.1)
Sodium: 135 mmol/L (ref 135–145)

## 2022-10-01 LAB — CBC
HCT: 27.5 % — ABNORMAL LOW (ref 39.0–52.0)
Hemoglobin: 8.2 g/dL — ABNORMAL LOW (ref 13.0–17.0)
MCH: 21.2 pg — ABNORMAL LOW (ref 26.0–34.0)
MCHC: 29.8 g/dL — ABNORMAL LOW (ref 30.0–36.0)
MCV: 71.1 fL — ABNORMAL LOW (ref 80.0–100.0)
Platelets: 497 10*3/uL — ABNORMAL HIGH (ref 150–400)
RBC: 3.87 MIL/uL — ABNORMAL LOW (ref 4.22–5.81)
RDW: 19.5 % — ABNORMAL HIGH (ref 11.5–15.5)
WBC: 9.9 10*3/uL (ref 4.0–10.5)
nRBC: 0 % (ref 0.0–0.2)

## 2022-10-01 LAB — BPAM RBC
Blood Product Expiration Date: 202402272359
ISSUE DATE / TIME: 202402210232
Unit Type and Rh: 1700

## 2022-10-01 LAB — TYPE AND SCREEN
ABO/RH(D): B POS
Antibody Screen: NEGATIVE
Unit division: 0

## 2022-10-01 LAB — MAGNESIUM: Magnesium: 1.9 mg/dL (ref 1.7–2.4)

## 2022-10-01 SURGERY — IMAGING PROCEDURE, GI TRACT, INTRALUMINAL, VIA CAPSULE

## 2022-10-01 SURGERY — COLONOSCOPY WITH PROPOFOL
Anesthesia: Monitor Anesthesia Care

## 2022-10-01 MED ORDER — LIDOCAINE 2% (20 MG/ML) 5 ML SYRINGE
INTRAMUSCULAR | Status: DC | PRN
  Administered 2022-10-01: 100 mg via INTRAVENOUS

## 2022-10-01 MED ORDER — SODIUM CHLORIDE 0.9 % IV SOLN: INTRAVENOUS | Status: DC | PRN

## 2022-10-01 MED ORDER — SODIUM CHLORIDE 0.9 % IV SOLN
250.0000 mg | Freq: Every day | INTRAVENOUS | Status: DC
  Administered 2022-10-01 – 2022-10-02 (×2): 250 mg via INTRAVENOUS
  Filled 2022-10-01 (×2): qty 20

## 2022-10-01 MED ORDER — GLYCOPYRROLATE 0.2 MG/ML IJ SOLN
INTRAMUSCULAR | Status: DC | PRN
  Administered 2022-10-01: .2 mg via INTRAVENOUS

## 2022-10-01 MED ORDER — PROPOFOL 500 MG/50ML IV EMUL
INTRAVENOUS | Status: DC | PRN
  Administered 2022-10-01: 100 ug/kg/min via INTRAVENOUS

## 2022-10-01 SURGICAL SUPPLY — 22 items

## 2022-10-01 NOTE — Progress Notes (Signed)
PROGRESS NOTE    Lance Rollins  [id number] DOB: February 09, 1946 DOA: 09/29/2022 PCP: Lawerance Cruel, MD   Brief Narrative:  77 y.o. male with medical history significant of hypertension, dyslipidemia, obesity, chronic pain secondary to bilateral hip bursitis/trochanteric bursitis, history of prostatic adenocarcinoma status post radical prostatectomy in 2011 and a rectal bleeding in 2015 presented with syncopal episode along with intermittent black stools for 2 to 4 weeks.  On presentation, hemoglobin was 7.5 (baseline hemoglobin of 12.9 in 2021).  He was transfused 1 unit packed red cells and started on IV Protonix.  GI was consulted.  Assessment & Plan:   Symptomatic iron deficiency anemia Possible occult GI bleeding -presented with syncopal episode along with intermittent black stools for 2 to 4 weeks.  On presentation, hemoglobin was 7.5 (baseline hemoglobin of 12.9 in 2021).  He was transfused 1 unit packed red cells and started on IV Protonix.   -GI following: Status post EGD on 09/30/2022 which showed erythematous mucosa in the antrum.  GI planning for colonoscopy today. -Hemoglobin 8.2 today.  Monitor H&H. -Will need iron supplementation as well.  Syncope -Possibly from above.  PT eval.  Continue telemetry monitoring.  No further syncope since admission.  Thrombocytosis -Possibly reactive.  Hypertension -Monitor blood pressure.  Antihypertensives on hold  Hyperlipidemia--resume Zocor on discharge  Chronic pain secondary to bilateral trochanteric bursitis -Followed by: PMR as an outpatient.  Obesity -Outpatient follow-up  History of prostate cancer status post radical prostatectomy in 2011 -Outpatient follow-up with urology     DVT prophylaxis: SCDs Code Status: Full Family Communication: None at bedside Disposition Plan: Status is: Observation The patient will require care spanning > 2 midnights and should be moved to inpatient because: Of need for colonoscopy  today  Consultants: GI  Procedures:  EGD on 09/30/2022 Impression:               - Normal esophagus.                           - Erythematous mucosa in the antrum. Biopsied.                           - Normal examined duodenum. Biopsied.                           - No obvious source for symptomatic iron deficiency                            anemia. Recommendation:           - Patient has a contact number available for                            emergencies. The signs and symptoms of potential                            delayed complications were discussed with the                            patient. Return to normal activities tomorrow.  Written discharge instructions were provided to the                            patient.                           - Clear liquid diet today with plans for bowel prep                            tonight.                           - Continue present medications.                           - May reduce pantoprazole to 40 mg daily for at                            least 8 weeks given the gastritis.                           - Await pathology results.                           - No aspirin, ibuprofen, naproxen, or other                            non-steroidal anti-inflammatory drugs.                           - Colonoscopy 10/01/22.                           - The findings and recommendations were discussed                            with the patient.  Antimicrobials: None   Subjective: Patient seen and examined at bedside.  No overnight fever, vomiting, shortness of breath reported.  Objective: Vitals:   10/01/22 0416 10/01/22 0752 10/01/22 0805 10/01/22 0947  BP: 126/66 132/79  (!) 145/72  Pulse: 81   86  Resp: 20 19  (!) 22  Temp: 97.8 F (36.6 C) 97.8 F (36.6 C)    TempSrc: Oral Oral    SpO2: 97%  99% 98%  Weight: 97.7 kg     Height:        Intake/Output Summary (Last 24 hours) at 10/01/2022 1046 Last data  filed at 09/30/2022 1901 Gross per 24 hour  Intake 1031.98 ml  Output --  Net 1031.98 ml   Filed Weights   09/29/22 1943 10/01/22 0416  Weight: 102.5 kg 97.7 kg    Examination:  General exam: Appears calm and comfortable.  On room air. Respiratory system: Bilateral decreased breath sounds at bases, mild intermittent tachypnea present Cardiovascular system: S1 & S2 heard, Rate controlled Gastrointestinal system: Abdomen is obese, nondistended, soft and nontender. Normal bowel sounds heard. Extremities: No cyanosis, clubbing, edema  Central nervous system: Alert and oriented. No focal neurological deficits. Moving extremities Skin: No rashes, lesions or ulcers Psychiatry: Judgement and insight appear  normal. Mood & affect appropriate.     Data Reviewed: I have personally reviewed following labs and imaging studies  CBC: Recent Labs  Lab 09/29/22 1949 09/30/22 0912 10/01/22 0024  WBC 7.4 8.8 9.9  NEUTROABS 3.7 5.3  --   HGB 7.5* 8.9* 8.2*  HCT 26.1* 29.2* 27.5*  MCV 71.3* 71.7* 71.1*  PLT 593* 509* [id number]*   Basic Metabolic Panel: Recent Labs  Lab 09/29/22 1949 10/01/22 0024  NA 136 135  K 3.9 3.7  CL 101 104  CO2 24 21*  GLUCOSE 100* 92  BUN 18 11  CREATININE 1.16 0.96  CALCIUM 9.0 8.7*  MG  --  1.9   GFR: Estimated Creatinine Clearance: 75.1 mL/min (by C-G formula based on SCr of 0.96 mg/dL). Liver Function Tests: Recent Labs  Lab 09/29/22 1949  AST 22  ALT 21  ALKPHOS 60  BILITOT 0.3  PROT 7.4  ALBUMIN 3.4*   No results for input(s): "LIPASE", "AMYLASE" in the last 168 hours. No results for input(s): "AMMONIA" in the last 168 hours. Coagulation Profile: No results for input(s): "INR", "PROTIME" in the last 168 hours. Cardiac Enzymes: No results for input(s): "CKTOTAL", "CKMB", "CKMBINDEX", "TROPONINI" in the last 168 hours. BNP (last 3 results) No results for input(s): "PROBNP" in the last 8760 hours. HbA1C: No results for input(s): "HGBA1C" in  the last 72 hours. CBG: Recent Labs  Lab 09/29/22 2004  GLUCAP 102*   Lipid Profile: No results for input(s): "CHOL", "HDL", "LDLCALC", "TRIG", "CHOLHDL", "LDLDIRECT" in the last 72 hours. Thyroid Function Tests: No results for input(s): "TSH", "T4TOTAL", "FREET4", "T3FREE", "THYROIDAB" in the last 72 hours. Anemia Panel: Recent Labs    09/30/22 0230  VITAMINB12 635  FOLATE 25.6  FERRITIN 7*  TIBC 511*  IRON 20*  RETICCTPCT 1.0   Sepsis Labs: No results for input(s): "PROCALCITON", "LATICACIDVEN" in the last 168 hours.  No results found for this or any previous visit (from the past 240 hour(s)).       Radiology Studies: DG Chest 2 View  Result Date: 09/29/2022 CLINICAL DATA:  Diaphoresis, syncope, left-sided chest discomfort EXAM: CHEST - 2 VIEW COMPARISON:  03/18/2007 FINDINGS: Frontal and lateral views of the chest demonstrate an unremarkable cardiac silhouette. No airspace disease, effusion, or pneumothorax. Focal eventration right hemidiaphragm. No acute bony abnormalities. IMPRESSION: 1. No acute intrathoracic process. Electronically Signed   By: Randa Ngo M.D.   On: 09/29/2022 20:32        Scheduled Meds:  [id number] Hold] sodium chloride   Intravenous Once   [MAR Hold] pantoprazole (PROTONIX) IV  40 mg Intravenous Q12H   [MAR Hold] sodium chloride flush  3 mL Intravenous Q12H   Continuous Infusions:  sodium chloride Stopped (10/01/22 0536)   sodium chloride 20 mL/hr at 10/01/22 0953          Aline August, MD [facility] 10/01/2022, 10:46 AM

## 2022-10-01 NOTE — Op Note (Signed)
[facility] Patient Name: Lance Rollins Procedure Date : 10/01/2022 MRN: [id number] Attending MD: Thornton Park MD, MD, [id number] Date of Birth: 1945-12-07 CSN: [id number] Age: 77 Admit Type: Inpatient Procedure:                Colonoscopy Indications:              Gastrointestinal bleeding not explained by EGD                            yesterday                           Prior colonoscopies with Dr. Ardis Hughs in 2015, 2018,                            and 2021 revealed small tubular adenomas Providers:                Thornton Park MD, MD, Fanny Skates RN, RN,                            Frazier Richards, Technician Referring MD:              Medicines:                Monitored Anesthesia Care Complications:            No immediate complications. Estimated Blood Loss:     Estimated blood loss: none. Estimated blood loss:                            none. Procedure:                Pre-Anesthesia Assessment:                           - Prior to the procedure, a History and Physical                            was performed, and patient medications and                            allergies were reviewed. The patient's tolerance of                            previous anesthesia was also reviewed. The risks                            and benefits of the procedure and the sedation                            options and risks were discussed with the patient.                            All questions were answered, and informed consent                            was obtained. Prior Anticoagulants: The patient  has                            taken no anticoagulant or antiplatelet agents. ASA                            Grade Assessment: III - A patient with severe                            systemic disease. After reviewing the risks and                            benefits, the patient was deemed in satisfactory                            condition to undergo the procedure.                            After obtaining informed consent, the colonoscope                            was passed under direct vision. Throughout the                            procedure, the patient's blood pressure, pulse, and                            oxygen saturations were monitored continuously. The                            [id number] Olympus coloscope was                            introduced through the anus and advanced to the the                            cecum, identified by appendiceal orifice and                            ileocecal valve. A second forward view of the right                            colon was performed. The colonoscopy was performed                            without difficulty. The patient tolerated the                            procedure well. The quality of the bowel                            preparation was good. The ileocecal valve,  appendiceal orifice, and rectum were photographed. Scope In: 10:24:06 AM Scope Out: 10:42:05 AM Scope Withdrawal Time: 0 hours 12 minutes 19 seconds  Total Procedure Duration: 0 hours 17 minutes 59 seconds  Findings:      The perianal and digital rectal examinations were normal.      Multiple medium-mouthed and small-mouthed diverticula were found in the       entire colon.      Non-bleeding internal hemorrhoids were found. Impression:               - Diverticulosis in the entire examined colon.                           - No source of GI blood loss identified at this                            time. Recommendation:           - Return patient to hospital ward for ongoing care.                           - NPO in preparation for capsule endoscopy today.                            Patient was consented for possible capsule                            endoscopy yesterday in the event that the                            colonoscopy did not reveal a source of bleeding.                           -  Continue present medications.                           - Follow a high fiber diet. Drink at least 64                            ounces of water daily. Add a daily stool bulking                            agent such as psyllium (an exampled would be                            Metamucil).                           - No further surveillance colonoscopy recommended                            due to age. Procedure Code(s):        --- Professional ---                           [phone], Colonoscopy, flexible; diagnostic, including  collection of specimen(s) by brushing or washing,                            when performed (separate procedure) Diagnosis Code(s):        --- Professional ---                           K92.2, Gastrointestinal hemorrhage, unspecified                           K57.30, Diverticulosis of large intestine without                            perforation or abscess without bleeding CPT copyright 2022 [employer]. All rights reserved. The codes documented in this report are preliminary and upon coder review may  be revised to meet current compliance requirements. Thornton Park MD, MD 10/01/2022 11:15:42 AM This report has been signed electronically. Number of Addenda: 0

## 2022-10-01 NOTE — Transfer of Care (Signed)
Immediate Anesthesia Transfer of Care Note  Patient: Lance Rollins  Procedure(s) Performed: COLONOSCOPY WITH PROPOFOL  Patient Location: PACU, Endo  Anesthesia Type:MAC  Level of Consciousness: awake, alert , and oriented  Airway & Oxygen Therapy: Patient Spontanous Breathing  Post-op Assessment: Report given to RN and Post -op Vital signs reviewed and stable  Post vital signs: Reviewed and stable  Last Vitals:  Vitals Value Taken Time  BP 112/68 10/01/22 1050  Temp 36.6 C 10/01/22 1050  Pulse 94 10/01/22 1055  Resp 27 10/01/22 1055  SpO2 99 % 10/01/22 1055  Vitals shown include unvalidated device data.  Last Pain:  Vitals:   10/01/22 1050  TempSrc: Temporal  PainSc:       Patients Stated Pain Goal: 0 ([phone] [id number])  Complications: No notable events documented.

## 2022-10-01 NOTE — Anesthesia Preprocedure Evaluation (Signed)
Anesthesia Evaluation  Patient identified by MRN, date of birth, ID band Patient awake    Reviewed: Allergy & Precautions, NPO status , Patient's Chart, lab work & pertinent test results  Airway Mallampati: II  TM Distance: >3 FB Neck ROM: Full    Dental  (+) Missing,    Pulmonary neg pulmonary ROS   breath sounds clear to auscultation       Cardiovascular hypertension, Pt. on medications  Rhythm:Regular Rate:Normal     Neuro/Psych    GI/Hepatic negative GI ROS, Neg liver ROS,,,  Endo/Other  negative endocrine ROS    Renal/GU negative Renal ROS     Musculoskeletal  (+) Arthritis ,    Abdominal   Peds  Hematology negative hematology ROS (+)   Anesthesia Other Findings   Reproductive/Obstetrics                             Anesthesia Physical Anesthesia Plan  ASA: 2  Anesthesia Plan: MAC   Post-op Pain Management: Minimal or no pain anticipated   Induction: Intravenous  PONV Risk Score and Plan: 0  Airway Management Planned: Natural Airway and Simple Face Mask  Additional Equipment: None  Intra-op Plan:   Post-operative Plan:   Informed Consent: I have reviewed the patients History and Physical, chart, labs and discussed the procedure including the risks, benefits and alternatives for the proposed anesthesia with the patient or authorized representative who has indicated his/her understanding and acceptance.       Plan Discussed with: CRNA  Anesthesia Plan Comments:        Anesthesia Quick Evaluation

## 2022-10-01 NOTE — Progress Notes (Signed)
Pt swallowed pillcam with no issues. RN reviewed instructions regarding NPO/diet for the rest of the day; given printed instructions. Pt verbalized understanding. 2C RN called and instructions reviewed with her.

## 2022-10-01 NOTE — Plan of Care (Signed)
  Problem: Education: Goal: Knowledge of General Education information will improve Description: Including pain rating scale, medication(s)/side effects and non-pharmacologic comfort measures Outcome: Progressing   Problem: Health Behavior/Discharge Planning: Goal: Ability to manage health-related needs will improve Outcome: Progressing   Problem: Clinical Measurements: Goal: Will remain free from infection Outcome: Progressing Goal: Cardiovascular complication will be avoided Outcome: Progressing   Problem: Activity: Goal: Risk for activity intolerance will decrease Outcome: Progressing   Problem: Nutrition: Goal: Adequate nutrition will be maintained Outcome: Progressing   Problem: Elimination: Goal: Will not experience complications related to bowel motility Outcome: Progressing Goal: Will not experience complications related to urinary retention Outcome: Progressing

## 2022-10-01 NOTE — Progress Notes (Signed)
Capsule study complete.  Belt removed and placed on counter next to closet.  Pt will NPO after midnight.  Pt aware and agreeable.

## 2022-10-01 NOTE — TOC Progression Note (Signed)
Transition of Care [facility]) - Progression Note    Patient Details  Name: Lance Rollins MRN: [id number] Date of Birth: Aug 02, 1946  [facility] CM/SW Contact  Zenon Mayo, RN Phone Number: 10/01/2022, 4:07 PM  Clinical Narrative:    from home, syncope, for colonoscopy today.  TOC following.        Expected Discharge Plan and Services                                               Social Determinants of Health (SDOH) Interventions SDOH Screenings   Food Insecurity: No Food Insecurity (09/30/2022)  Housing: Low Risk  (09/30/2022)  Transportation Needs: No Transportation Needs (09/30/2022)  Utilities: Not At Risk (09/30/2022)  Depression (PHQ2-9): Low Risk  (09/21/2022)  Tobacco Use: Low Risk  (10/01/2022)    Readmission Risk Interventions     No data to display

## 2022-10-01 NOTE — Anesthesia Postprocedure Evaluation (Signed)
Anesthesia Post Note  Patient: Lance Rollins  Procedure(s) Performed: ESOPHAGOGASTRODUODENOSCOPY (EGD) WITH PROPOFOL BIOPSY     Anesthesia Type: MAC Anesthetic complications: no   No notable events documented.  Last Vitals:  Vitals:   10/01/22 1111 10/01/22 1200  BP: 139/85 (!) 152/88  Pulse: 90 86  Resp: 17 15  Temp:  (!) 36.3 C  SpO2: 98% 95%    Last Pain:  Vitals:   10/01/22 1200  TempSrc: Oral  PainSc:                  Nolon Nations

## 2022-10-01 NOTE — Anesthesia Procedure Notes (Signed)
Procedure Name: MAC Date/Time: 10/01/2022 10:15 AM  Performed by: Lowella Dell, CRNAPre-anesthesia Checklist: Patient identified, Emergency Drugs available, Suction available, Patient being monitored and Timeout performed Patient Re-evaluated:Patient Re-evaluated prior to induction Oxygen Delivery Method: Nasal cannula Placement Confirmation: positive ETCO2 Dental Injury: Teeth and Oropharynx as per pre-operative assessment  Comments: Optiflow nasal canula utilized

## 2022-10-01 NOTE — Plan of Care (Signed)

## 2022-10-01 NOTE — Anesthesia Postprocedure Evaluation (Signed)
Anesthesia Post Note  Patient: Lance Rollins  Procedure(s) Performed: COLONOSCOPY WITH PROPOFOL     Patient location during evaluation: PACU Anesthesia Type: MAC Level of consciousness: awake and alert Pain management: pain level controlled Vital Signs Assessment: post-procedure vital signs reviewed and stable Respiratory status: spontaneous breathing, nonlabored ventilation, respiratory function stable and patient connected to nasal cannula oxygen Cardiovascular status: stable and blood pressure returned to baseline Postop Assessment: no apparent nausea or vomiting Anesthetic complications: no  No notable events documented.  Last Vitals:  Vitals:   10/01/22 1111 10/01/22 1200  BP: 139/85 (!) 152/88  Pulse: 90 86  Resp: 17 15  Temp:  (!) 36.3 C  SpO2: 98% 95%    Last Pain:  Vitals:   10/01/22 1200  TempSrc: Oral  PainSc:                  [name]

## 2022-10-01 NOTE — Interval H&P Note (Signed)
History and Physical Interval Note:  10/01/2022 9:43 AM  Lance Rollins  has presented today for surgery, with the diagnosis of Anemia not explained by EGD.  The various methods of treatment have been discussed with the patient and family. After consideration of risks, benefits and other options for treatment, the patient has consented to  Procedure(s): COLONOSCOPY WITH PROPOFOL (N/A) as a surgical intervention.  The patient's history has been reviewed, patient examined, no change in status, stable for surgery.  I have reviewed the patient's chart and labs.  Questions were answered to the patient's satisfaction.     [place]

## 2022-10-02 DIAGNOSIS — D72829 Elevated white blood cell count, unspecified: Secondary | ICD-10-CM | POA: Diagnosis present

## 2022-10-02 DIAGNOSIS — D649 Anemia, unspecified: Secondary | ICD-10-CM | POA: Diagnosis present

## 2022-10-02 DIAGNOSIS — K219 Gastro-esophageal reflux disease without esophagitis: Secondary | ICD-10-CM | POA: Diagnosis present

## 2022-10-02 DIAGNOSIS — K5730 Diverticulosis of large intestine without perforation or abscess without bleeding: Secondary | ICD-10-CM | POA: Diagnosis present

## 2022-10-02 DIAGNOSIS — M1611 Unilateral primary osteoarthritis, right hip: Secondary | ICD-10-CM | POA: Diagnosis present

## 2022-10-02 DIAGNOSIS — Z79899 Other long term (current) drug therapy: Secondary | ICD-10-CM | POA: Diagnosis not present

## 2022-10-02 DIAGNOSIS — M7061 Trochanteric bursitis, right hip: Secondary | ICD-10-CM | POA: Diagnosis present

## 2022-10-02 DIAGNOSIS — D509 Iron deficiency anemia, unspecified: Secondary | ICD-10-CM | POA: Diagnosis present

## 2022-10-02 DIAGNOSIS — R55 Syncope and collapse: Secondary | ICD-10-CM | POA: Diagnosis present

## 2022-10-02 DIAGNOSIS — E7800 Pure hypercholesterolemia, unspecified: Secondary | ICD-10-CM | POA: Diagnosis present

## 2022-10-02 DIAGNOSIS — E669 Obesity, unspecified: Secondary | ICD-10-CM | POA: Diagnosis present

## 2022-10-02 DIAGNOSIS — D75839 Thrombocytosis, unspecified: Secondary | ICD-10-CM | POA: Diagnosis present

## 2022-10-02 DIAGNOSIS — Z833 Family history of diabetes mellitus: Secondary | ICD-10-CM | POA: Diagnosis not present

## 2022-10-02 DIAGNOSIS — G894 Chronic pain syndrome: Secondary | ICD-10-CM | POA: Diagnosis present

## 2022-10-02 DIAGNOSIS — K2971 Gastritis, unspecified, with bleeding: Secondary | ICD-10-CM | POA: Diagnosis present

## 2022-10-02 DIAGNOSIS — Z9079 Acquired absence of other genital organ(s): Secondary | ICD-10-CM | POA: Diagnosis not present

## 2022-10-02 DIAGNOSIS — K922 Gastrointestinal hemorrhage, unspecified: Secondary | ICD-10-CM | POA: Diagnosis present

## 2022-10-02 DIAGNOSIS — Z791 Long term (current) use of non-steroidal anti-inflammatories (NSAID): Secondary | ICD-10-CM | POA: Diagnosis not present

## 2022-10-02 DIAGNOSIS — I10 Essential (primary) hypertension: Secondary | ICD-10-CM | POA: Diagnosis present

## 2022-10-02 DIAGNOSIS — Z803 Family history of malignant neoplasm of breast: Secondary | ICD-10-CM | POA: Diagnosis not present

## 2022-10-02 DIAGNOSIS — B9681 Helicobacter pylori [H. pylori] as the cause of diseases classified elsewhere: Secondary | ICD-10-CM | POA: Diagnosis present

## 2022-10-02 DIAGNOSIS — K648 Other hemorrhoids: Secondary | ICD-10-CM | POA: Diagnosis present

## 2022-10-02 DIAGNOSIS — M7062 Trochanteric bursitis, left hip: Secondary | ICD-10-CM | POA: Diagnosis present

## 2022-10-02 DIAGNOSIS — Z8711 Personal history of peptic ulcer disease: Secondary | ICD-10-CM | POA: Diagnosis not present

## 2022-10-02 DIAGNOSIS — Z96643 Presence of artificial hip joint, bilateral: Secondary | ICD-10-CM | POA: Diagnosis present

## 2022-10-02 DIAGNOSIS — Z8546 Personal history of malignant neoplasm of prostate: Secondary | ICD-10-CM | POA: Diagnosis not present

## 2022-10-02 LAB — CBC WITH DIFFERENTIAL/PLATELET
Abs Immature Granulocytes: 0.11 10*3/uL — ABNORMAL HIGH (ref 0.00–0.07)
Basophils Absolute: 0.1 10*3/uL (ref 0.0–0.1)
Basophils Relative: 0 %
Eosinophils Absolute: 0 10*3/uL (ref 0.0–0.5)
Eosinophils Relative: 0 %
HCT: 26.7 % — ABNORMAL LOW (ref 39.0–52.0)
Hemoglobin: 8.2 g/dL — ABNORMAL LOW (ref 13.0–17.0)
Immature Granulocytes: 1 %
Lymphocytes Relative: 5 %
Lymphs Abs: 1 10*3/uL (ref 0.7–4.0)
MCH: 21.3 pg — ABNORMAL LOW (ref 26.0–34.0)
MCHC: 30.7 g/dL (ref 30.0–36.0)
MCV: 69.4 fL — ABNORMAL LOW (ref 80.0–100.0)
Monocytes Absolute: 1.2 10*3/uL — ABNORMAL HIGH (ref 0.1–1.0)
Monocytes Relative: 6 %
Neutro Abs: 17.5 10*3/uL — ABNORMAL HIGH (ref 1.7–7.7)
Neutrophils Relative %: 88 %
Platelets: 516 10*3/uL — ABNORMAL HIGH (ref 150–400)
RBC: 3.85 MIL/uL — ABNORMAL LOW (ref 4.22–5.81)
RDW: 20 % — ABNORMAL HIGH (ref 11.5–15.5)
WBC: 19.9 10*3/uL — ABNORMAL HIGH (ref 4.0–10.5)
nRBC: 0 % (ref 0.0–0.2)

## 2022-10-02 LAB — BASIC METABOLIC PANEL
Anion gap: 9 (ref 5–15)
BUN: 8 mg/dL (ref 8–23)
CO2: 23 mmol/L (ref 22–32)
Calcium: 9 mg/dL (ref 8.9–10.3)
Chloride: 100 mmol/L (ref 98–111)
Creatinine, Ser: 1.13 mg/dL (ref 0.61–1.24)
GFR, Estimated: >60 mL/min (ref >60)
Glucose, Bld: 133 mg/dL — ABNORMAL HIGH (ref 70–99)
Potassium: 3.6 mmol/L (ref 3.5–5.1)
Sodium: 132 mmol/L — ABNORMAL LOW (ref 135–145)

## 2022-10-02 LAB — MAGNESIUM: Magnesium: 1.8 mg/dL (ref 1.7–2.4)

## 2022-10-02 LAB — SURGICAL PATHOLOGY

## 2022-10-02 MED ORDER — FERROUS SULFATE 325 (65 FE) MG PO TBEC: 325.0000 mg | DELAYED_RELEASE_TABLET | Freq: Every day | ORAL | 0 refills | Status: AC

## 2022-10-02 MED ORDER — NABUMETONE 750 MG PO TABS: 750.0000 mg | ORAL_TABLET | Freq: Every day | ORAL | Status: DC | PRN

## 2022-10-02 MED ORDER — PANTOPRAZOLE SODIUM 40 MG PO TBEC: 40.0000 mg | DELAYED_RELEASE_TABLET | Freq: Every day | ORAL | 0 refills | Status: DC

## 2022-10-02 NOTE — Discharge Summary (Addendum)
Physician Discharge Summary  Lance Rollins [id number] DOB: [birth year] DOA: 09/29/2022  PCP: Lawerance Cruel, MD  Admit date: 09/29/2022 Discharge date: 10/02/2022  Admitted From: Home Disposition: Home  Recommendations for Outpatient Follow-up:  Follow up with PCP in 1 week with repeat CBC/BMP Outpatient follow-up with GI Follow up in ED if symptoms worsen or new appear   Home Health: No Equipment/Devices: None  Discharge Condition: Stable CODE STATUS: Full Diet recommendation: Heart healthy  Brief/Interim Summary: 77 y.o. male with medical history significant of hypertension, dyslipidemia, obesity, chronic pain secondary to bilateral hip bursitis/trochanteric bursitis, history of prostatic adenocarcinoma status post radical prostatectomy in 2011 and a rectal bleeding in 2015 presented with syncopal episode along with intermittent black stools for 2 to 4 weeks.  On presentation, hemoglobin was 7.5 (baseline hemoglobin of 12.9 in 2021).  He was transfused 1 unit packed red cells and started on IV Protonix.  GI was consulted.  He underwent EGD and colonoscopy as well as capsule endoscopy.  Capsule endoscopy report is pending.  Hemoglobin has remained stable subsequently.  GI has cleared the patient for discharge.  Discharge home today.  Outpatient follow-up with GI.  Discharge Diagnoses:   Symptomatic iron deficiency anemia Possible occult GI bleeding -presented with syncopal episode along with intermittent black stools for 2 to 4 weeks.  On presentation, hemoglobin was 7.5 (baseline hemoglobin of 12.9 in 2021).  He was transfused 1 unit packed red cells and started on IV Protonix.   -GI following: Status post EGD on 09/30/2022 which showed erythematous mucosa in the antrum.  Status post colonoscopy on 10/01/2022 as well as capsule endoscopy. Capsule endoscopy report is pending.  Hemoglobin has remained stable subsequently.  GI has cleared the patient for discharge; GI will notify the  patient about capsule endoscopy report.  Discharge home today.  Outpatient follow-up with GI. -Hemoglobin 8.2 today as well.  Outpatient follow-up of CBC. -Given IV iron and will discharge on oral iron supplementation.   -Avoid NSAIDs  Syncope -Possibly from above.  Tolerated PT.  No further syncope since admission.   Thrombocytosis -Possibly reactive.   Hypertension -Monitor blood pressure.  Resume home regimen.  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   Obesity -Outpatient follow-up   History of prostate cancer status post radical prostatectomy in 2011 -Outpatient follow-up with urology  Leukocytosis -Possibly reactive.  Outpatient follow-up    Discharge Instructions  Discharge Instructions     Ambulatory referral to Gastroenterology   Complete by: As directed    Hospital followup   What is the reason for referral?: Other   Diet - low sodium heart healthy   Complete by: As directed    Increase activity slowly   Complete by: As directed       Allergies as of 10/02/2022   No Known Allergies      Medication List     STOP taking these medications    aspirin EC 325 MG tablet   diclofenac 75 MG EC tablet Commonly known as: VOLTAREN   nabumetone 750 MG tablet Commonly known as: RELAFEN       TAKE these medications    cyclobenzaprine 10 MG tablet Commonly known as: FLEXERIL Take 1 tablet (10 mg total) by mouth 3 (three) times daily as needed for muscle spasms (hip tightness).   ferrous sulfate 325 (65 FE) MG EC tablet Take 1 tablet (325 mg total) by mouth daily with breakfast.  irbesartan-hydrochlorothiazide 300-12.5 MG tablet Commonly known as: AVALIDE Take 1 tablet by mouth daily.   multivitamin with minerals Tabs tablet Take 1 tablet by mouth daily.   pantoprazole 40 MG tablet Commonly known as: Protonix Take 1 tablet (40 mg total) by mouth  daily.   simvastatin 40 MG tablet Commonly known as: ZOCOR Take 40 mg by mouth daily.            Follow-up Information     Lawerance Cruel, MD. Schedule an appointment as soon as possible for a visit in 1 week(s).   Specialty: Family Medicine Why: with cbc/bmp Contact information: [place] [address] [phone]                No Known Allergies  Consultations: GI   Procedures/Studies: DG Chest 2 View  Result Date: 09/29/2022 CLINICAL DATA:  Diaphoresis, syncope, left-sided chest discomfort EXAM: CHEST - 2 VIEW COMPARISON:  03/18/2007 FINDINGS: Frontal and lateral views of the chest demonstrate an unremarkable cardiac silhouette. No airspace disease, effusion, or pneumothorax. Focal eventration right hemidiaphragm. No acute bony abnormalities. IMPRESSION: 1. No acute intrathoracic process. Electronically Signed   By: Randa Ngo M.D.   On: 09/29/2022 20:32    EGD/colonoscopy/capsule endoscopy  Subjective: Patient seen and examined at bedside.  Feels okay to go home.  No fever, vomiting, shortness of breath reported.  Discharge Exam: Vitals:   10/02/22 1000 10/02/22 1054  BP:  120/76  Pulse:  90  Resp: (!) 22 20  Temp:  97.6 F (36.4 C)  SpO2:  99%    General: Pt is alert, awake, not in acute distress.  On room air. Cardiovascular: rate controlled, S1/S2 + Respiratory: bilateral decreased breath sounds at bases Abdominal: Soft, obese, NT, ND, bowel sounds + Extremities: Trace lower extremity edema; no cyanosis    The results of significant diagnostics from this hospitalization (including imaging, microbiology, ancillary and laboratory) are listed below for reference.     Microbiology: No results found for this or any previous visit (from the past 240 hour(s)).   Labs: BNP (last 3 results) No results for input(s): "BNP" in the last 8760 hours. Basic Metabolic Panel: Recent Labs  Lab 09/29/22 1949 10/01/22 0024  10/02/22 0020  NA 136 135 132*  K 3.9 3.7 3.6  CL 101 104 100  CO2 24 21* 23  GLUCOSE 100* 92 133*  BUN '18 11 8  '$ CREATININE 1.16 0.96 1.13  CALCIUM 9.0 8.7* 9.0  MG  --  1.9 1.8   Liver Function Tests: Recent Labs  Lab 09/29/22 1949  AST 22  ALT 21  ALKPHOS 60  BILITOT 0.3  PROT 7.4  ALBUMIN 3.4*   No results for input(s): "LIPASE", "AMYLASE" in the last 168 hours. No results for input(s): "AMMONIA" in the last 168 hours. CBC: Recent Labs  Lab 09/29/22 1949 09/30/22 0912 10/01/22 0024 10/02/22 0020  WBC 7.4 8.8 9.9 19.9*  NEUTROABS 3.7 5.3  --  17.5*  HGB 7.5* 8.9* 8.2* 8.2*  HCT 26.1* 29.2* 27.5* 26.7*  MCV 71.3* 71.7* 71.1* 69.4*  PLT 593* 509* 497* 516*   Cardiac Enzymes: No results for input(s): "CKTOTAL", "CKMB", "CKMBINDEX", "TROPONINI" in the last 168 hours. BNP: Invalid input(s): "POCBNP" CBG: Recent Labs  Lab 09/29/22 2004  GLUCAP 102*   D-Dimer No results for input(s): "DDIMER" in the last 72 hours. Hgb A1c No results for input(s): "HGBA1C" in the last 72 hours. Lipid Profile No results for input(s): "CHOL", "HDL", "  [place]", "TRIG", "CHOLHDL", "LDLDIRECT" in the last 72 hours. Thyroid function studies No results for input(s): "TSH", "T4TOTAL", "T3FREE", "THYROIDAB" in the last 72 hours.  Invalid input(s): "FREET3" Anemia work up Recent Labs    09/30/22 0230  VITAMINB12 635  FOLATE 25.6  FERRITIN 7*  TIBC 511*  IRON 20*  RETICCTPCT 1.0   Urinalysis    Component Value Date/Time   COLORURINE YELLOW 11/08/2013 0946   APPEARANCEUR CLEAR 11/08/2013 0946   LABSPEC 1.030 11/08/2013 0946   PHURINE 5.5 11/08/2013 0946   GLUCOSEU NEGATIVE 11/08/2013 0946   HGBUR NEGATIVE 11/08/2013 0946   BILIRUBINUR NEGATIVE 11/08/2013 0946   KETONESUR NEGATIVE 11/08/2013 0946   PROTEINUR NEGATIVE 11/08/2013 0946   UROBILINOGEN 0.2 11/08/2013 0946   NITRITE NEGATIVE 11/08/2013 0946   LEUKOCYTESUR NEGATIVE 11/08/2013 0946   Sepsis Labs Recent Labs   Lab 09/29/22 1949 09/30/22 0912 10/01/22 0024 10/02/22 0020  WBC 7.4 8.8 9.9 19.9*   Microbiology No results found for this or any previous visit (from the past 240 hour(s)).   Time coordinating discharge: 35 minutes  SIGNED:   Aline August, MD  [facility] 10/02/2022, 11:59 AM

## 2022-10-02 NOTE — Evaluation (Signed)
Physical Therapy Evaluation/ Discharge Patient Details Name: Lance Rollins MRN: [id number] DOB: 04-16-1946 Today's Date: 10/02/2022  History of Present Illness  77 yo male admitted 2/20 with syncope while visiting daughter in the hospital with anemia. 2/21 EGD, 2/22 colonoscopy. PMHx: HLD, HTN, GIB, prostate CA, chronic pain, Rt THA  Clinical Impression  PT very pleasant and reports no other falls or syncopal events PTA other than one leading to admission. Pt is active, goes to the gym and lives with wife. Pt able to walk long hall distance and complete stairs without assist. No further acute therapy needs with pt encouraged to continue mobility acutely.        Recommendations for follow up therapy are one component of a multi-disciplinary discharge planning process, led by the attending physician.  Recommendations may be updated based on patient status, additional functional criteria and insurance authorization.  Follow Up Recommendations No PT follow up      Assistance Recommended at Discharge None  Patient can return home with the following       Equipment Recommendations None recommended by PT  Recommendations for Other Services       Functional Status Assessment Patient has not had a recent decline in their functional status     Precautions / Restrictions Precautions Precautions: None      Mobility  Bed Mobility Overal bed mobility: Independent                  Transfers Overall transfer level: Independent                      Ambulation/Gait Ambulation/Gait assistance: Independent Gait Distance (Feet): 1000 Feet Assistive device: None Gait Pattern/deviations: WFL(Within Functional Limits)   Gait velocity interpretation: >4.37 ft/sec, indicative of normal walking speed      Stairs Stairs: Yes Stairs assistance: Modified independent (Device/Increase time) Stair Management: One rail Right, Alternating pattern, Forwards Number of Stairs: 3     Wheelchair Mobility    Modified Rankin (Stroke Patients Only)       Balance Overall balance assessment: No apparent balance deficits (not formally assessed)                                           Pertinent Vitals/Pain Pain Assessment Pain Assessment: No/denies pain    Home Living Family/patient expects to be discharged to:: Private residence Living Arrangements: Spouse/significant other Available Help at Discharge: Family;Available 24 hours/day Type of Home: House Home Access: Stairs to enter   [employer] of Steps: 3 Alternate Level Stairs-Number of Steps: 7 Home Layout: Multi-level Home Equipment: Cane - single point Additional Comments: pt goes to the gym 7 days a week    Prior Function Prior Level of Function : Independent/Modified Independent;Driving                     Hand Dominance        Extremity/Trunk Assessment   Upper Extremity Assessment Upper Extremity Assessment: Overall WFL for tasks assessed    Lower Extremity Assessment Lower Extremity Assessment: Overall WFL for tasks assessed    Cervical / Trunk Assessment Cervical / Trunk Assessment: Normal  Communication   Communication: No difficulties  Cognition Arousal/Alertness: Awake/alert Behavior During Therapy: WFL for tasks assessed/performed Overall Cognitive Status: Within Functional Limits for tasks assessed  General Comments      Exercises     Assessment/Plan    PT Assessment Patient does not need any further PT services  PT Problem List         PT Treatment Interventions      PT Goals (Current goals can be found in the Care Plan section)  Acute Rehab PT Goals PT Goal Formulation: All assessment and education complete, DC therapy    Frequency       Co-evaluation               AM-PAC PT "6 Clicks" Mobility  Outcome Measure Help needed turning from your back to  your side while in a flat bed without using bedrails?: None Help needed moving from lying on your back to sitting on the side of a flat bed without using bedrails?: None Help needed moving to and from a bed to a chair (including a wheelchair)?: None Help needed standing up from a chair using your arms (e.g., wheelchair or bedside chair)?: None Help needed to walk in hospital room?: None Help needed climbing 3-5 steps with a railing? : None 6 Click Score: 24    End of Session   Activity Tolerance: Patient tolerated treatment well Patient left: in chair;with call bell/phone within reach Nurse Communication: Mobility status PT Visit Diagnosis: Other abnormalities of gait and mobility (R26.89)    Time: [id number] PT Time Calculation (min) (ACUTE ONLY): 15 min   Charges:   PT Evaluation $PT Eval Low Complexity: 1 Low          Priyansh Pry P, PT Acute Rehabilitation Services Office: [phone]   Lamarr Lulas 10/02/2022, 9:59 AM

## 2022-10-02 NOTE — TOC Transition Note (Signed)
Transition of Care [facility]) - CM/SW Discharge Note   Patient Details  Name: Lance Rollins MRN: [id number] Date of Birth: 28-Nov-1945  Transition of [facility] CM/SW Contact:  Zenon Mayo, RN Phone Number: 10/02/2022, 1:18 PM   Clinical Narrative:    Patient is for dc today, he states his wife will transport him home. He is indep, has no needs.         Patient Goals and CMS Choice      Discharge Placement                         Discharge Plan and Services Additional resources added to the After Visit Summary for                                       Social Determinants of Health (SDOH) Interventions SDOH Screenings   Food Insecurity: No Food Insecurity (09/30/2022)  Housing: Low Risk  (09/30/2022)  Transportation Needs: No Transportation Needs (09/30/2022)  Utilities: Not At Risk (09/30/2022)  Depression (PHQ2-9): Low Risk  (09/21/2022)  Tobacco Use: Low Risk  (10/01/2022)     Readmission Risk Interventions     No data to display

## 2022-10-04 ENCOUNTER — Encounter (HOSPITAL_COMMUNITY): Payer: Self-pay | Admitting: Gastroenterology

## 2022-10-05 ENCOUNTER — Telehealth: Payer: Self-pay | Admitting: Gastroenterology

## 2022-10-05 ENCOUNTER — Other Ambulatory Visit: Payer: Self-pay

## 2022-10-05 DIAGNOSIS — A048 Other specified bacterial intestinal infections: Secondary | ICD-10-CM

## 2022-10-05 MED ORDER — BISMUTH SUBSALICYLATE 262 MG PO TABS: 2.0000 | ORAL_TABLET | Freq: Four times a day (QID) | ORAL | 0 refills | Status: AC

## 2022-10-05 MED ORDER — METRONIDAZOLE 250 MG PO TABS: 500.0000 mg | ORAL_TABLET | Freq: Four times a day (QID) | ORAL | 0 refills | Status: AC

## 2022-10-05 MED ORDER — TETRACYCLINE HCL 500 MG PO CAPS: 500.0000 mg | ORAL_CAPSULE | Freq: Four times a day (QID) | ORAL | 0 refills | Status: AC

## 2022-10-05 NOTE — Telephone Encounter (Signed)
Inbound call from pt wife , Lance Rollins stated that you called and left voice mail for her husband to return your call .Please advise

## 2022-10-05 NOTE — Telephone Encounter (Signed)
See results note. 

## 2022-10-08 DIAGNOSIS — R55 Syncope and collapse: Secondary | ICD-10-CM | POA: Diagnosis not present

## 2022-10-08 DIAGNOSIS — Z09 Encounter for follow-up examination after completed treatment for conditions other than malignant neoplasm: Secondary | ICD-10-CM | POA: Diagnosis not present

## 2022-10-08 DIAGNOSIS — K922 Gastrointestinal hemorrhage, unspecified: Secondary | ICD-10-CM | POA: Diagnosis not present

## 2022-10-08 DIAGNOSIS — E611 Iron deficiency: Secondary | ICD-10-CM | POA: Diagnosis not present

## 2022-10-08 DIAGNOSIS — R899 Unspecified abnormal finding in specimens from other organs, systems and tissues: Secondary | ICD-10-CM | POA: Diagnosis not present

## 2022-10-23 ENCOUNTER — Encounter: Payer: Self-pay | Admitting: Gastroenterology

## 2022-11-25 ENCOUNTER — Encounter: Payer: Self-pay | Admitting: Physician Assistant

## 2022-11-25 ENCOUNTER — Other Ambulatory Visit (INDEPENDENT_AMBULATORY_CARE_PROVIDER_SITE_OTHER): Payer: Medicare Other

## 2022-11-25 ENCOUNTER — Ambulatory Visit: Payer: Medicare Other | Admitting: Physician Assistant

## 2022-11-25 VITALS — BP 112/68 | HR 88 | Ht 67.0 in | Wt 223.0 lb

## 2022-11-25 DIAGNOSIS — D509 Iron deficiency anemia, unspecified: Secondary | ICD-10-CM

## 2022-11-25 DIAGNOSIS — K2970 Gastritis, unspecified, without bleeding: Secondary | ICD-10-CM

## 2022-11-25 DIAGNOSIS — B9681 Helicobacter pylori [H. pylori] as the cause of diseases classified elsewhere: Secondary | ICD-10-CM | POA: Diagnosis not present

## 2022-11-25 LAB — CBC WITH DIFFERENTIAL/PLATELET
Basophils Absolute: 0 10*3/uL (ref 0.0–0.1)
Basophils Relative: 0.6 % (ref 0.0–3.0)
Eosinophils Absolute: 0.3 10*3/uL (ref 0.0–0.7)
Eosinophils Relative: 4.4 % (ref 0.0–5.0)
HCT: 34.7 % — ABNORMAL LOW (ref 39.0–52.0)
Hemoglobin: 11.2 g/dL — ABNORMAL LOW (ref 13.0–17.0)
Lymphocytes Relative: 30.6 % (ref 12.0–46.0)
Lymphs Abs: 2.2 10*3/uL (ref 0.7–4.0)
MCHC: 32.2 g/dL (ref 30.0–36.0)
MCV: 76.3 fl — ABNORMAL LOW (ref 78.0–100.0)
Monocytes Absolute: 0.7 10*3/uL (ref 0.1–1.0)
Monocytes Relative: 10 % (ref 3.0–12.0)
Neutro Abs: 3.9 10*3/uL (ref 1.4–7.7)
Neutrophils Relative %: 54.4 % (ref 43.0–77.0)
Platelets: 389 10*3/uL (ref 150.0–400.0)
RBC: 4.55 Mil/uL (ref 4.22–5.81)
RDW: 30.2 % — ABNORMAL HIGH (ref 11.5–15.5)
WBC: 7.2 10*3/uL (ref 4.0–10.5)

## 2022-11-25 LAB — IBC + FERRITIN
Ferritin: 62.3 ng/mL (ref 22.0–322.0)
Iron: 61 ug/dL (ref 42–165)
Saturation Ratios: 16.6 % — ABNORMAL LOW (ref 20.0–50.0)
TIBC: 368.2 ug/dL (ref 250.0–450.0)
Transferrin: 263 mg/dL (ref 212.0–360.0)

## 2022-11-25 NOTE — Progress Notes (Signed)
Reviewed and agree with management plans. ? ?Vi Biddinger L. Kelise Kuch, MD, MPH  ?

## 2022-11-25 NOTE — Progress Notes (Signed)
Chief Complaint: Follow-up anemia  HPI:    Lance Rollins is a 77 year old African-American male with a past medical history as listed below including hypertension, obesity and chronic pain secondary to bilateral hip bursitis/trochanteric bursitis and history of prostatic adenocarcinoma status post radical prostatectomy in 2011, signed to Dr. Orvan Falconer at recent hospitalization, who presents to clinic today for follow-up of his anemia.      09/30/2022 patient consulted by our service in the hospital for symptomatic anemia.  At that time hemoglobin down to 7.5 (from baseline of 12.9 in 2021), had some palpitations/indigestion and episode of syncope.  Had a recent colonoscopy in 2021 and recommendations for EGD were made.     09/30/2022 EGD with erythematous mucosa in the antrum and otherwise normal.  Patient reduced to Pantoprazole 40 mg daily for 8 weeks.  Recommended a colonoscopy.  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  Patient was started on quadruple therapy.  Repeat H. pylori stool testing recommended 2 weeks after treatment.    10/01/2022 colonoscopy with diverticulosis and otherwise normal.  A capsule endoscopy was placed.    10/01/22 capsule endoscopy with gastric erosions noted likely from gastric biopsy site and otherwise negative.  Patient was continued on oral iron therapy and told to repeat iron studies in 3 to 4 months.    10/02/2022 CBC with a hemoglobin of 8.2.  MCV low at 69.4.    Today, patient presents to clinic and tells me he finished all the medication for the H. pylori about a month ago.  He is not taking Pantoprazole since.  Tells me he never took the iron supplement that is listed on his med list.  He tells me he feels very well, no further episodes of syncope or indigestion.  He wants to know how his iron levels get checked.    Denies fever, chills, weight loss, nausea or vomiting.   Past Medical History:  Diagnosis Date   Arthritis    Bleeding per rectum  08/2013   "black and bright red" (08/14/2013)   Hypercholesteremia    Hypertension    Obesity    Prostate cancer 2011   Unilateral primary osteoarthritis, right hip 04/23/2017    Past Surgical History:  Procedure Laterality Date   BIOPSY  09/30/2022   Procedure: BIOPSY;  Surgeon: Tressia Danas, MD;  Location: [facility] ENDOSCOPY;  Service: Gastroenterology;;   COLONOSCOPY WITH PROPOFOL N/A 10/01/2022   Procedure: COLONOSCOPY WITH PROPOFOL;  Surgeon: Tressia Danas, MD;  Location: [facility] ENDOSCOPY;  Service: Gastroenterology;  Laterality: N/A;   ESOPHAGOGASTRODUODENOSCOPY N/A 08/15/2013   Procedure: ESOPHAGOGASTRODUODENOSCOPY (EGD);  Surgeon: Rachael Fee, MD;  Location: [facility] ENDOSCOPY;  Service: Endoscopy;  Laterality: N/A;   ESOPHAGOGASTRODUODENOSCOPY (EGD) WITH PROPOFOL N/A 09/30/2022   Procedure: ESOPHAGOGASTRODUODENOSCOPY (EGD) WITH PROPOFOL;  Surgeon: Tressia Danas, MD;  Location: [facility] ENDOSCOPY;  Service: Gastroenterology;  Laterality: N/A;   GIVENS CAPSULE STUDY  10/01/2022   Procedure: GIVENS CAPSULE STUDY;  Surgeon: Tressia Danas, MD;  Location: [facility] ENDOSCOPY;  Service: Gastroenterology;;   INGUINAL HERNIA REPAIR Bilateral ~ 2004   ROBOT ASSISTED LAPAROSCOPIC RADICAL PROSTATECTOMY  08/2009   Hattie Perch 08/14/2009 (08/14/2013)   TOTAL HIP ARTHROPLASTY Left 11/14/2013   Procedure: LEFT TOTAL HIP ARTHROPLASTY ANTERIOR APPROACH;  Surgeon: Kathryne Hitch, MD;  Location: [facility] OR;  Service: Orthopedics;  Laterality: Left;   TOTAL HIP ARTHROPLASTY Right 04/23/2017   Procedure: RIGHT TOTAL HIP ARTHROPLASTY ANTERIOR APPROACH;  Surgeon: Kathryne Hitch, MD;  Location: [facility] ORS;  Service: Orthopedics;  Laterality: Right;    Current Outpatient Medications  Medication Sig Dispense Refill   cyclobenzaprine (FLEXERIL) 10 MG tablet Take 1 tablet (10 mg total) by mouth 3 (three) times daily as needed for muscle spasms (hip tightness). 60 tablet 11   irbesartan-hydrochlorothiazide (AVALIDE) 300-12.5  MG tablet Take 1 tablet by mouth daily.     Multiple Vitamin (MULTIVITAMIN WITH MINERALS) TABS tablet Take 1 tablet by mouth daily.     simvastatin (ZOCOR) 40 MG tablet Take 40 mg by mouth daily.     ferrous sulfate 325 (65 FE) MG EC tablet Take 1 tablet (325 mg total) by mouth daily with breakfast. 30 tablet 0   pantoprazole (PROTONIX) 40 MG tablet Take 1 tablet (40 mg total) by mouth daily. 30 tablet 0   No current facility-administered medications for this visit.    Allergies as of 11/25/2022   (No Known Allergies)    Family History  Problem Relation Age of Onset   Diabetes Sister    Diabetes Mother    Breast cancer Mother    Colon cancer Neg Hx    Stomach cancer Neg Hx    Esophageal cancer Neg Hx    Pancreatic cancer Neg Hx     Social History   Socioeconomic History   Marital status: Married    Spouse name: Not on file   Number of children: 5   Years of education: Not on file   Highest education level: Not on file  Occupational History   Occupation: retired  Tobacco Use   Smoking status: Never   Smokeless tobacco: Never  Vaping Use   Vaping Use: Never used  Substance and Sexual Activity   Alcohol use: Yes    Comment: 08/14/2013 "might have a drink q other holiday"   Drug use: No   Sexual activity: Not Currently  Other Topics Concern   Not on file  Social History Narrative   Not on file   Social Determinants of Health   Financial Resource Strain: Not on file  Food Insecurity: No Food Insecurity (09/30/2022)   Hunger Vital Sign    Worried About Running Out of Food in the Last Year: Never true    Ran Out of Food in the Last Year: Never true  Transportation Needs: No Transportation Needs (09/30/2022)   PRAPARE - Administrator, Civil Service (Medical): No    Lack of Transportation (Non-Medical): No  Physical Activity: Not on file  Stress: Not on file  Social Connections: Not on file  Intimate Partner Violence: Not At Risk (09/30/2022)    Humiliation, Afraid, Rape, and Kick questionnaire    Fear of Current or Ex-Partner: No    Emotionally Abused: No    Physically Abused: No    Sexually Abused: No    Review of Systems:    Constitutional: No weight loss, fever or chills Cardiovascular: No chest pain Respiratory: No SOB Gastrointestinal: See HPI and otherwise negative   Physical Exam:  Vital signs: BP 112/68   Pulse 88   Ht  (1.702 m)   Wt 223 lb (101.2 kg)   SpO2 98%   BMI 34.93 kg/m    Constitutional:   Pleasant overweight AA male appears to be in NAD, Well developed, Well nourished, alert and cooperative Respiratory: Respirations even and unlabored. Lungs clear to auscultation bilaterally.   No wheezes, crackles, or rhonchi.  Cardiovascular: Normal S1, S2. No MRG. Regular rate and rhythm. No peripheral edema, cyanosis or pallor.  Gastrointestinal:  Soft, nondistended, nontender. No rebound or guarding. Normal bowel sounds. No appreciable masses or hepatomegaly. Rectal:  Not performed.  Psychiatric: Oriented to person, place and time. Demonstrates good judgement and reason without abnormal affect or behaviors.  RELEVANT LABS AND IMAGING: CBC    Component Value Date/Time   WBC 19.9 (H) 10/02/2022 0020   RBC 3.85 (L) 10/02/2022 0020   HGB 8.2 (L) 10/02/2022 0020   HCT 26.7 (L) 10/02/2022 0020   PLT 516 (H) 10/02/2022 0020   MCV 69.4 (L) 10/02/2022 0020   MCH 21.3 (L) 10/02/2022 0020   MCHC 30.7 10/02/2022 0020   RDW 20.0 (H) 10/02/2022 0020   LYMPHSABS 1.0 10/02/2022 0020   MONOABS 1.2 (H) 10/02/2022 0020   EOSABS 0.0 10/02/2022 0020   BASOSABS 0.1 10/02/2022 0020    CMP     Component Value Date/Time   NA 132 (L) 10/02/2022 0020   K 3.6 10/02/2022 0020   CL 100 10/02/2022 0020   CO2 23 10/02/2022 0020   GLUCOSE 133 (H) 10/02/2022 0020   BUN 8 10/02/2022 0020   CREATININE 1.13 10/02/2022 0020   CALCIUM 9.0 10/02/2022 0020   PROT 7.4 09/29/2022 1949   ALBUMIN 3.4 (L) 09/29/2022 1949   AST  22 09/29/2022 1949   ALT 21 09/29/2022 1949   ALKPHOS 60 09/29/2022 1949   BILITOT 0.3 09/29/2022 1949   GFRNONAA >60 10/02/2022 0020   GFRAA >60 12/08/2019 2206    Assessment: 1.  IDA: Recent hospitalization after syncope with a hemoglobin drop to 7 range, EGD, colonoscopy and pill capsule endoscopy that hospitalization, biopsies from EGD with H. pylori gastritis which was likely contributing, no recheck of hemoglobin since, patient feeling well 2.  H. pylori gastritis: Found at time of EGD in the hospital, likely the cause of above  Plan: 1.  Will send patient down to the lab today to get repeat CBC and iron studies including ferritin.  An H. pylori fecal antigen is already ordered.  He has been off of his PPI for 4 weeks now.  I explained this testing to him. 2.  Pending above will consider starting the patient back on his iron supplement.  He will likely still need Pantoprazole 40 mg at least once daily for the next month given gastritis seen at time of exam.  We will wait to start this until results from above. 3.  Patient to follow in clinic per recommendations after testing.  Hyacinth Meeker, PA-C [facility] 11/25/2022, 10:56 AM  Cc: Daisy Floro, MD

## 2022-11-25 NOTE — Patient Instructions (Signed)
Your provider has requested that you go to the basement level for lab work before leaving today. Press "B" on the elevator. The lab is located at the first door on the left as you exit the elevator.   Due to recent changes in healthcare laws, you may see the results of your imaging and laboratory studies on MyChart before your provider has had a chance to review them.  We understand that in some cases there may be results that are confusing or concerning to you. Not all laboratory results come back in the same time frame and the provider may be waiting for multiple results in order to interpret others.  Please give [place] 48 hours in order for your provider to thoroughly review all the results before contacting the office for clarification of your results.    _______________________________________________________  If your blood pressure at your visit was 140/90 or greater, please contact your primary care physician to follow up on this.  _______________________________________________________  If you are age 42 or older, your body mass index should be between 23-30. Your Body mass index is 34.93 kg/m. If this is out of the aforementioned range listed, please consider follow up with your Primary Care Provider.  If you are age 65 or younger, your body mass index should be between 19-25. Your Body mass index is 34.93 kg/m. If this is out of the aformentioned range listed, please consider follow up with your Primary Care Provider.   ________________________________________________________  The [facility] providers would like to encourage you to use [facility] to communicate with providers for non-urgent requests or questions.  Due to long hold times on the telephone, sending your provider a message by [facility] may be a faster and more efficient way to get a response.  Please allow 48 business hours for a response.  Please remember that this is for non-urgent requests.   _______________________________________________________   I appreciate the  opportunity to care for you  Thank You   Jacelyn Grip

## 2022-11-26 ENCOUNTER — Other Ambulatory Visit: Payer: Medicare Other

## 2022-11-26 ENCOUNTER — Other Ambulatory Visit: Payer: Self-pay

## 2022-11-26 DIAGNOSIS — B9681 Helicobacter pylori [H. pylori] as the cause of diseases classified elsewhere: Secondary | ICD-10-CM

## 2022-11-26 DIAGNOSIS — D509 Iron deficiency anemia, unspecified: Secondary | ICD-10-CM | POA: Diagnosis not present

## 2022-11-26 DIAGNOSIS — K2970 Gastritis, unspecified, without bleeding: Secondary | ICD-10-CM | POA: Diagnosis not present

## 2022-11-26 MED ORDER — PANTOPRAZOLE SODIUM 40 MG PO TBEC: 40.0000 mg | DELAYED_RELEASE_TABLET | Freq: Every day | ORAL | 0 refills | Status: DC

## 2022-11-26 NOTE — Addendum Note (Signed)
Addended by: Missy Sabins on: 11/26/2022 10:27 AM   Modules accepted: Orders

## 2022-11-27 LAB — HELICOBACTER PYLORI  SPECIAL ANTIGEN
MICRO NUMBER:: 14843355
SPECIMEN QUALITY: ADEQUATE

## 2022-11-30 ENCOUNTER — Telehealth: Payer: Self-pay | Admitting: Physician Assistant

## 2022-11-30 NOTE — Telephone Encounter (Signed)
See results note dated 4/22

## 2022-11-30 NOTE — Telephone Encounter (Signed)
Patients wife is returning your call for results.

## 2022-12-17 DIAGNOSIS — I10 Essential (primary) hypertension: Secondary | ICD-10-CM | POA: Diagnosis not present

## 2022-12-17 DIAGNOSIS — E611 Iron deficiency: Secondary | ICD-10-CM | POA: Diagnosis not present

## 2022-12-17 DIAGNOSIS — E7800 Pure hypercholesterolemia, unspecified: Secondary | ICD-10-CM | POA: Diagnosis not present

## 2022-12-17 DIAGNOSIS — D649 Anemia, unspecified: Secondary | ICD-10-CM | POA: Diagnosis not present

## 2022-12-24 DIAGNOSIS — Z8719 Personal history of other diseases of the digestive system: Secondary | ICD-10-CM | POA: Diagnosis not present

## 2022-12-24 DIAGNOSIS — Z9181 History of falling: Secondary | ICD-10-CM | POA: Diagnosis not present

## 2022-12-24 DIAGNOSIS — E7800 Pure hypercholesterolemia, unspecified: Secondary | ICD-10-CM | POA: Diagnosis not present

## 2022-12-24 DIAGNOSIS — Z0000 Encounter for general adult medical examination without abnormal findings: Secondary | ICD-10-CM | POA: Diagnosis not present

## 2022-12-24 DIAGNOSIS — I10 Essential (primary) hypertension: Secondary | ICD-10-CM | POA: Diagnosis not present

## 2023-02-25 ENCOUNTER — Telehealth: Payer: Self-pay

## 2023-02-25 NOTE — Telephone Encounter (Signed)
Lm on home vm for patient to return call.  

## 2023-02-25 NOTE — Telephone Encounter (Signed)
-----   Message from Nurse [place] P sent at 11/26/2022 12:44 PM EDT ----- Regarding: 16-month labs CBC and IBC + Ferritin - orders are in epic

## 2023-02-25 NOTE — Telephone Encounter (Signed)
Patient and his wife returned call. Front office staff informed patient of lab reminder.

## 2023-03-01 ENCOUNTER — Other Ambulatory Visit: Payer: Self-pay

## 2023-03-01 ENCOUNTER — Other Ambulatory Visit (INDEPENDENT_AMBULATORY_CARE_PROVIDER_SITE_OTHER): Payer: Medicare Other

## 2023-03-01 DIAGNOSIS — D509 Iron deficiency anemia, unspecified: Secondary | ICD-10-CM

## 2023-03-01 DIAGNOSIS — B9681 Helicobacter pylori [H. pylori] as the cause of diseases classified elsewhere: Secondary | ICD-10-CM | POA: Diagnosis not present

## 2023-03-01 DIAGNOSIS — K2970 Gastritis, unspecified, without bleeding: Secondary | ICD-10-CM

## 2023-03-01 LAB — CBC WITH DIFFERENTIAL/PLATELET
Basophils Absolute: 0 10*3/uL (ref 0.0–0.1)
Basophils Relative: 0.6 % (ref 0.0–3.0)
Eosinophils Absolute: 0.2 10*3/uL (ref 0.0–0.7)
Eosinophils Relative: 2.4 % (ref 0.0–5.0)
HCT: 41.1 % (ref 39.0–52.0)
Hemoglobin: 13.1 g/dL (ref 13.0–17.0)
Lymphocytes Relative: 18.4 % (ref 12.0–46.0)
Lymphs Abs: 1.6 10*3/uL (ref 0.7–4.0)
MCHC: 31.7 g/dL (ref 30.0–36.0)
MCV: 83.8 fl (ref 78.0–100.0)
Monocytes Absolute: 0.7 10*3/uL (ref 0.1–1.0)
Monocytes Relative: 8.2 % (ref 3.0–12.0)
Neutro Abs: 6 10*3/uL (ref 1.4–7.7)
Neutrophils Relative %: 70.4 % (ref 43.0–77.0)
Platelets: 348 10*3/uL (ref 150.0–400.0)
RBC: 4.9 Mil/uL (ref 4.22–5.81)
RDW: 15.6 % — ABNORMAL HIGH (ref 11.5–15.5)
WBC: 8.5 10*3/uL (ref 4.0–10.5)

## 2023-03-01 LAB — IBC + FERRITIN
Ferritin: 27.1 ng/mL (ref 22.0–322.0)
Iron: 53 ug/dL (ref 42–165)
Saturation Ratios: 12.2 % — ABNORMAL LOW (ref 20.0–50.0)
TIBC: 434 ug/dL (ref 250.0–450.0)
Transferrin: 310 mg/dL (ref 212.0–360.0)

## 2023-03-05 ENCOUNTER — Other Ambulatory Visit: Payer: Self-pay | Admitting: Physician Assistant

## 2023-03-05 DIAGNOSIS — D649 Anemia, unspecified: Secondary | ICD-10-CM

## 2023-03-05 NOTE — Progress Notes (Unsigned)
[facility] [phone]   [phone]  CONSULT NOTE  REFERRING PHYSICIAN: Hyacinth Meeker   REASON FOR CONSULTATION:  Anemia   HPI Lance Rollins is a 77 y.o. male with a medical history significant for gastritis, arthritis, hip replacement, hypertension, obesity, history of prostatic adenocarcinoma status post radical prostatectomy in 2011, and chronic pain syndrome was referred to the clinic for iron deficiency anemia.  Patient follows with gastroenterologist, Dr. Orvan Falconer.   In summary, the patient was hospitalized on 09/30/2022 for symptomatic anemia.  His hemoglobin was down to 7 point time at that time (from baseline 12.9 in 2021) that showed erythematous mucosa.  He was put on Protonix for 8 weeks.  Colonoscopy was recommended.  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  He was started on quadruple therapy.  He had a colonoscopy on 10/01/2022 with diverticulosis.  He had a capsule endoscopy showing gastric erosions from gastric biopsy and otherwise negative.  He was placed on iron therapy; ever, the patient never took the iron supplement.  He was seen for follow-up visit by gastroenterology on 11/25/2022 and requested to have his iron rechecked.  Hemoglobin had improved to 11.2.  His iron studies showed iron of 61, slightly low saturation at 16.6, normal ferritin at 62.3, normal TIBC at 368.2.  Commended continuing his iron supplement and rechecking in 3 months.  He had repeat blood work performed on 03/01/2023.  His hemoglobin was normal at 13.1.  His iron was on the low end of normal at 53.  His saturation was slightly low at 12.2 and his ferritin was slightly low at 27.1.  The patient has been taking his iron supplement every other day.  Because his iron was slightly lower despite taking oral iron supplement he was referred to hematology.  The oldest labs available to me are from 2008. He had some mild anemia in January 2011. I do  not have records from 2011-2015, but he had anemia in 2015 as well. The lowest Hbg I see at that time is 7.9. his anemia improved after that until his hospitalization in February 2024 (as stated above, where he presented with new onset anemia).   Overall regarding symptoms, fatigue, palpations, LH, and DOE?   The patient denies any visible bleeding. He denies any epistaxis, gingival bleeding, hemoptysis, hematemesis, or hematuria.  Denies ibuprofen use.  Blood thinner? Ice chips? Bariatric surgery. F?C?NS?WT.    HPI  Past Medical History:  Diagnosis Date   Arthritis    Bleeding per rectum 08/2013   "black and bright red" (08/14/2013)   Hypercholesteremia    Hypertension    Obesity    Prostate cancer (HCC) 2011   Unilateral primary osteoarthritis, right hip 04/23/2017    Past Surgical History:  Procedure Laterality Date   BIOPSY  09/30/2022   Procedure: BIOPSY;  Surgeon: Tressia Danas, MD;  Location: [facility] ENDOSCOPY;  Service: Gastroenterology;;   COLONOSCOPY WITH PROPOFOL N/A 10/01/2022   Procedure: COLONOSCOPY WITH PROPOFOL;  Surgeon: Tressia Danas, MD;  Location: [facility] ENDOSCOPY;  Service: Gastroenterology;  Laterality: N/A;   ESOPHAGOGASTRODUODENOSCOPY N/A 08/15/2013   Procedure: ESOPHAGOGASTRODUODENOSCOPY (EGD);  Surgeon: Rachael Fee, MD;  Location: [facility] ENDOSCOPY;  Service: Endoscopy;  Laterality: N/A;   ESOPHAGOGASTRODUODENOSCOPY (EGD) WITH PROPOFOL N/A 09/30/2022   Procedure: ESOPHAGOGASTRODUODENOSCOPY (EGD) WITH PROPOFOL;  Surgeon: Tressia Danas, MD;  Location: [facility] ENDOSCOPY;  Service: Gastroenterology;  Laterality: N/A;   GIVENS CAPSULE STUDY  10/01/2022   Procedure: GIVENS CAPSULE STUDY;  Surgeon: Orvan Falconer,  Cala Bradford, MD;  Location: [facility] ENDOSCOPY;  Service: Gastroenterology;;   INGUINAL HERNIA REPAIR Bilateral ~ 2004   ROBOT ASSISTED LAPAROSCOPIC RADICAL PROSTATECTOMY  08/2009   Lance Rollins 08/14/2009 (08/14/2013)   TOTAL HIP ARTHROPLASTY Left 11/14/2013   Procedure: LEFT TOTAL HIP  ARTHROPLASTY ANTERIOR APPROACH;  Surgeon: Kathryne Hitch, MD;  Location: [facility] OR;  Service: Orthopedics;  Laterality: Left;   TOTAL HIP ARTHROPLASTY Right 04/23/2017   Procedure: RIGHT TOTAL HIP ARTHROPLASTY ANTERIOR APPROACH;  Surgeon: Kathryne Hitch, MD;  Location: [facility] ORS;  Service: Orthopedics;  Laterality: Right;    Family History  Problem Relation Age of Onset   Diabetes Sister    Diabetes Mother    Breast cancer Mother    Colon cancer Neg Hx    Stomach cancer Neg Hx    Esophageal cancer Neg Hx    Pancreatic cancer Neg Hx     Social History Social History   Tobacco Use   Smoking status: Never   Smokeless tobacco: Never  Vaping Use   Vaping status: Never Used  Substance Use Topics   Alcohol use: Yes    Comment: 08/14/2013 "might have a drink q other holiday"   Drug use: No    No Known Allergies  Current Outpatient Medications  Medication Sig Dispense Refill   cyclobenzaprine (FLEXERIL) 10 MG tablet Take 1 tablet (10 mg total) by mouth 3 (three) times daily as needed for muscle spasms (hip tightness). 60 tablet 11   ferrous sulfate 325 (65 FE) MG EC tablet Take 1 tablet (325 mg total) by mouth daily with breakfast. 30 tablet 0   irbesartan-hydrochlorothiazide (AVALIDE) 300-12.5 MG tablet Take 1 tablet by mouth daily.     Multiple Vitamin (MULTIVITAMIN WITH MINERALS) TABS tablet Take 1 tablet by mouth daily.     pantoprazole (PROTONIX) 40 MG tablet Take 1 tablet (40 mg total) by mouth daily. 30 tablet 0   simvastatin (ZOCOR) 40 MG tablet Take 40 mg by mouth daily.     No current facility-administered medications for this visit.    REVIEW OF SYSTEMS:   Review of Systems  Constitutional: Negative for appetite change, chills, fatigue, fever and unexpected weight change.  HENT:   Negative for mouth sores, nosebleeds, sore throat and trouble swallowing.   Eyes: Negative for eye problems and icterus.  Respiratory: Negative for cough, hemoptysis, shortness of  breath and wheezing.   Cardiovascular: Negative for chest pain and leg swelling.  Gastrointestinal: Negative for abdominal pain, constipation, diarrhea, nausea and vomiting.  Genitourinary: Negative for bladder incontinence, difficulty urinating, dysuria, frequency and hematuria.   Musculoskeletal: Negative for back pain, gait problem, neck pain and neck stiffness.  Skin: Negative for itching and rash.  Neurological: Negative for dizziness, extremity weakness, gait problem, headaches, light-headedness and seizures.  Hematological: Negative for adenopathy. Does not bruise/bleed easily.  Psychiatric/Behavioral: Negative for confusion, depression and sleep disturbance. The patient is not nervous/anxious.     PHYSICAL EXAMINATION:  There were no vitals taken for this visit.  ECOG PERFORMANCE STATUS: {CHL ONC ECOG [id number]  Physical Exam  Constitutional: Oriented to person, place, and time and well-developed, well-nourished, and in no distress. No distress.  HENT:  Head: Normocephalic and atraumatic.  Mouth/Throat: Oropharynx is clear and moist. No oropharyngeal exudate.  Eyes: Conjunctivae are normal. Right eye exhibits no discharge. Left eye exhibits no discharge. No scleral icterus.  Neck: Normal range of motion. Neck supple.  Cardiovascular: Normal rate, regular rhythm, normal heart sounds and intact distal pulses.  Pulmonary/Chest: Effort normal and breath sounds normal. No respiratory distress. No wheezes. No rales.  Abdominal: Soft. Bowel sounds are normal. Exhibits no distension and no mass. There is no tenderness.  Musculoskeletal: Normal range of motion. Exhibits no edema.  Lymphadenopathy:    No cervical adenopathy.  Neurological: Alert and oriented to person, place, and time. Exhibits normal muscle tone. Gait normal. Coordination normal.  Skin: Skin is warm and dry. No rash noted. Not diaphoretic. No erythema. No pallor.  Psychiatric: Mood, memory and judgment normal.   Vitals reviewed.  LABORATORY DATA: Lab Results  Component Value Date   WBC 8.5 03/01/2023   HGB 13.1 03/01/2023   HCT 41.1 03/01/2023   MCV 83.8 03/01/2023   PLT 348.0 03/01/2023      Chemistry      Component Value Date/Time   NA 132 (L) 10/02/2022 0020   K 3.6 10/02/2022 0020   CL 100 10/02/2022 0020   CO2 23 10/02/2022 0020   BUN 8 10/02/2022 0020   CREATININE 1.13 10/02/2022 0020      Component Value Date/Time   CALCIUM 9.0 10/02/2022 0020   ALKPHOS 60 09/29/2022 1949   AST 22 09/29/2022 1949   ALT 21 09/29/2022 1949   BILITOT 0.3 09/29/2022 1949       RADIOGRAPHIC STUDIES: No results found.  ASSESSMENT: Is a very pleasant 76 year old African-American male referred to the clinic for iron deficiency anemia.  The patient is here to establish care.  He is currently taking an iron supplement every other day. ***I encouraged him to take this daily if possible with orange juice to help with iron absorption.  The patient had a CBC, CMP, iron studies, ferritin, SPEP with immunofixation, B12, and folate testing performed today.  The patient's labs show***  Even with a mildly low iron saturation and low end of normal ferritin and iron, we would recommend avoiding IV iron at this time in the setting of normal hemoglobin.  We will continue to monitor him closely and arrange for IV iron if needed.  We will see the patient back for labs and a follow-up visit in 3 months  for close monitoring.  The patient voices understanding of current disease status and treatment options and is in agreement with the current care plan.  All questions were answered. The patient knows to call the clinic with any problems, questions or concerns. We can certainly see the patient much sooner if necessary.  Thank you so much for allowing me to participate in the care of Lance Rollins. I will continue to follow up the patient with you and assist in his care.  I spent {CHL ONC TIME VISIT -  YQIHK:[id number]} counseling the patient face to face. The total time spent in the appointment was {CHL ONC TIME VISIT - FIEPP:[id number]}.  Disclaimer: This note was dictated with voice recognition software. Similar sounding words can inadvertently be transcribed and may not be corrected upon review.   Shakala Marlatt L Hazelle Woollard March 05, 2023, 2:11 PM

## 2023-03-06 ENCOUNTER — Telehealth: Payer: Self-pay | Admitting: Physician Assistant

## 2023-03-06 ENCOUNTER — Inpatient Hospital Stay: Payer: Medicare Other

## 2023-03-06 ENCOUNTER — Inpatient Hospital Stay: Payer: Medicare Other | Attending: Hematology and Oncology | Admitting: Physician Assistant

## 2023-03-06 VITALS — BP 153/69 | HR 75 | Temp 97.5°F | Resp 17 | Ht 68.0 in | Wt 222.9 lb

## 2023-03-06 DIAGNOSIS — D509 Iron deficiency anemia, unspecified: Secondary | ICD-10-CM | POA: Insufficient documentation

## 2023-03-06 DIAGNOSIS — Z79899 Other long term (current) drug therapy: Secondary | ICD-10-CM | POA: Diagnosis not present

## 2023-03-06 DIAGNOSIS — Z9884 Bariatric surgery status: Secondary | ICD-10-CM | POA: Diagnosis not present

## 2023-03-06 DIAGNOSIS — Z8546 Personal history of malignant neoplasm of prostate: Secondary | ICD-10-CM | POA: Diagnosis not present

## 2023-03-06 DIAGNOSIS — G894 Chronic pain syndrome: Secondary | ICD-10-CM | POA: Diagnosis not present

## 2023-03-06 DIAGNOSIS — D649 Anemia, unspecified: Secondary | ICD-10-CM

## 2023-03-06 LAB — CBC WITH DIFFERENTIAL (CANCER CENTER ONLY)
Abs Immature Granulocytes: 0.02 10*3/uL (ref 0.00–0.07)
Basophils Absolute: 0 10*3/uL (ref 0.0–0.1)
Basophils Relative: 1 %
Eosinophils Absolute: 0.2 10*3/uL (ref 0.0–0.5)
Eosinophils Relative: 3 %
HCT: 37.6 % — ABNORMAL LOW (ref 39.0–52.0)
Hemoglobin: 12.2 g/dL — ABNORMAL LOW (ref 13.0–17.0)
Immature Granulocytes: 0 %
Lymphocytes Relative: 32 %
Lymphs Abs: 2.2 10*3/uL (ref 0.7–4.0)
MCH: 27.2 pg (ref 26.0–34.0)
MCHC: 32.4 g/dL (ref 30.0–36.0)
MCV: 83.7 fL (ref 80.0–100.0)
Monocytes Absolute: 0.7 10*3/uL (ref 0.1–1.0)
Monocytes Relative: 10 %
Neutro Abs: 3.8 10*3/uL (ref 1.7–7.7)
Neutrophils Relative %: 54 %
Platelet Count: 310 10*3/uL (ref 150–400)
RBC: 4.49 MIL/uL (ref 4.22–5.81)
RDW: 15.6 % — ABNORMAL HIGH (ref 11.5–15.5)
WBC Count: 7 10*3/uL (ref 4.0–10.5)
nRBC: 0 % (ref 0.0–0.2)

## 2023-03-06 LAB — IRON AND IRON BINDING CAPACITY (CC-WL,HP ONLY)
Iron: 66 ug/dL (ref 45–182)
Saturation Ratios: 17 % — ABNORMAL LOW (ref 17.9–39.5)
TIBC: 386 ug/dL (ref 250–450)
UIBC: 320 ug/dL (ref 117–376)

## 2023-03-06 LAB — CMP (CANCER CENTER ONLY)
ALT: 21 U/L (ref 0–44)
AST: 17 U/L (ref 15–41)
Albumin: 3.8 g/dL (ref 3.5–5.0)
Alkaline Phosphatase: 82 U/L (ref 38–126)
Anion gap: 7 (ref 5–15)
BUN: 26 mg/dL — ABNORMAL HIGH (ref 8–23)
CO2: 29 mmol/L (ref 22–32)
Calcium: 9.7 mg/dL (ref 8.9–10.3)
Chloride: 103 mmol/L (ref 98–111)
Creatinine: 0.97 mg/dL (ref 0.61–1.24)
GFR, Estimated: >60 mL/min (ref >60)
Glucose, Bld: 85 mg/dL (ref 70–99)
Potassium: 3.9 mmol/L (ref 3.5–5.1)
Sodium: 139 mmol/L (ref 135–145)
Total Bilirubin: 0.7 mg/dL (ref 0.3–1.2)
Total Protein: 7.4 g/dL (ref 6.5–8.1)

## 2023-03-06 LAB — VITAMIN B12: Vitamin B-12: 597 pg/mL (ref 180–914)

## 2023-03-06 LAB — FERRITIN: Ferritin: 34 ng/mL (ref 24–336)

## 2023-03-06 LAB — FOLATE: Folate: 15.1 ng/mL (ref >5.9)

## 2023-03-06 NOTE — Telephone Encounter (Signed)
Scheduled appointments per  7/27 los. Left voicemail.

## 2023-03-08 ENCOUNTER — Telehealth: Payer: Self-pay | Admitting: Physician Assistant

## 2023-03-08 NOTE — Telephone Encounter (Signed)
I called the patient about his iron studies and ferritin.  We would not recommend any IV iron at this time.  We  recommend that he continue taking iron supplement with vitamin C.  We will recheck his labs in 3 months and may consider IV iron if worsening anemia or ferritin at that time.

## 2023-03-15 ENCOUNTER — Telehealth: Payer: Self-pay | Admitting: Internal Medicine

## 2023-03-15 ENCOUNTER — Telehealth: Payer: Self-pay | Admitting: Physician Assistant

## 2023-03-15 DIAGNOSIS — D649 Anemia, unspecified: Secondary | ICD-10-CM

## 2023-03-15 LAB — PROTEIN ELECTROPHORESIS, SERUM, WITH REFLEX
A/G Ratio: 0.8 (ref 0.7–1.7)
Albumin ELP: 3.2 g/dL (ref 2.9–4.4)
Alpha-1-Globulin: 0.2 g/dL (ref 0.0–0.4)
Alpha-2-Globulin: 0.9 g/dL (ref 0.4–1.0)
Beta Globulin: 1.2 g/dL (ref 0.7–1.3)
Gamma Globulin: 1.7 g/dL (ref 0.4–1.8)
Globulin, Total: 4 g/dL — ABNORMAL HIGH (ref 2.2–3.9)
M-Spike, %: 0.6 g/dL — ABNORMAL HIGH
SPEP Interpretation: 0
Total Protein ELP: 7.2 g/dL (ref 6.0–8.5)

## 2023-03-15 LAB — IMMUNOFIXATION REFLEX, SERUM
IgA: 326 mg/dL (ref 61–437)
IgG (Immunoglobin G), Serum: 1679 mg/dL — ABNORMAL HIGH (ref 603–1613)
IgM (Immunoglobulin M), Srm: 190 mg/dL — ABNORMAL HIGH (ref 15–143)

## 2023-03-15 NOTE — Telephone Encounter (Signed)
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  His M spike was 0.6.  Therefore I would like for him to come in for more lab work.  I talked to the patient and his wife and explained this.  They are agreeable to come in for further lab work.  I will arrange for lab visit on 03/19/2023 and a follow-up visit approximately 2 weeks later to review the results. Scheduling message sent

## 2023-03-15 NOTE — Telephone Encounter (Signed)
Scheduled per 08/05 scheduling message, patient has been called and notified of upcoming appointments.

## 2023-03-18 ENCOUNTER — Other Ambulatory Visit: Payer: Self-pay | Admitting: Medical Oncology

## 2023-03-18 DIAGNOSIS — D649 Anemia, unspecified: Secondary | ICD-10-CM

## 2023-03-19 ENCOUNTER — Inpatient Hospital Stay: Payer: Medicare Other | Attending: Hematology and Oncology

## 2023-03-19 ENCOUNTER — Other Ambulatory Visit: Payer: Self-pay

## 2023-03-19 DIAGNOSIS — K2970 Gastritis, unspecified, without bleeding: Secondary | ICD-10-CM | POA: Diagnosis not present

## 2023-03-19 DIAGNOSIS — D649 Anemia, unspecified: Secondary | ICD-10-CM

## 2023-03-19 DIAGNOSIS — D472 Monoclonal gammopathy: Secondary | ICD-10-CM | POA: Diagnosis not present

## 2023-03-19 DIAGNOSIS — D509 Iron deficiency anemia, unspecified: Secondary | ICD-10-CM | POA: Insufficient documentation

## 2023-03-19 LAB — CBC WITH DIFFERENTIAL/PLATELET
Abs Immature Granulocytes: 0.03 10*3/uL (ref 0.00–0.07)
Basophils Absolute: 0 10*3/uL (ref 0.0–0.1)
Basophils Relative: 1 %
Eosinophils Absolute: 0.2 10*3/uL (ref 0.0–0.5)
Eosinophils Relative: 3 %
HCT: 36.7 % — ABNORMAL LOW (ref 39.0–52.0)
Hemoglobin: 12 g/dL — ABNORMAL LOW (ref 13.0–17.0)
Immature Granulocytes: 0 %
Lymphocytes Relative: 31 %
Lymphs Abs: 2.2 10*3/uL (ref 0.7–4.0)
MCH: 27.4 pg (ref 26.0–34.0)
MCHC: 32.7 g/dL (ref 30.0–36.0)
MCV: 83.8 fL (ref 80.0–100.0)
Monocytes Absolute: 0.9 10*3/uL (ref 0.1–1.0)
Monocytes Relative: 12 %
Neutro Abs: 3.7 10*3/uL (ref 1.7–7.7)
Neutrophils Relative %: 53 %
Platelets: 291 10*3/uL (ref 150–400)
RBC: 4.38 MIL/uL (ref 4.22–5.81)
RDW: 16.1 % — ABNORMAL HIGH (ref 11.5–15.5)
WBC: 7.1 10*3/uL (ref 4.0–10.5)
nRBC: 0 % (ref 0.0–0.2)

## 2023-03-19 LAB — CMP (CANCER CENTER ONLY)
ALT: 25 U/L (ref 0–44)
AST: 20 U/L (ref 15–41)
Albumin: 3.8 g/dL (ref 3.5–5.0)
Alkaline Phosphatase: 91 U/L (ref 38–126)
Anion gap: 4 — ABNORMAL LOW (ref 5–15)
BUN: 22 mg/dL (ref 8–23)
CO2: 32 mmol/L (ref 22–32)
Calcium: 9.4 mg/dL (ref 8.9–10.3)
Chloride: 101 mmol/L (ref 98–111)
Creatinine: 1.06 mg/dL (ref 0.61–1.24)
GFR, Estimated: >60 mL/min (ref >60)
Glucose, Bld: 86 mg/dL (ref 70–99)
Potassium: 4.3 mmol/L (ref 3.5–5.1)
Sodium: 137 mmol/L (ref 135–145)
Total Bilirubin: 0.6 mg/dL (ref 0.3–1.2)
Total Protein: 7.4 g/dL (ref 6.5–8.1)

## 2023-03-19 LAB — LACTATE DEHYDROGENASE: LDH: 190 U/L (ref 98–192)

## 2023-03-21 LAB — IGG, IGA, IGM
IgA: 293 mg/dL (ref 61–437)
IgG (Immunoglobin G), Serum: 1558 mg/dL (ref 603–1613)
IgM (Immunoglobulin M), Srm: 168 mg/dL — ABNORMAL HIGH (ref 15–143)

## 2023-03-23 ENCOUNTER — Other Ambulatory Visit: Payer: Self-pay | Admitting: Physical Medicine and Rehabilitation

## 2023-03-23 DIAGNOSIS — M7061 Trochanteric bursitis, right hip: Secondary | ICD-10-CM

## 2023-03-23 NOTE — Telephone Encounter (Signed)
Voltaren was d/c'ed at discharge in Feb 2024.

## 2023-04-01 ENCOUNTER — Inpatient Hospital Stay: Payer: Medicare Other | Admitting: Internal Medicine

## 2023-04-01 VITALS — BP 129/57 | HR 80 | Temp 97.5°F | Resp 17 | Ht 68.0 in | Wt 225.0 lb

## 2023-04-01 DIAGNOSIS — K2970 Gastritis, unspecified, without bleeding: Secondary | ICD-10-CM | POA: Diagnosis not present

## 2023-04-01 DIAGNOSIS — D472 Monoclonal gammopathy: Secondary | ICD-10-CM | POA: Diagnosis not present

## 2023-04-01 DIAGNOSIS — D509 Iron deficiency anemia, unspecified: Secondary | ICD-10-CM | POA: Diagnosis not present

## 2023-04-01 NOTE — Progress Notes (Signed)
[facility] Telephone:([phone]   Fax:([phone]  OFFICE PROGRESS NOTE  Daisy Floro, MD [address] 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 2) monoclonal gammopathy of undetermined significance.  PRIOR THERAPY: None  CURRENT THERAPY: Over-the-counter oral iron tablet with vitamin C.  INTERVAL HISTORY: Lance Rollins 77 y.o. male returns to the clinic today for follow-up visit accompanied by his wife.  The patient is feeling fine today with no concerning complaints.  He exercises at regular basis.  He denied having any current chest pain, shortness of breath, cough or hemoptysis.  He has no nausea, vomiting, diarrhea or constipation.  He has no headache or visual changes.  He was found on recent blood work to have elevated M spike and quantitative immunoglobulins showed elevated free kappa light chain of 68.5.  He is here today for evaluation and recommendation regarding his condition.  MEDICAL HISTORY: Past Medical History:  Diagnosis Date   Arthritis    Bleeding per rectum 08/2013   "black and bright red" (08/14/2013)   Hypercholesteremia    Hypertension    Obesity    Prostate cancer (HCC) 2011   Unilateral primary osteoarthritis, right hip 04/23/2017    ALLERGIES:  has No Known Allergies.  MEDICATIONS:  Current Outpatient Medications  Medication Sig Dispense Refill   cyclobenzaprine (FLEXERIL) 10 MG tablet Take 1 tablet (10 mg total) by mouth 3 (three) times daily as needed for muscle spasms (hip tightness). 60 tablet 11   diclofenac (VOLTAREN) 75 MG EC tablet Take 1 tablet (75 mg total) by mouth daily as needed for mild pain. 30 tablet 8   ferrous sulfate 325 (65 FE) MG EC tablet Take 1 tablet (325 mg total) by mouth daily with breakfast. 30 tablet 0   irbesartan-hydrochlorothiazide (AVALIDE) 300-12.5 MG tablet Take 1 tablet by mouth daily.     Multiple Vitamin (MULTIVITAMIN WITH MINERALS)  TABS tablet Take 1 tablet by mouth daily.     pantoprazole (PROTONIX) 40 MG tablet Take 1 tablet (40 mg total) by mouth daily. 30 tablet 0   simvastatin (ZOCOR) 40 MG tablet Take 40 mg by mouth daily.     No current facility-administered medications for this visit.    SURGICAL HISTORY:  Past Surgical History:  Procedure Laterality Date   BIOPSY  09/30/2022   Procedure: BIOPSY;  Surgeon: Tressia Danas, MD;  Location: [facility] ENDOSCOPY;  Service: Gastroenterology;;   COLONOSCOPY WITH PROPOFOL N/A 10/01/2022   Procedure: COLONOSCOPY WITH PROPOFOL;  Surgeon: Tressia Danas, MD;  Location: [facility] ENDOSCOPY;  Service: Gastroenterology;  Laterality: N/A;   ESOPHAGOGASTRODUODENOSCOPY N/A 08/15/2013   Procedure: ESOPHAGOGASTRODUODENOSCOPY (EGD);  Surgeon: Rachael Fee, MD;  Location: [facility] ENDOSCOPY;  Service: Endoscopy;  Laterality: N/A;   ESOPHAGOGASTRODUODENOSCOPY (EGD) WITH PROPOFOL N/A 09/30/2022   Procedure: ESOPHAGOGASTRODUODENOSCOPY (EGD) WITH PROPOFOL;  Surgeon: Tressia Danas, MD;  Location: [facility] ENDOSCOPY;  Service: Gastroenterology;  Laterality: N/A;   GIVENS CAPSULE STUDY  10/01/2022   Procedure: GIVENS CAPSULE STUDY;  Surgeon: Tressia Danas, MD;  Location: [facility] ENDOSCOPY;  Service: Gastroenterology;;   INGUINAL HERNIA REPAIR Bilateral ~ 2004   ROBOT ASSISTED LAPAROSCOPIC RADICAL PROSTATECTOMY  08/2009   Hattie Perch 08/14/2009 (08/14/2013)   TOTAL HIP ARTHROPLASTY Left 11/14/2013   Procedure: LEFT TOTAL HIP ARTHROPLASTY ANTERIOR APPROACH;  Surgeon: Kathryne Hitch, MD;  Location: [facility] OR;  Service: Orthopedics;  Laterality: Left;   TOTAL HIP ARTHROPLASTY Right 04/23/2017   Procedure: RIGHT TOTAL HIP  ARTHROPLASTY ANTERIOR APPROACH;  Surgeon: Kathryne Hitch, MD;  Location: [facility] ORS;  Service: Orthopedics;  Laterality: Right;    REVIEW OF SYSTEMS:  Constitutional: negative Eyes: negative Ears, nose, mouth, throat, and face: negative Respiratory: negative Cardiovascular:  negative Gastrointestinal: negative Genitourinary:negative Integument/breast: negative Hematologic/lymphatic: negative Musculoskeletal:negative Neurological: negative Behavioral/Psych: negative Endocrine: negative Allergic/Immunologic: negative   PHYSICAL EXAMINATION: General appearance: alert, cooperative, and no distress Head: Normocephalic, without obvious abnormality, atraumatic Neck: no adenopathy, no JVD, supple, symmetrical, trachea midline, and thyroid not enlarged, symmetric, no tenderness/mass/nodules Lymph nodes: Cervical, supraclavicular, and axillary nodes normal. Resp: clear to auscultation bilaterally Back: symmetric, no curvature. ROM normal. No CVA tenderness. Cardio: regular rate and rhythm, S1, S2 normal, no murmur, click, rub or gallop GI: soft, non-tender; bowel sounds normal; no masses,  no organomegaly Extremities: extremities normal, atraumatic, no cyanosis or edema Neurologic: Alert and oriented X 3, normal strength and tone. Normal symmetric reflexes. Normal coordination and gait  ECOG PERFORMANCE STATUS: 0 - Asymptomatic  Blood pressure (!) 129/57, pulse 80, temperature (!) 97.5 F (36.4 C), temperature source Oral, resp. rate 17, height 5\' 8"  (1.727 m), weight 225 lb (102.1 kg), SpO2 100%.  LABORATORY DATA: Lab Results  Component Value Date   WBC 7.1 03/19/2023   HGB 12.0 (L) 03/19/2023   HCT 36.7 (L) 03/19/2023   MCV 83.8 03/19/2023   PLT 291 03/19/2023      Chemistry      Component Value Date/Time   NA 137 03/19/2023 1158   K 4.3 03/19/2023 1158   CL 101 03/19/2023 1158   CO2 32 03/19/2023 1158   BUN 22 03/19/2023 1158   CREATININE 1.06 03/19/2023 1158      Component Value Date/Time   CALCIUM 9.4 03/19/2023 1158   ALKPHOS 91 03/19/2023 1158   AST 20 03/19/2023 1158   ALT 25 03/19/2023 1158   BILITOT 0.6 03/19/2023 1158       RADIOGRAPHIC STUDIES: No results found.  ASSESSMENT AND PLAN: This is a very pleasant 77 years old  African-American male who was evaluated for iron deficiency anemia secondary to gastritis and incidentally he was found to have elevated M spike with free kappa light chain of 68.5%. I had a lengthy discussion with the patient and his wife today about his current condition and further investigation to rule out multiple myeloma. I recommended for the patient to have a bone marrow biopsy and aspirate to rule out multiple myeloma.  If the biopsy is concerning, I will order additional studies including whole-body PET scan to identify any lytic lesions. For the anemia, the patient will continue on his oral iron tablet for now. I will see him back for follow-up visit in 1 months for evaluation and discussion of his biopsy results. He was advised to call immediately if he has any other concerning symptoms in the interval. The patient voices understanding of current disease status and treatment options and is in agreement with the current care plan.  All questions were answered. The patient knows to call the clinic with any problems, questions or concerns. We can certainly see the patient much sooner if necessary.  The total time spent in the appointment was 30 minutes.  Disclaimer: This note was dictated with voice recognition software. Similar sounding words can inadvertently be transcribed and may not be corrected upon review.

## 2023-04-02 ENCOUNTER — Other Ambulatory Visit: Payer: Self-pay | Admitting: Radiology

## 2023-04-02 DIAGNOSIS — D472 Monoclonal gammopathy: Secondary | ICD-10-CM

## 2023-04-04 NOTE — H&P (Signed)
Chief Complaint: Patient was seen in consultation today for monoclonal gammopathy of undetermined significance.   Referring Physician(s): Mohamed,Mohamed  Supervising Physician: Marliss Coots  Patient Status: [facility] - Out-pt  History of Present Illness: Lance Rollins is a 77 y.o. male with past medical history of HTN, arthritis, remote prostate cancer who was recently found to have elevated M spike and quantitative immunoglobulins/free light chain on lab work.  He was seen in consultation with Oncology for possible myeloma and has been referred to IR for bone marrow biopsy.    Mr.  Rollins presents today in his usual state of health.  He has been well at home.  He has no concerns today.  He has been NPO.  Does not take blood thinners.  His wife is available for post-procedure care and transportation.   Past Medical History:  Diagnosis Date   Arthritis    Bleeding per rectum 08/2013   "black and bright red" (08/14/2013)   Hypercholesteremia    Hypertension    Obesity    Prostate cancer (HCC) 2011   Unilateral primary osteoarthritis, right hip 04/23/2017    Past Surgical History:  Procedure Laterality Date   BIOPSY  09/30/2022   Procedure: BIOPSY;  Surgeon: Tressia Danas, MD;  Location: [facility] ENDOSCOPY;  Service: Gastroenterology;;   COLONOSCOPY WITH PROPOFOL N/A 10/01/2022   Procedure: COLONOSCOPY WITH PROPOFOL;  Surgeon: Tressia Danas, MD;  Location: [facility] ENDOSCOPY;  Service: Gastroenterology;  Laterality: N/A;   ESOPHAGOGASTRODUODENOSCOPY N/A 08/15/2013   Procedure: ESOPHAGOGASTRODUODENOSCOPY (EGD);  Surgeon: Rachael Fee, MD;  Location: [facility] ENDOSCOPY;  Service: Endoscopy;  Laterality: N/A;   ESOPHAGOGASTRODUODENOSCOPY (EGD) WITH PROPOFOL N/A 09/30/2022   Procedure: ESOPHAGOGASTRODUODENOSCOPY (EGD) WITH PROPOFOL;  Surgeon: Tressia Danas, MD;  Location: [facility] ENDOSCOPY;  Service: Gastroenterology;  Laterality: N/A;   GIVENS CAPSULE STUDY  10/01/2022   Procedure: GIVENS CAPSULE  STUDY;  Surgeon: Tressia Danas, MD;  Location: [facility] ENDOSCOPY;  Service: Gastroenterology;;   INGUINAL HERNIA REPAIR Bilateral ~ 2004   ROBOT ASSISTED LAPAROSCOPIC RADICAL PROSTATECTOMY  08/2009   Hattie Perch 08/14/2009 (08/14/2013)   TOTAL HIP ARTHROPLASTY Left 11/14/2013   Procedure: LEFT TOTAL HIP ARTHROPLASTY ANTERIOR APPROACH;  Surgeon: Kathryne Hitch, MD;  Location: [facility] OR;  Service: Orthopedics;  Laterality: Left;   TOTAL HIP ARTHROPLASTY Right 04/23/2017   Procedure: RIGHT TOTAL HIP ARTHROPLASTY ANTERIOR APPROACH;  Surgeon: Kathryne Hitch, MD;  Location: [facility] ORS;  Service: Orthopedics;  Laterality: Right;    Allergies: Patient has no known allergies.  Medications: Prior to Admission medications   Medication Sig Start Date End Date Taking? Authorizing Provider  cyclobenzaprine (FLEXERIL) 10 MG tablet Take 1 tablet (10 mg total) by mouth 3 (three) times daily as needed for muscle spasms (hip tightness). 09/21/22   Lovorn, Aundra Millet, MD  diclofenac (VOLTAREN) 75 MG EC tablet Take 1 tablet (75 mg total) by mouth daily as needed for mild pain. 03/23/23   Lovorn, Aundra Millet, MD  ferrous sulfate 325 (65 FE) MG EC tablet Take 1 tablet (325 mg total) by mouth daily with breakfast. 10/02/22 11/01/22  Glade Lloyd, MD  irbesartan-hydrochlorothiazide (AVALIDE) 300-12.5 MG tablet Take 1 tablet by mouth daily. 11/03/19   [provider]  Multiple Vitamin (MULTIVITAMIN WITH MINERALS) TABS tablet Take 1 tablet by mouth daily.    [provider]  pantoprazole (PROTONIX) 40 MG tablet Take 1 tablet (40 mg total) by mouth daily. 11/26/22 12/26/22  Unk Lightning, PA  simvastatin (ZOCOR) 40 MG tablet Take 40 mg by mouth daily. 08/09/19  [provider]     Family History  Problem Relation Age of Onset   Diabetes Sister    Diabetes Mother    Breast cancer Mother    Colon cancer Neg Hx    Stomach cancer Neg Hx    Esophageal cancer Neg Hx    Pancreatic cancer Neg Hx      Social History   Socioeconomic History   Marital status: Married    Spouse name: Not on file   Number of children: 5   Years of education: Not on file   Highest education level: Not on file  Occupational History   Occupation: retired  Tobacco Use   Smoking status: Never   Smokeless tobacco: Never  Vaping Use   Vaping status: Never Used  Substance and Sexual Activity   Alcohol use: Yes    Comment: 08/14/2013 "might have a drink q other holiday"   Drug use: No   Sexual activity: Not Currently  Other Topics Concern   Not on file  Social History Narrative   Not on file   Social Determinants of Health   Financial Resource Strain: Not on file  Food Insecurity: No Food Insecurity (09/30/2022)   Hunger Vital Sign    Worried About Running Out of Food in the Last Year: Never true    Ran Out of Food in the Last Year: Never true  Transportation Needs: No Transportation Needs (09/30/2022)   PRAPARE - Administrator, Civil Service (Medical): No    Lack of Transportation (Non-Medical): No  Physical Activity: Not on file  Stress: Not on file  Social Connections: Not on file     Review of Systems: A 12 point ROS discussed and pertinent positives are indicated in the HPI above.  All other systems are negative.  Review of Systems  Vital Signs: There were no vitals taken for this visit.  Physical Exam       Imaging: No results found.  Labs:  CBC: Recent Labs    11/25/22 1125 03/01/23 0724 03/06/23 1120 03/19/23 1203  WBC 7.2 8.5 7.0 7.1  HGB 11.2* 13.1 12.2* 12.0*  HCT 34.7* 41.1 37.6* 36.7*  PLT 389.0 348.0 310 291    COAGS: No results for input(s): "INR", "APTT" in the last 8760 hours.  BMP: Recent Labs    10/01/22 0024 10/02/22 0020 03/06/23 1120 03/19/23 1158  NA 135 132* 139 137  K 3.7 3.6 3.9 4.3  CL 104 100 103 101  CO2 21* 23 29 32  GLUCOSE 92 133* 85 86  BUN 11 8 26* 22  CALCIUM 8.7* 9.0 9.7 9.4  CREATININE 0.96 1.13 0.97  1.06  GFRNONAA >60 >60 >60 >60    LIVER FUNCTION TESTS: Recent Labs    09/29/22 1949 03/06/23 1120 03/19/23 1158  BILITOT 0.3 0.7 0.6  AST 22 17 20   ALT 21 21 25   ALKPHOS 60 82 91  PROT 7.4 7.4 7.4  ALBUMIN 3.4* 3.8 3.8    TUMOR MARKERS: No results for input(s): "AFPTM", "CEA", "CA199", "CHROMGRNA" in the last 8760 hours.  Assessment and Plan: Patient with past medical history of HTN, athritis presents with complaint of abnormal monoclonal gammopathy on recent labwork.  IR consulted for bone marrow biopsy at the request of Dr. Arbutus Ped. Case reviewed by Dr. Elby Showers who approves patient for procedure.  Patient presents today in their usual state of health.  He has been NPO and is not currently on blood thinners.   Risks and  benefits of biopsy was discussed with the patient and/or patient's family including, but not limited to bleeding, infection, damage to adjacent structures or low yield requiring additional tests.  All of the questions were answered and there is agreement to proceed.  Consent signed and in chart.  Advance Care Plan: The advanced care plan/surrogate decision maker was discussed at the time of visit and documented in the medical record.     Thank you for this interesting consult.  I greatly enjoyed meeting Niran Coney and look forward to participating in their care.  A copy of this report was sent to the requesting provider on this date.  Electronically Signed: Hoyt Koch, PA 04/04/2023, 2:53 PM   I spent a total of  30 Minutes   in face to face in clinical consultation, greater than 50% of which was counseling/coordinating care for monoclonal gammopathy of undetermined significance.

## 2023-04-05 ENCOUNTER — Encounter (HOSPITAL_COMMUNITY): Payer: Self-pay

## 2023-04-05 ENCOUNTER — Ambulatory Visit (HOSPITAL_COMMUNITY): Admission: RE | Admit: 2023-04-05 | Payer: Medicare Other | Source: Ambulatory Visit

## 2023-04-05 ENCOUNTER — Ambulatory Visit (HOSPITAL_COMMUNITY)
Admission: RE | Admit: 2023-04-05 | Discharge: 2023-04-05 | Disposition: A | Discharge status: Home / Self Care | Payer: Medicare Other | Source: Ambulatory Visit | Attending: Internal Medicine | Admitting: Internal Medicine

## 2023-04-05 ENCOUNTER — Other Ambulatory Visit: Payer: Self-pay

## 2023-04-05 DIAGNOSIS — D472 Monoclonal gammopathy: Secondary | ICD-10-CM | POA: Diagnosis not present

## 2023-04-05 DIAGNOSIS — D649 Anemia, unspecified: Secondary | ICD-10-CM | POA: Diagnosis not present

## 2023-04-05 LAB — CBC WITH DIFFERENTIAL/PLATELET
Abs Immature Granulocytes: 0.03 10*3/uL (ref 0.00–0.07)
Basophils Absolute: 0.1 10*3/uL (ref 0.0–0.1)
Basophils Relative: 1 %
Eosinophils Absolute: 0.2 10*3/uL (ref 0.0–0.5)
Eosinophils Relative: 3 %
HCT: 38.7 % — ABNORMAL LOW (ref 39.0–52.0)
Hemoglobin: 12.2 g/dL — ABNORMAL LOW (ref 13.0–17.0)
Immature Granulocytes: 0 %
Lymphocytes Relative: 26 %
Lymphs Abs: 1.8 10*3/uL (ref 0.7–4.0)
MCH: 27.1 pg (ref 26.0–34.0)
MCHC: 31.5 g/dL (ref 30.0–36.0)
MCV: 86 fL (ref 80.0–100.0)
Monocytes Absolute: 0.6 10*3/uL (ref 0.1–1.0)
Monocytes Relative: 9 %
Neutro Abs: 4.2 10*3/uL (ref 1.7–7.7)
Neutrophils Relative %: 61 %
Platelets: 317 10*3/uL (ref 150–400)
RBC: 4.5 MIL/uL (ref 4.22–5.81)
RDW: 16.4 % — ABNORMAL HIGH (ref 11.5–15.5)
WBC: 6.9 10*3/uL (ref 4.0–10.5)
nRBC: 0 % (ref 0.0–0.2)

## 2023-04-05 MED ORDER — MIDAZOLAM HCL 2 MG/2ML IJ SOLN
INTRAMUSCULAR | Status: AC | PRN
Start: 2023-04-05 — End: 2023-04-05
  Administered 2023-04-05: 1 mg via INTRAVENOUS

## 2023-04-05 MED ORDER — NALOXONE HCL 0.4 MG/ML IJ SOLN
INTRAMUSCULAR | Status: AC
  Filled 2023-04-05: qty 1

## 2023-04-05 MED ORDER — FENTANYL CITRATE (PF) 100 MCG/2ML IJ SOLN
INTRAMUSCULAR | Status: AC
  Filled 2023-04-05: qty 4

## 2023-04-05 MED ORDER — MIDAZOLAM HCL 2 MG/2ML IJ SOLN
INTRAMUSCULAR | Status: AC
  Filled 2023-04-05: qty 2

## 2023-04-05 MED ORDER — FENTANYL CITRATE (PF) 100 MCG/2ML IJ SOLN
INTRAMUSCULAR | Status: AC | PRN
  Administered 2023-04-05: 50 ug via INTRAVENOUS

## 2023-04-05 MED ORDER — SODIUM CHLORIDE 0.9 % IV SOLN: INTRAVENOUS | Status: DC

## 2023-04-05 MED ORDER — FLUMAZENIL 0.5 MG/5ML IV SOLN
INTRAVENOUS | Status: AC
  Filled 2023-04-05: qty 5

## 2023-04-05 NOTE — Discharge Instructions (Signed)
Please call Interventional Radiology clinic [phone] with any questions or concerns. ? ?You may remove your dressing and shower tomorrow. ? ? ?Bone Marrow Aspiration and Bone Marrow Biopsy, Adult, Care After ?This sheet gives you information about how to care for yourself after your procedure. Your health care provider may also give you more specific instructions. If you have problems or questions, contact your health care provider. ?What can I expect after the procedure? ?After the procedure, it is common to have: ?Mild pain and tenderness. ?Swelling. ?Bruising. ?Follow these instructions at home: ?Puncture site care ?Follow instructions from your health care provider about how to take care of the puncture site. Make sure you: ?Wash your hands with soap and water before and after you change your bandage (dressing). If soap and water are not available, use hand sanitizer. ?Change your dressing as told by your health care provider. ?Check your puncture site every day for signs of infection. Check for: ?More redness, swelling, or pain. ?Fluid or blood. ?Warmth. ?Pus or a bad smell.   ?Activity ?Return to your normal activities as told by your health care provider. Ask your health care provider what activities are safe for you. ?Do not lift anything that is heavier than 10 lb (4.5 kg), or the limit that you are told, until your health care provider says that it is safe. ?Do not drive for 24 hours if you were given a sedative during your procedure. ?General instructions ?Take over-the-counter and prescription medicines only as told by your health care provider. ?Do not take baths, swim, or use a hot tub until your health care provider approves. Ask your health care provider if you may take showers. You may only be allowed to take sponge baths. ?If directed, put ice on the affected area. To do this: ?Put ice in a plastic bag. ?Place a towel between your skin and the bag. ?Leave the ice on for 20 minutes, 2-3 times a  day. ?Keep all follow-up visits as told by your health care provider. This is important.   ?Contact a health care provider if: ?Your pain is not controlled with medicine. ?You have a fever. ?You have more redness, swelling, or pain around the puncture site. ?You have fluid or blood coming from the puncture site. ?Your puncture site feels warm to the touch. ?You have pus or a bad smell coming from the puncture site. ?Summary ?After the procedure, it is common to have mild pain, tenderness, swelling, and bruising. ?Follow instructions from your health care provider about how to take care of the puncture site and what activities are safe for you. ?Take over-the-counter and prescription medicines only as told by your health care provider. ?Contact a health care provider if you have any signs of infection, such as fluid or blood coming from the puncture site. ?This information is not intended to replace advice given to you by your health care provider. Make sure you discuss any questions you have with your health care provider. ?Document Revised: 12/13/2018 Document Reviewed: 12/13/2018 ?Elsevier Patient Education ? 2021 Elsevier Inc[employer] ? ? ?Moderate Conscious Sedation, Adult, Care After ?This sheet gives you information about how to care for yourself after your procedure. Your health care provider may also give you more specific instructions. If you have problems or questions, contact your health care provider. ?What can I expect after the procedure? ?After the procedure, it is common to have: ?Sleepiness for several hours. ?Impaired judgment for several hours. ?Difficulty with balance. ?Vomiting if you eat too   soon. ?Follow these instructions at home: ?For the time period you were told by your health care provider: ?Rest. ?Do not participate in activities where you could fall or become injured. ?Do not drive or use machinery. ?Do not drink alcohol. ?Do not take sleeping pills or medicines that cause drowsiness. ?Do not  make important decisions or sign legal documents. ?Do not take care of children on your own.  ?  ?  ?Eating and drinking ?Follow the diet recommended by your health care provider. ?Drink enough fluid to keep your urine pale yellow. ?If you vomit: ?Drink water, juice, or soup when you can drink without vomiting. ?Make sure you have little or no nausea before eating solid foods.   ?General instructions ?Take over-the-counter and prescription medicines only as told by your health care provider. ?Have a responsible adult stay with you for the time you are told. It is important to have someone help care for you until you are awake and alert. ?Do not smoke. ?Keep all follow-up visits as told by your health care provider. This is important. ?Contact a health care provider if: ?You are still sleepy or having trouble with balance after 24 hours. ?You feel light-headed. ?You keep feeling nauseous or you keep vomiting. ?You develop a rash. ?You have a fever. ?You have redness or swelling around the IV site. ?Get help right away if: ?You have trouble breathing. ?You have new-onset confusion at home. ?Summary ?After the procedure, it is common to feel sleepy, have impaired judgment, or feel nauseous if you eat too soon. ?Rest after you get home. Know the things you should not do after the procedure. ?Follow the diet recommended by your health care provider and drink enough fluid to keep your urine pale yellow. ?Get help right away if you have trouble breathing or new-onset confusion at home. ?This information is not intended to replace advice given to you by your health care provider. Make sure you discuss any questions you have with your health care provider. ?Document Revised: 11/24/2019 Document Reviewed: 06/22/2019 ?Elsevier Patient Education ? 2021 Elsevier Inc.  ?

## 2023-04-05 NOTE — Procedures (Signed)
Interventional Radiology Procedure Note  Procedure: CT guided aspirate and core biopsy of right iliac bone  Complications: None  Recommendations: - Bedrest supine x 1 hrs - Hydrocodone PRN  Pain - Follow biopsy results   Dylan Suttle, MD   

## 2023-04-07 LAB — SURGICAL PATHOLOGY

## 2023-04-20 ENCOUNTER — Encounter (HOSPITAL_COMMUNITY): Payer: Self-pay

## 2023-05-25 DIAGNOSIS — H0288A Meibomian gland dysfunction right eye, upper and lower eyelids: Secondary | ICD-10-CM | POA: Diagnosis not present

## 2023-05-25 DIAGNOSIS — H40023 Open angle with borderline findings, high risk, bilateral: Secondary | ICD-10-CM | POA: Diagnosis not present

## 2023-05-25 DIAGNOSIS — H0288B Meibomian gland dysfunction left eye, upper and lower eyelids: Secondary | ICD-10-CM | POA: Diagnosis not present

## 2023-05-25 DIAGNOSIS — H25813 Combined forms of age-related cataract, bilateral: Secondary | ICD-10-CM | POA: Diagnosis not present

## 2023-06-03 NOTE — Progress Notes (Signed)
[facility] OFFICE PROGRESS NOTE  Daisy Floro, MD [address] 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 2) monoclonal gammopathy of undetermined significance.  PRIOR THERAPY: None  CURRENT THERAPY: Over-the-counter oral iron tablet with vitamin C.   INTERVAL HISTORY: Lance Rollins 77 y.o. male returns to the clinic today for a follow-up visit accompanied by his wife.  The patient was referred to the clinic and was seen in July 2024.  He had several lab studies at that time which showed M spike.  He was then seen for a follow-up in August to discuss next steps.  Dr. Arbutus Ped arranged for bone marrow biopsy and aspirate which he had performed on 04/05/2023.  He is here today to review the results and discuss next steps.  Otherwise he denies any major changes in his health since he was last seen. He reports his energy is "good".  He denies lightheadedness. Denies any visible bleeding such as epistaxis, gingival bleeding, hemoptysis, hematemesis, melena, or hematochezia.  The patient does not take any aspirin or ibuprofen. He is not on a blood thinner.  Denies any history of bariatric surgery.  He does not have any dietary restrictions such as being a vegan or vegetarian.  He does eat beef.  Denies any fever, chills, or unexplained weight loss. He denies any fractures or bone pain (except with feeling sore after the bone marrow biopsy).  He is here today for evaluation and to review his bone marrow biopsy results.    MEDICAL HISTORY: Past Medical History:  Diagnosis Date   Arthritis    Bleeding per rectum 08/2013   "black and bright red" (08/14/2013)   Hypercholesteremia    Hypertension    Obesity    Prostate cancer (HCC) 2011   Unilateral primary osteoarthritis, right hip 04/23/2017    ALLERGIES:  has No Known Allergies.  MEDICATIONS:  Current Outpatient Medications  Medication Sig Dispense Refill   cyclobenzaprine  (FLEXERIL) 10 MG tablet Take 1 tablet (10 mg total) by mouth 3 (three) times daily as needed for muscle spasms (hip tightness). 60 tablet 11   diclofenac (VOLTAREN) 75 MG EC tablet Take 1 tablet (75 mg total) by mouth daily as needed for mild pain. 30 tablet 8   ferrous sulfate 325 (65 FE) MG EC tablet Take 1 tablet (325 mg total) by mouth daily with breakfast. 30 tablet 0   irbesartan-hydrochlorothiazide (AVALIDE) 300-12.5 MG tablet Take 1 tablet by mouth daily.     Multiple Vitamin (MULTIVITAMIN WITH MINERALS) TABS tablet Take 1 tablet by mouth daily.     pantoprazole (PROTONIX) 40 MG tablet Take 1 tablet (40 mg total) by mouth daily. 30 tablet 0   simvastatin (ZOCOR) 40 MG tablet Take 40 mg by mouth daily.     No current facility-administered medications for this visit.    SURGICAL HISTORY:  Past Surgical History:  Procedure Laterality Date   BIOPSY  09/30/2022   Procedure: BIOPSY;  Surgeon: Tressia Danas, MD;  Location: [facility] ENDOSCOPY;  Service: Gastroenterology;;   COLONOSCOPY WITH PROPOFOL N/A 10/01/2022   Procedure: COLONOSCOPY WITH PROPOFOL;  Surgeon: Tressia Danas, MD;  Location: [facility] ENDOSCOPY;  Service: Gastroenterology;  Laterality: N/A;   ESOPHAGOGASTRODUODENOSCOPY N/A 08/15/2013   Procedure: ESOPHAGOGASTRODUODENOSCOPY (EGD);  Surgeon: Rachael Fee, MD;  Location: [facility] ENDOSCOPY;  Service: Endoscopy;  Laterality: N/A;   ESOPHAGOGASTRODUODENOSCOPY (EGD) WITH PROPOFOL N/A 09/30/2022   Procedure: ESOPHAGOGASTRODUODENOSCOPY (EGD) WITH PROPOFOL;  Surgeon: Tressia Danas, MD;  Location: [facility] ENDOSCOPY;  Service: Gastroenterology;  Laterality: N/A;   GIVENS CAPSULE STUDY  10/01/2022   Procedure: GIVENS CAPSULE STUDY;  Surgeon: Tressia Danas, MD;  Location: [facility] ENDOSCOPY;  Service: Gastroenterology;;   INGUINAL HERNIA REPAIR Bilateral ~ 2004   ROBOT ASSISTED LAPAROSCOPIC RADICAL PROSTATECTOMY  08/2009   Hattie Perch 08/14/2009 (08/14/2013)   TOTAL HIP ARTHROPLASTY Left 11/14/2013   Procedure:  LEFT TOTAL HIP ARTHROPLASTY ANTERIOR APPROACH;  Surgeon: Kathryne Hitch, MD;  Location: [facility] OR;  Service: Orthopedics;  Laterality: Left;   TOTAL HIP ARTHROPLASTY Right 04/23/2017   Procedure: RIGHT TOTAL HIP ARTHROPLASTY ANTERIOR APPROACH;  Surgeon: Kathryne Hitch, MD;  Location: [facility] ORS;  Service: Orthopedics;  Laterality: Right;    REVIEW OF SYSTEMS:   Review of Systems  Constitutional: Negative for appetite change, chills, fatigue, fever and unexpected weight change.  HENT: Negative for mouth sores, nosebleeds, sore throat and trouble swallowing.   Eyes: Negative for eye problems and icterus.  Respiratory: Negative for cough, hemoptysis, shortness of breath and wheezing.   Cardiovascular: Negative for chest pain and leg swelling.  Gastrointestinal: Negative for abdominal pain, constipation, diarrhea, nausea and vomiting.  Genitourinary: Negative for bladder incontinence, difficulty urinating, dysuria, frequency and hematuria.   Musculoskeletal: Negative for back pain, gait problem, neck pain and neck stiffness.  Skin: Negative for itching and rash.  Neurological: Negative for dizziness, extremity weakness, gait problem, headaches, light-headedness and seizures.  Hematological: Negative for adenopathy. Does not bruise/bleed easily.  Psychiatric/Behavioral: Negative for confusion, depression and sleep disturbance. The patient is not nervous/anxious.     PHYSICAL EXAMINATION:  Blood pressure (!) 140/50, pulse 81, temperature (!) 97.5 F (36.4 C), temperature source Oral, resp. rate 16, weight 230 lb 4.8 oz (104.5 kg), SpO2 99%.  ECOG PERFORMANCE STATUS: 0  Physical Exam  Constitutional: Oriented to person, place, and time and well-developed, well-nourished, and in no distress.  HENT:  Head: Normocephalic and atraumatic.  Mouth/Throat: Oropharynx is clear and moist. No oropharyngeal exudate.  Eyes: Conjunctivae are normal. Right eye exhibits no discharge. Left eye  exhibits no discharge. No scleral icterus.  Neck: Normal range of motion. Neck supple.  Cardiovascular: Normal rate, regular rhythm, Murmur noted (following with PCP for this) and intact distal pulses.   Pulmonary/Chest: Effort normal and breath sounds normal. No respiratory distress. No wheezes. No rales.  Abdominal: Soft. Bowel sounds are normal. Exhibits no distension and no mass. There is no tenderness.  Musculoskeletal: Normal range of motion. Exhibits no edema.  Lymphadenopathy:    No cervical adenopathy.  Neurological: Alert and oriented to person, place, and time. Exhibits normal muscle tone. Gait normal. Coordination normal. Ambulates with a cane. Skin: Skin is warm and dry. No rash noted. Not diaphoretic. No erythema. No pallor.  Psychiatric: Mood, memory and judgment normal.  Vitals reviewed.  LABORATORY DATA: Lab Results  Component Value Date   WBC 7.9 06/07/2023   HGB 12.4 (L) 06/07/2023   HCT 38.2 (L) 06/07/2023   MCV 85.7 06/07/2023   PLT 343 06/07/2023      Chemistry      Component Value Date/Time   NA 137 03/19/2023 1158   K 4.3 03/19/2023 1158   CL 101 03/19/2023 1158   CO2 32 03/19/2023 1158   BUN 22 03/19/2023 1158   CREATININE 1.06 03/19/2023 1158      Component Value Date/Time   CALCIUM 9.4 03/19/2023 1158   ALKPHOS 91 03/19/2023 1158   AST 20 03/19/2023 1158   ALT 25 03/19/2023 1158  BILITOT 0.6 03/19/2023 1158       RADIOGRAPHIC STUDIES:  No results found.   ASSESSMENT/PLAN:  This is a very pleasant 77 year old African-American male referred to the clinic for iron deficiency anemia.  He was found to have elevated M spike and free kappa light chain of 68.5   He recently had a bone marrow biopsy and aspirate performed.   He was seen with Dr. Arbutus Ped who personally and independently reviewed the results. The biopsy showed mildly increased plasma cells (5-8%).  Dr. Arbutus Ped explained he does not meet criteria for multiple myeloma but he has a  condition called MGUS and there is a 1% chance risk of this converting to multiple myeloma per year.  Therefore would be recommended close monitoring with repeat blood work every 6 months.  I will arrange for myeloma panel to be performed in 6 months.  We will see him for follow-up visit 1 week later to review the results.  He will continue his iron supplement at this time. His iron studies are pending. If any concerning findings, we will call him with further instruction.   The patient was advised to call immediately if he has any concerning symptoms in the interval. The patient voices understanding of current disease status and treatment options and is in agreement with the current care plan. All questions were answered. The patient knows to call the clinic with any problems, questions or concerns. We can certainly see the patient much sooner if necessary         Orders Placed This Encounter  Procedures   CBC with Differential (Cancer Center Only)    Standing Status:   Future    Standing Expiration Date:   06/06/2024   CMP (Cancer Center only)    Standing Status:   Future    Standing Expiration Date:   06/06/2024   Iron and Iron Binding Capacity ([facility] only)    Standing Status:   Future    Standing Expiration Date:   06/06/2024   Ferritin    Standing Status:   Future    Standing Expiration Date:   06/06/2024   Kappa/lambda light chains    Standing Status:   Future    Standing Expiration Date:   06/06/2024   Beta 2 microglobulin    Standing Status:   Future    Standing Expiration Date:   06/06/2024   QIG  (Quant. immunoglobulins  - IgG, IgA, IgM)    Standing Status:   Future    Standing Expiration Date:   06/06/2024      Johnette Abraham Teren Zurcher, PA-C 06/07/23  ADDENDUM: Hematology/Oncology Attending: I had a face-to-face encounter with the patient today.  I reviewed his record, lab, biopsy results and recommended his care plan.  This is a very pleasant 77 years  old African-American male who was during evaluation of iron deficiency anemia was found to have M spike suspicious for MGUS.  The patient had a bone marrow biopsy and aspirate performed recently.  I discussed the result with the patient and his wife.  His bone marrow biopsy and aspirate showed plasma cells in the range of 5-8% and no other concerning findings. I explained to the patient the nature of the MGUS and the potential progression to multiple myeloma in the range of 1% every year. I recommended for him to continue on observation with repeat myeloma panel in 6 months. Regarding the iron deficiency anemia he will continue on over-the-counter ferrous sulfate with vitamin C. The patient was  advised to call immediately if he has any other concerning symptoms in the interval. The total time spent in the appointment was 30 minutes. Disclaimer: This note was dictated with voice recognition software. Similar sounding words can inadvertently be transcribed and may be missed upon review. Lajuana Matte, MD

## 2023-06-04 ENCOUNTER — Telehealth: Payer: Self-pay | Admitting: Internal Medicine

## 2023-06-04 NOTE — Telephone Encounter (Signed)
Rescheduled 10/28 appointment time per providers request. Patient is notified of new time.

## 2023-06-07 ENCOUNTER — Ambulatory Visit: Payer: Medicare Other | Admitting: Physician Assistant

## 2023-06-07 ENCOUNTER — Inpatient Hospital Stay: Payer: Medicare Other | Admitting: Physician Assistant

## 2023-06-07 ENCOUNTER — Inpatient Hospital Stay: Payer: Medicare Other | Attending: Hematology and Oncology

## 2023-06-07 ENCOUNTER — Other Ambulatory Visit: Payer: Medicare Other

## 2023-06-07 VITALS — BP 140/50 | HR 81 | Temp 97.5°F | Resp 16 | Wt 230.3 lb

## 2023-06-07 DIAGNOSIS — D509 Iron deficiency anemia, unspecified: Secondary | ICD-10-CM | POA: Diagnosis not present

## 2023-06-07 DIAGNOSIS — D649 Anemia, unspecified: Secondary | ICD-10-CM

## 2023-06-07 DIAGNOSIS — D472 Monoclonal gammopathy: Secondary | ICD-10-CM | POA: Diagnosis not present

## 2023-06-07 LAB — CBC WITH DIFFERENTIAL (CANCER CENTER ONLY)
Abs Immature Granulocytes: 0.04 10*3/uL (ref 0.00–0.07)
Basophils Absolute: 0.1 10*3/uL (ref 0.0–0.1)
Basophils Relative: 1 %
Eosinophils Absolute: 0.3 10*3/uL (ref 0.0–0.5)
Eosinophils Relative: 3 %
HCT: 38.2 % — ABNORMAL LOW (ref 39.0–52.0)
Hemoglobin: 12.4 g/dL — ABNORMAL LOW (ref 13.0–17.0)
Immature Granulocytes: 1 %
Lymphocytes Relative: 29 %
Lymphs Abs: 2.3 10*3/uL (ref 0.7–4.0)
MCH: 27.8 pg (ref 26.0–34.0)
MCHC: 32.5 g/dL (ref 30.0–36.0)
MCV: 85.7 fL (ref 80.0–100.0)
Monocytes Absolute: 0.7 10*3/uL (ref 0.1–1.0)
Monocytes Relative: 8 %
Neutro Abs: 4.6 10*3/uL (ref 1.7–7.7)
Neutrophils Relative %: 58 %
Platelet Count: 343 10*3/uL (ref 150–400)
RBC: 4.46 MIL/uL (ref 4.22–5.81)
RDW: 14.4 % (ref 11.5–15.5)
WBC Count: 7.9 10*3/uL (ref 4.0–10.5)
nRBC: 0 % (ref 0.0–0.2)

## 2023-06-07 LAB — CMP (CANCER CENTER ONLY)
ALT: 21 U/L (ref 0–44)
AST: 19 U/L (ref 15–41)
Albumin: 3.7 g/dL (ref 3.5–5.0)
Alkaline Phosphatase: 89 U/L (ref 38–126)
Anion gap: 5 (ref 5–15)
BUN: 20 mg/dL (ref 8–23)
CO2: 32 mmol/L (ref 22–32)
Calcium: 9.5 mg/dL (ref 8.9–10.3)
Chloride: 102 mmol/L (ref 98–111)
Creatinine: 1.1 mg/dL (ref 0.61–1.24)
GFR, Estimated: >60 mL/min (ref >60)
Glucose, Bld: 94 mg/dL (ref 70–99)
Potassium: 4.6 mmol/L (ref 3.5–5.1)
Sodium: 139 mmol/L (ref 135–145)
Total Bilirubin: 0.4 mg/dL (ref 0.3–1.2)
Total Protein: 7.7 g/dL (ref 6.5–8.1)

## 2023-06-07 LAB — FERRITIN: Ferritin: 21 ng/mL — ABNORMAL LOW (ref 24–336)

## 2023-06-07 LAB — IRON AND IRON BINDING CAPACITY (CC-WL,HP ONLY)
Iron: 51 ug/dL (ref 45–182)
Saturation Ratios: 13 % — ABNORMAL LOW (ref 17.9–39.5)
TIBC: 396 ug/dL (ref 250–450)
UIBC: 345 ug/dL (ref 117–376)

## 2023-06-07 LAB — LACTATE DEHYDROGENASE: LDH: 173 U/L (ref 98–192)

## 2023-09-20 ENCOUNTER — Encounter
Payer: Medicare Other | Attending: Physical Medicine and Rehabilitation | Admitting: Physical Medicine and Rehabilitation

## 2023-09-20 ENCOUNTER — Encounter: Payer: Self-pay | Admitting: Physical Medicine and Rehabilitation

## 2023-09-20 VITALS — BP 148/83 | HR 87 | Ht 67.0 in | Wt 234.0 lb

## 2023-09-20 DIAGNOSIS — R600 Localized edema: Secondary | ICD-10-CM

## 2023-09-20 DIAGNOSIS — M7062 Trochanteric bursitis, left hip: Secondary | ICD-10-CM | POA: Diagnosis not present

## 2023-09-20 DIAGNOSIS — M7061 Trochanteric bursitis, right hip: Secondary | ICD-10-CM

## 2023-09-20 NOTE — Patient Instructions (Signed)
 Patient is a 78 yr old male with B/L trochanteric bursitis s/p injections on L. Here for f/u on trochanteric bursae.  Has lost 25 lbs since 2021-  Here for f/u on B/L trochanteric bursitis.    Will get him on wait list for trochanteric bursitis injection on B/L  hips- L hurts more.   2. Suggest speaking with PCP about a fluid pill for LE swelling- and do elevation as per #3- give him a call and see if can see him earlier than April.    3. Elevate legs to level of heart- 2 hours/day- 1 hour can be at night time. I use a body pillow under my feet- when I need to.    4.  F/U - will get on wait list asap for injections.

## 2023-09-20 NOTE — Progress Notes (Signed)
 Subjective:    Patient ID: Lance Rollins, male    DOB: 1945/10/13, 78 y.o.   MRN: [id number]  HPI  Patient is a 78 yr old male with B/L trochanteric bursitis s/p injections on L. Here for f/u on trochanteric bursae.  Has lost 25 lbs since 2021-  Here for f/u on B/L trochanteric bursitis.    R foot is still swelling-   Not hurting but swells- esp if on it too long.   Hasn't spoken to Dr Avanell Bob- PCP- (is retiring)-  next appt is last appt.   Wearing compression socks- not stockings.   Mother is 96 yrs ols- and is feisty. Won't sit down.    Pain Inventory Average Pain 0 Pain Right Now 0 My pain is  no pain today   In the last 24 hours, has pain interfered with the following? General activity 0 Relation with others 0 Enjoyment of life 0  Sleep (in general) Good   Family History  Problem Relation Age of Onset   Diabetes Sister    Diabetes Mother    Breast cancer Mother    Colon cancer Neg Hx    Stomach cancer Neg Hx    Esophageal cancer Neg Hx    Pancreatic cancer Neg Hx    Social History   Socioeconomic History   Marital status: Married    Spouse name: Not on file   Number of children: 5   Years of education: Not on file   Highest education level: Not on file  Occupational History   Occupation: retired  Tobacco Use   Smoking status: Never   Smokeless tobacco: Never  Vaping Use   Vaping status: Never Used  Substance and Sexual Activity   Alcohol use: Yes    Comment: 08/14/2013 "might have a drink q other holiday"   Drug use: No   Sexual activity: Not Currently  Other Topics Concern   Not on file  Social History Narrative   Not on file   Social Drivers of Health   Financial Resource Strain: Not on file  Food Insecurity: No Food Insecurity (09/30/2022)   Hunger Vital Sign    Worried About Running Out of Food in the Last Year: Never true    Ran Out of Food in the Last Year: Never true  Transportation Needs: No Transportation Needs (09/30/2022)   PRAPARE  - Administrator, Civil Service (Medical): No    Lack of Transportation (Non-Medical): No  Physical Activity: Not on file  Stress: Not on file  Social Connections: Not on file   Past Surgical History:  Procedure Laterality Date   BIOPSY  09/30/2022   Procedure: BIOPSY;  Surgeon: Lindle Rhea, MD;  Location: [facility] ENDOSCOPY;  Service: Gastroenterology;;   COLONOSCOPY WITH PROPOFOL  N/A 10/01/2022   Procedure: COLONOSCOPY WITH PROPOFOL ;  Surgeon: Lindle Rhea, MD;  Location: [facility] ENDOSCOPY;  Service: Gastroenterology;  Laterality: N/A;   ESOPHAGOGASTRODUODENOSCOPY N/A 08/15/2013   Procedure: ESOPHAGOGASTRODUODENOSCOPY (EGD);  Surgeon: Janel Medford, MD;  Location: [facility] ENDOSCOPY;  Service: Endoscopy;  Laterality: N/A;   ESOPHAGOGASTRODUODENOSCOPY (EGD) WITH PROPOFOL  N/A 09/30/2022   Procedure: ESOPHAGOGASTRODUODENOSCOPY (EGD) WITH PROPOFOL ;  Surgeon: Lindle Rhea, MD;  Location: [facility] ENDOSCOPY;  Service: Gastroenterology;  Laterality: N/A;   GIVENS CAPSULE STUDY  10/01/2022   Procedure: GIVENS CAPSULE STUDY;  Surgeon: Lindle Rhea, MD;  Location: [facility] ENDOSCOPY;  Service: Gastroenterology;;   INGUINAL HERNIA REPAIR Bilateral ~ 2004   ROBOT ASSISTED LAPAROSCOPIC RADICAL PROSTATECTOMY  08/2009   Maximo Spar  08/14/2009 (08/14/2013)   TOTAL HIP ARTHROPLASTY Left 11/14/2013   Procedure: LEFT TOTAL HIP ARTHROPLASTY ANTERIOR APPROACH;  Surgeon: Arnie Lao, MD;  Location: [facility] OR;  Service: Orthopedics;  Laterality: Left;   TOTAL HIP ARTHROPLASTY Right 04/23/2017   Procedure: RIGHT TOTAL HIP ARTHROPLASTY ANTERIOR APPROACH;  Surgeon: Arnie Lao, MD;  Location: [facility] ORS;  Service: Orthopedics;  Laterality: Right;   Past Surgical History:  Procedure Laterality Date   BIOPSY  09/30/2022   Procedure: BIOPSY;  Surgeon: Lindle Rhea, MD;  Location: [facility] ENDOSCOPY;  Service: Gastroenterology;;   COLONOSCOPY WITH PROPOFOL  N/A 10/01/2022   Procedure: COLONOSCOPY WITH PROPOFOL ;   Surgeon: Lindle Rhea, MD;  Location: [facility] ENDOSCOPY;  Service: Gastroenterology;  Laterality: N/A;   ESOPHAGOGASTRODUODENOSCOPY N/A 08/15/2013   Procedure: ESOPHAGOGASTRODUODENOSCOPY (EGD);  Surgeon: Janel Medford, MD;  Location: [facility] ENDOSCOPY;  Service: Endoscopy;  Laterality: N/A;   ESOPHAGOGASTRODUODENOSCOPY (EGD) WITH PROPOFOL  N/A 09/30/2022   Procedure: ESOPHAGOGASTRODUODENOSCOPY (EGD) WITH PROPOFOL ;  Surgeon: Lindle Rhea, MD;  Location: [facility] ENDOSCOPY;  Service: Gastroenterology;  Laterality: N/A;   GIVENS CAPSULE STUDY  10/01/2022   Procedure: GIVENS CAPSULE STUDY;  Surgeon: Lindle Rhea, MD;  Location: [facility] ENDOSCOPY;  Service: Gastroenterology;;   INGUINAL HERNIA REPAIR Bilateral ~ 2004   ROBOT ASSISTED LAPAROSCOPIC RADICAL PROSTATECTOMY  08/2009   Maximo Spar 08/14/2009 (08/14/2013)   TOTAL HIP ARTHROPLASTY Left 11/14/2013   Procedure: LEFT TOTAL HIP ARTHROPLASTY ANTERIOR APPROACH;  Surgeon: Arnie Lao, MD;  Location: [facility] OR;  Service: Orthopedics;  Laterality: Left;   TOTAL HIP ARTHROPLASTY Right 04/23/2017   Procedure: RIGHT TOTAL HIP ARTHROPLASTY ANTERIOR APPROACH;  Surgeon: Arnie Lao, MD;  Location: [facility] ORS;  Service: Orthopedics;  Laterality: Right;   Past Medical History:  Diagnosis Date   Arthritis    Bleeding per rectum 08/2013   "black and bright red" (08/14/2013)   Hypercholesteremia    Hypertension    Obesity    Prostate cancer (HCC) 2011   Unilateral primary osteoarthritis, right hip 04/23/2017   BP (!) 148/83   Pulse 87   Ht 5\' 7"  (1.702 m)   Wt 234 lb (106.1 kg)   SpO2 99%   BMI 36.65 kg/m   Opioid Risk Score:   Fall Risk Score:  `1  Depression screen PHQ 2/9     09/20/2023    2:19 PM 09/21/2022    2:02 PM 03/20/2022    2:46 PM 09/19/2021    2:36 PM 06/16/2021    1:59 PM 06/19/2019   10:06 AM  Depression screen PHQ 2/9  Decreased Interest 0 0 0 0 0 0  Down, Depressed, Hopeless 0 0  0 0 0  PHQ - 2 Score 0 0 0 0 0 0      Review of  Systems    An entire ROS was completed and found to be negative except for HPI Objective:   Physical Exam  Awake, alert, appropriate, NAD Looks healthy  Extremities:  LE' s1-2+ LE edema- To L ankle and R distal calf R trochanteric bursitis is negative/gone L side is TTP      Assessment & Plan:   Patient is a 78 yr old male with B/L trochanteric bursitis s/p injections on L. Here for f/u on trochanteric bursae.  Has lost 25 lbs since 2021-  Here for f/u on B/L trochanteric bursitis.    Will get him on wait list for trochanteric bursitis injection on B/L  hips- L hurts more.   2. Suggest speaking with PCP about  a fluid pill for LE swelling- and do elevation as per #3- give him a call and see if can see him earlier than April.    3. Elevate legs to level of heart- 2 hours/day- 1 hour can be at night time. I use a body pillow under my feet- when I need to.    4.  F/U - will get on wait list asap for injections.

## 2023-10-04 ENCOUNTER — Telehealth: Payer: Self-pay | Admitting: Physician Assistant

## 2023-10-04 NOTE — Telephone Encounter (Signed)
 Rescheduled appointment, spoke to the patient and he confirmed. Will be mailed an appointment reminder.

## 2023-10-08 ENCOUNTER — Telehealth: Payer: Self-pay | Admitting: Internal Medicine

## 2023-10-08 NOTE — Telephone Encounter (Signed)
 Rescheduled appointment per provider on call. Patient is aware of the changes made.Patient will be mailed an appointment reminder.

## 2023-10-11 ENCOUNTER — Encounter
Payer: Medicare Other | Attending: Physical Medicine and Rehabilitation | Admitting: Physical Medicine and Rehabilitation

## 2023-10-11 ENCOUNTER — Encounter: Payer: Self-pay | Admitting: Physical Medicine and Rehabilitation

## 2023-10-11 VITALS — BP 147/83 | HR 97 | Ht 67.0 in | Wt 237.3 lb

## 2023-10-11 DIAGNOSIS — M7061 Trochanteric bursitis, right hip: Secondary | ICD-10-CM | POA: Diagnosis not present

## 2023-10-11 DIAGNOSIS — M7062 Trochanteric bursitis, left hip: Secondary | ICD-10-CM | POA: Diagnosis not present

## 2023-10-11 MED ORDER — LIDOCAINE HCL 1 % IJ SOLN
2.0000 mL | Freq: Once | INTRAMUSCULAR | Status: AC
Start: 2023-10-11 — End: 2023-10-11
  Administered 2023-10-11: 2 mL

## 2023-10-11 MED ORDER — BETAMETHASONE SOD PHOS & ACET 6 (3-3) MG/ML IJ SUSP
12.0000 mg | Freq: Once | INTRAMUSCULAR | Status: AC
Start: 2023-10-11 — End: 2023-10-11
  Administered 2023-10-11: 12 mg via INTRA_ARTICULAR

## 2023-10-11 NOTE — Patient Instructions (Signed)
 Plan:  steroid injection was performed at  B/L trochanteric bursae injections  B/L using 1% plain Lidocaine and 40mg  /1cc of Kenalog. This was well tolerated.  Cleaned with betadine x3 and allowed to dry- then alcohol then injected using 27 gauge 1.5 inch needle- no bleeding or complications.    F/U in 3 months for steroid injections of B/L trochanteric bursa injections Lidocaine will kick in 15 minutes- and wear off tonight- the steroid will kick in tomorrow within 24 hours and take up to 72 hours to fully kick in.   2. F/U in 6 months-

## 2023-10-11 NOTE — Progress Notes (Signed)
 Patient is a 78 yr old male with B/L trochanteric bursitis s/p injections on L. Here for f/u on trochanteric bursae.  Has lost 25 lbs since 2021-  Here for f/u on B/L trochanteric bursitis.  Didn't need to call PCP- since elevating R leg during day 2 hours/day and at night and swelling went down dramatically.    Still having pain in lateral hips and wants trochanteric bursa injections.    Went to funeral of aunt yesterday 8 yr old aunt- last on mothers side except mother- will be 37 yrs old- still living.   Plan:  steroid injection was performed at  B/L trochanteric bursae injections  B/L using 1% plain Lidocaine and 1cc Celestone each side  This was well tolerated.  Cleaned with betadine x3 and allowed to dry- then alcohol then injected using 27 gauge 1.5 inch needle- no bleeding or complications.    F/U in 3 months for steroid injections of B/L trochanteric bursa injections Lidocaine will kick in 15 minutes- and wear off tonight- the steroid will kick in tomorrow within 24 hours and take up to 72 hours to fully kick in.   2. F/U in 6 months-

## 2023-10-22 ENCOUNTER — Other Ambulatory Visit: Payer: Self-pay | Admitting: Physical Medicine and Rehabilitation

## 2023-10-22 DIAGNOSIS — M7061 Trochanteric bursitis, right hip: Secondary | ICD-10-CM

## 2023-10-22 DIAGNOSIS — G894 Chronic pain syndrome: Secondary | ICD-10-CM

## 2023-12-07 ENCOUNTER — Other Ambulatory Visit: Payer: Self-pay | Admitting: Physical Medicine and Rehabilitation

## 2023-12-07 DIAGNOSIS — M7061 Trochanteric bursitis, right hip: Secondary | ICD-10-CM

## 2023-12-08 ENCOUNTER — Inpatient Hospital Stay: Payer: Medicare Other | Attending: Internal Medicine

## 2023-12-08 DIAGNOSIS — D472 Monoclonal gammopathy: Secondary | ICD-10-CM | POA: Insufficient documentation

## 2023-12-08 DIAGNOSIS — D509 Iron deficiency anemia, unspecified: Secondary | ICD-10-CM | POA: Diagnosis not present

## 2023-12-08 LAB — CBC WITH DIFFERENTIAL (CANCER CENTER ONLY)
Abs Immature Granulocytes: 0.03 10*3/uL (ref 0.00–0.07)
Basophils Absolute: 0.1 10*3/uL (ref 0.0–0.1)
Basophils Relative: 1 %
Eosinophils Absolute: 0.3 10*3/uL (ref 0.0–0.5)
Eosinophils Relative: 3 %
HCT: 38.6 % — ABNORMAL LOW (ref 39.0–52.0)
Hemoglobin: 12.8 g/dL — ABNORMAL LOW (ref 13.0–17.0)
Immature Granulocytes: 0 %
Lymphocytes Relative: 31 %
Lymphs Abs: 2.6 10*3/uL (ref 0.7–4.0)
MCH: 27.8 pg (ref 26.0–34.0)
MCHC: 33.2 g/dL (ref 30.0–36.0)
MCV: 83.7 fL (ref 80.0–100.0)
Monocytes Absolute: 0.8 10*3/uL (ref 0.1–1.0)
Monocytes Relative: 10 %
Neutro Abs: 4.7 10*3/uL (ref 1.7–7.7)
Neutrophils Relative %: 55 %
Platelet Count: 273 10*3/uL (ref 150–400)
RBC: 4.61 MIL/uL (ref 4.22–5.81)
RDW: 14.4 % (ref 11.5–15.5)
WBC Count: 8.6 10*3/uL (ref 4.0–10.5)
nRBC: 0 % (ref 0.0–0.2)

## 2023-12-08 LAB — IRON AND IRON BINDING CAPACITY (CC-WL,HP ONLY)
Iron: 83 ug/dL (ref 45–182)
Saturation Ratios: 20 % (ref 17.9–39.5)
TIBC: 410 ug/dL (ref 250–450)
UIBC: 327 ug/dL (ref 117–376)

## 2023-12-08 LAB — CMP (CANCER CENTER ONLY)
ALT: 18 U/L (ref 0–44)
AST: 17 U/L (ref 15–41)
Albumin: 3.8 g/dL (ref 3.5–5.0)
Alkaline Phosphatase: 89 U/L (ref 38–126)
Anion gap: 5 (ref 5–15)
BUN: 31 mg/dL — ABNORMAL HIGH (ref 8–23)
CO2: 28 mmol/L (ref 22–32)
Calcium: 9.3 mg/dL (ref 8.9–10.3)
Chloride: 104 mmol/L (ref 98–111)
Creatinine: 1.16 mg/dL (ref 0.61–1.24)
GFR, Estimated: >60 mL/min (ref >60)
Glucose, Bld: 122 mg/dL — ABNORMAL HIGH (ref 70–99)
Potassium: 4.1 mmol/L (ref 3.5–5.1)
Sodium: 137 mmol/L (ref 135–145)
Total Bilirubin: 0.8 mg/dL (ref 0.0–1.2)
Total Protein: 7.4 g/dL (ref 6.5–8.1)

## 2023-12-08 LAB — FERRITIN: Ferritin: 33 ng/mL (ref 24–336)

## 2023-12-09 LAB — KAPPA/LAMBDA LIGHT CHAINS
Kappa free light chain: 77.7 mg/L — ABNORMAL HIGH (ref 3.3–19.4)
Kappa, lambda light chain ratio: 4.83 — ABNORMAL HIGH (ref 0.26–1.65)
Lambda free light chains: 16.1 mg/L (ref 5.7–26.3)

## 2023-12-09 LAB — IGG, IGA, IGM
IgA: 267 mg/dL (ref 61–437)
IgG (Immunoglobin G), Serum: 1603 mg/dL (ref 603–1613)
IgM (Immunoglobulin M), Srm: 149 mg/dL — ABNORMAL HIGH (ref 15–143)

## 2023-12-09 LAB — BETA 2 MICROGLOBULIN, SERUM: Beta-2 Microglobulin: 1.8 mg/L (ref 0.6–2.4)

## 2023-12-10 ENCOUNTER — Other Ambulatory Visit: Payer: Self-pay | Admitting: Physical Medicine and Rehabilitation

## 2023-12-10 DIAGNOSIS — M7061 Trochanteric bursitis, right hip: Secondary | ICD-10-CM

## 2023-12-10 DIAGNOSIS — R609 Edema, unspecified: Secondary | ICD-10-CM | POA: Diagnosis not present

## 2023-12-10 DIAGNOSIS — M25571 Pain in right ankle and joints of right foot: Secondary | ICD-10-CM | POA: Diagnosis not present

## 2023-12-15 ENCOUNTER — Ambulatory Visit: Payer: Medicare Other | Admitting: Physician Assistant

## 2023-12-15 ENCOUNTER — Inpatient Hospital Stay: Payer: Medicare Other | Attending: Internal Medicine | Admitting: Internal Medicine

## 2023-12-15 VITALS — BP 130/59 | HR 88 | Temp 98.0°F | Resp 17 | Ht 67.0 in | Wt 243.6 lb

## 2023-12-15 DIAGNOSIS — D472 Monoclonal gammopathy: Secondary | ICD-10-CM | POA: Diagnosis not present

## 2023-12-15 DIAGNOSIS — K2970 Gastritis, unspecified, without bleeding: Secondary | ICD-10-CM | POA: Insufficient documentation

## 2023-12-15 DIAGNOSIS — D508 Other iron deficiency anemias: Secondary | ICD-10-CM | POA: Insufficient documentation

## 2023-12-15 NOTE — Progress Notes (Signed)
 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 2) monoclonal gammopathy of undetermined significance.  PRIOR THERAPY: None  CURRENT THERAPY: Over-the-counter oral iron tablet with vitamin C.  INTERVAL HISTORY: Lance Rollins 78 y.o. male returns to the clinic today for follow-up visit accompanied by his wife.Discussed the use of AI scribe software for clinical note transcription with the patient, who gave verbal consent to proceed.  History of Present Illness   Lance Rollins is a 78 year old male with iron deficiency anemia secondary to gastritis and MGUS who presents for evaluation with repeat myeloma panel. He is accompanied by his wife.  He is currently on observation for MGUS and takes over-the-counter oral iron supplements with vitamin C. His anemia has shown improvement, with a recent hemoglobin level of 12.8, up from 12.2. Iron studies and ferritin levels are stable.  He experiences intermittent pain at the site of a previous bone marrow biopsy, performed in August. The pain at the tailbone 'comes and goes'.  His wife mentions he has been experiencing pain in his left hip and foot. He has not yet seen an orthopedic surgeon but has an upcoming physical with his primary care doctor.  No chest pain, shortness of breath, or cough.         MEDICAL HISTORY: Past Medical History:  Diagnosis Date   Arthritis    Bleeding per rectum 08/2013   "black and bright red" (08/14/2013)   Hypercholesteremia    Hypertension    Obesity    Prostate cancer (HCC) 2011   Unilateral primary osteoarthritis, right hip 04/23/2017    ALLERGIES:  has no known allergies.  MEDICATIONS:  Current Outpatient Medications  Medication Sig Dispense Refill   cyclobenzaprine  (FLEXERIL ) 10 MG tablet TAKE 1 TABLET BY MOUTH THREE  TIMES DAILY AS NEEDED FOR MUSCLE SPASMS (HIP TIGHTNESS) 60 tablet 5   diclofenac  (VOLTAREN ) 75 MG EC tablet TAKE 1 TABLET BY MOUTH ONCE DAILY AS NEEDED FOR MILD PAIN 30 tablet 5   ferrous sulfate  325 (65 FE) MG EC tablet Take 1 tablet (325 mg total) by mouth daily with breakfast. 30 tablet 0   irbesartan-hydrochlorothiazide (AVALIDE) 300-12.5 MG tablet Take 1 tablet by mouth daily.     Multiple Vitamin (MULTIVITAMIN WITH MINERALS) TABS tablet Take 1 tablet by mouth daily.     pantoprazole  (PROTONIX ) 40 MG tablet Take 1 tablet (40 mg total) by mouth daily. 30 tablet 0   simvastatin  (ZOCOR ) 40 MG tablet Take 40 mg by mouth daily.     No current facility-administered medications for this visit.    SURGICAL HISTORY:  Past Surgical History:  Procedure Laterality Date   BIOPSY  09/30/2022   Procedure: BIOPSY;  Surgeon: Lindle Rhea, MD;  Location: [facility] ENDOSCOPY;  Service: Gastroenterology;;   COLONOSCOPY WITH PROPOFOL  N/A 10/01/2022   Procedure: COLONOSCOPY WITH PROPOFOL ;  Surgeon: Lindle Rhea, MD;  Location: [facility] ENDOSCOPY;  Service: Gastroenterology;  Laterality: N/A;   ESOPHAGOGASTRODUODENOSCOPY N/A 08/15/2013   Procedure: ESOPHAGOGASTRODUODENOSCOPY (EGD);  Surgeon: Janel Medford, MD;  Location: [facility] ENDOSCOPY;  Service: Endoscopy;  Laterality: N/A;   ESOPHAGOGASTRODUODENOSCOPY (EGD) WITH PROPOFOL  N/A 09/30/2022   Procedure: ESOPHAGOGASTRODUODENOSCOPY (EGD) WITH PROPOFOL ;  Surgeon: Lindle Rhea, MD;  Location: [facility] ENDOSCOPY;  Service: Gastroenterology;  Laterality: N/A;   GIVENS CAPSULE STUDY  10/01/2022   Procedure: GIVENS CAPSULE  STUDY;  Surgeon: Lindle Rhea, MD;  Location: [facility] ENDOSCOPY;  Service: Gastroenterology;;   INGUINAL HERNIA REPAIR Bilateral ~ 2004   ROBOT ASSISTED LAPAROSCOPIC RADICAL PROSTATECTOMY  08/2009   Maximo Spar 08/14/2009 (08/14/2013)   TOTAL HIP ARTHROPLASTY Left 11/14/2013   Procedure: LEFT TOTAL HIP ARTHROPLASTY ANTERIOR APPROACH;  Surgeon: Arnie Lao,  MD;  Location: [facility] OR;  Service: Orthopedics;  Laterality: Left;   TOTAL HIP ARTHROPLASTY Right 04/23/2017   Procedure: RIGHT TOTAL HIP ARTHROPLASTY ANTERIOR APPROACH;  Surgeon: Arnie Lao, MD;  Location: [facility] ORS;  Service: Orthopedics;  Laterality: Right;    REVIEW OF SYSTEMS:  Constitutional: negative Eyes: negative Ears, nose, mouth, throat, and face: negative Respiratory: negative Cardiovascular: negative Gastrointestinal: negative Genitourinary:negative Integument/breast: negative Hematologic/lymphatic: negative Musculoskeletal:positive for arthralgias Neurological: negative Behavioral/Psych: negative Endocrine: negative Allergic/Immunologic: negative   PHYSICAL EXAMINATION: General appearance: alert, cooperative, and no distress Head: Normocephalic, without obvious abnormality, atraumatic Neck: no adenopathy, no JVD, supple, symmetrical, trachea midline, and thyroid not enlarged, symmetric, no tenderness/mass/nodules Lymph nodes: Cervical, supraclavicular, and axillary nodes normal. Resp: clear to auscultation bilaterally Back: symmetric, no curvature. ROM normal. No CVA tenderness. Cardio: regular rate and rhythm, S1, S2 normal, no murmur, click, rub or gallop GI: soft, non-tender; bowel sounds normal; no masses,  no organomegaly Extremities: extremities normal, atraumatic, no cyanosis or edema Neurologic: Alert and oriented X 3, normal strength and tone. Normal symmetric reflexes. Normal coordination and gait  ECOG PERFORMANCE STATUS: 0 - Asymptomatic  Blood pressure (!) 130/59, pulse 88, temperature 98 F (36.7 C), temperature source Temporal, resp. rate 17, height 5\' 7"  (1.702 m), weight 243 lb 9.6 oz (110.5 kg), SpO2 98%.  LABORATORY DATA: Lab Results  Component Value Date   WBC 8.6 12/08/2023   HGB 12.8 (L) 12/08/2023   HCT 38.6 (L) 12/08/2023   MCV 83.7 12/08/2023   PLT 273 12/08/2023      Chemistry      Component Value Date/Time   NA 137  12/08/2023 1413   K 4.1 12/08/2023 1413   CL 104 12/08/2023 1413   CO2 28 12/08/2023 1413   BUN 31 (H) 12/08/2023 1413   CREATININE 1.16 12/08/2023 1413      Component Value Date/Time   CALCIUM 9.3 12/08/2023 1413   ALKPHOS 89 12/08/2023 1413   AST 17 12/08/2023 1413   ALT 18 12/08/2023 1413   BILITOT 0.8 12/08/2023 1413       RADIOGRAPHIC STUDIES: No results found.  ASSESSMENT AND PLAN: This is a very pleasant 78 years old African-American male who was evaluated for iron deficiency anemia secondary to gastritis and incidentally he was found to have elevated M spike with free kappa light chain of 68.5%.  Bone marrow biopsy and aspirate performed in March 12, 2023 showed hypercellular marrow with 5-8% plasma cells.     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 IgG levels are normal, and IgM is slightly elevated but not significantly. Free kappa light chain levels are slightly increased but within acceptable limits. No new symptoms or complications reported. - Continue observation every six months - Repeat myeloma panel before next visit  Iron deficiency anemia Iron deficiency anemia shows improvement with current treatment. Hemoglobin level has increased from 12.2 to 12.8, indicating a positive response to oral iron supplementation. - Continue oral iron supplements with vitamin C - Repeat iron study before next visit    For the anemia, the patient will continue on his oral iron tablet  for now. The patient was advised to call immediately if he has any concerning symptoms in the interval.  The patient voices understanding of current disease status and treatment options and is in agreement with the current care plan.  All questions were answered. The patient knows to call the clinic with any problems, questions or concerns. We can certainly see the patient much sooner if necessary.  The total time  spent in the appointment was 30 minutes.  Disclaimer: This note was dictated with voice recognition software. Similar sounding words can inadvertently be transcribed and may not be corrected upon review.

## 2023-12-28 DIAGNOSIS — R790 Abnormal level of blood mineral: Secondary | ICD-10-CM | POA: Diagnosis not present

## 2023-12-28 DIAGNOSIS — R011 Cardiac murmur, unspecified: Secondary | ICD-10-CM | POA: Diagnosis not present

## 2023-12-28 DIAGNOSIS — Z8719 Personal history of other diseases of the digestive system: Secondary | ICD-10-CM | POA: Diagnosis not present

## 2023-12-28 DIAGNOSIS — Z0000 Encounter for general adult medical examination without abnormal findings: Secondary | ICD-10-CM | POA: Diagnosis not present

## 2023-12-28 DIAGNOSIS — M25571 Pain in right ankle and joints of right foot: Secondary | ICD-10-CM | POA: Diagnosis not present

## 2023-12-28 DIAGNOSIS — I10 Essential (primary) hypertension: Secondary | ICD-10-CM | POA: Diagnosis not present

## 2023-12-28 DIAGNOSIS — E7800 Pure hypercholesterolemia, unspecified: Secondary | ICD-10-CM | POA: Diagnosis not present

## 2024-01-11 ENCOUNTER — Ambulatory Visit: Admitting: Podiatry

## 2024-01-18 ENCOUNTER — Ambulatory Visit (INDEPENDENT_AMBULATORY_CARE_PROVIDER_SITE_OTHER)

## 2024-01-18 ENCOUNTER — Encounter: Payer: Self-pay | Admitting: Podiatry

## 2024-01-18 ENCOUNTER — Ambulatory Visit: Admitting: Podiatry

## 2024-01-18 ENCOUNTER — Other Ambulatory Visit: Payer: Self-pay | Admitting: Podiatry

## 2024-01-18 DIAGNOSIS — Z79899 Other long term (current) drug therapy: Secondary | ICD-10-CM | POA: Insufficient documentation

## 2024-01-18 DIAGNOSIS — I10 Essential (primary) hypertension: Secondary | ICD-10-CM | POA: Insufficient documentation

## 2024-01-18 DIAGNOSIS — Z8546 Personal history of malignant neoplasm of prostate: Secondary | ICD-10-CM | POA: Insufficient documentation

## 2024-01-18 DIAGNOSIS — N529 Male erectile dysfunction, unspecified: Secondary | ICD-10-CM | POA: Insufficient documentation

## 2024-01-18 DIAGNOSIS — M7750 Other enthesopathy of unspecified foot: Secondary | ICD-10-CM

## 2024-01-18 DIAGNOSIS — M76821 Posterior tibial tendinitis, right leg: Secondary | ICD-10-CM

## 2024-01-18 DIAGNOSIS — E669 Obesity, unspecified: Secondary | ICD-10-CM | POA: Insufficient documentation

## 2024-01-18 DIAGNOSIS — Q666 Other congenital valgus deformities of feet: Secondary | ICD-10-CM | POA: Diagnosis not present

## 2024-01-18 DIAGNOSIS — M7751 Other enthesopathy of right foot: Secondary | ICD-10-CM | POA: Diagnosis not present

## 2024-01-18 DIAGNOSIS — M19071 Primary osteoarthritis, right ankle and foot: Secondary | ICD-10-CM | POA: Diagnosis not present

## 2024-01-18 DIAGNOSIS — E7800 Pure hypercholesterolemia, unspecified: Secondary | ICD-10-CM | POA: Insufficient documentation

## 2024-01-18 MED ORDER — TRIAMCINOLONE ACETONIDE 40 MG/ML IJ SUSP
20.0000 mg | Freq: Once | INTRAMUSCULAR | Status: AC
  Administered 2024-01-18: 20 mg

## 2024-01-18 NOTE — Progress Notes (Signed)
 Subjective:  Patient ID: Lance Rollins, male    DOB: Apr 26, 1946,  MRN: [id number] HPI Chief Complaint  Patient presents with   Ankle Pain    Medial ankle right - hip replacement in 2018-19 and has noticed foot and ankle has been increasingly hurting and swelling since, aches at night, lateral ankle starts to hurt after standing and walking for long periods   New Patient (Initial Visit)    78 y.o. male presents with the above complaint.   ROS: Denies fever chills nausea mobic  muscle aches pains calf pain back pain chest pain shortness of breath.  Past Medical History:  Diagnosis Date   Arthritis    Bleeding per rectum 08/2013   "black and bright red" (08/14/2013)   Hypercholesteremia    Hypertension    Obesity    Prostate cancer (HCC) 2011   Unilateral primary osteoarthritis, right hip 04/23/2017   Past Surgical History:  Procedure Laterality Date   BIOPSY  09/30/2022   Procedure: BIOPSY;  Surgeon: Lindle Rhea, MD;  Location: [facility] ENDOSCOPY;  Service: Gastroenterology;;   COLONOSCOPY WITH PROPOFOL  N/A 10/01/2022   Procedure: COLONOSCOPY WITH PROPOFOL ;  Surgeon: Lindle Rhea, MD;  Location: [facility] ENDOSCOPY;  Service: Gastroenterology;  Laterality: N/A;   ESOPHAGOGASTRODUODENOSCOPY N/A 08/15/2013   Procedure: ESOPHAGOGASTRODUODENOSCOPY (EGD);  Surgeon: Janel Medford, MD;  Location: [facility] ENDOSCOPY;  Service: Endoscopy;  Laterality: N/A;   ESOPHAGOGASTRODUODENOSCOPY (EGD) WITH PROPOFOL  N/A 09/30/2022   Procedure: ESOPHAGOGASTRODUODENOSCOPY (EGD) WITH PROPOFOL ;  Surgeon: Lindle Rhea, MD;  Location: [facility] ENDOSCOPY;  Service: Gastroenterology;  Laterality: N/A;   GIVENS CAPSULE STUDY  10/01/2022   Procedure: GIVENS CAPSULE STUDY;  Surgeon: Lindle Rhea, MD;  Location: [facility] ENDOSCOPY;  Service: Gastroenterology;;   INGUINAL HERNIA REPAIR Bilateral ~ 2004   ROBOT ASSISTED LAPAROSCOPIC RADICAL PROSTATECTOMY  08/2009   Maximo Spar 08/14/2009 (08/14/2013)   TOTAL HIP ARTHROPLASTY Left 11/14/2013    Procedure: LEFT TOTAL HIP ARTHROPLASTY ANTERIOR APPROACH;  Surgeon: Arnie Lao, MD;  Location: [facility] OR;  Service: Orthopedics;  Laterality: Left;   TOTAL HIP ARTHROPLASTY Right 04/23/2017   Procedure: RIGHT TOTAL HIP ARTHROPLASTY ANTERIOR APPROACH;  Surgeon: Arnie Lao, MD;  Location: [facility] ORS;  Service: Orthopedics;  Laterality: Right;    Current Outpatient Medications:    cyclobenzaprine  (FLEXERIL ) 10 MG tablet, TAKE 1 TABLET BY MOUTH THREE TIMES DAILY AS NEEDED FOR MUSCLE SPASMS (HIP TIGHTNESS), Disp: 60 tablet, Rfl: 5   diclofenac  (VOLTAREN ) 75 MG EC tablet, TAKE 1 TABLET BY MOUTH ONCE DAILY AS NEEDED FOR MILD PAIN, Disp: 30 tablet, Rfl: 5   ferrous sulfate  325 (65 FE) MG EC tablet, Take 1 tablet (325 mg total) by mouth daily with breakfast., Disp: 30 tablet, Rfl: 0   irbesartan-hydrochlorothiazide (AVALIDE) 300-12.5 MG tablet, Take 1 tablet by mouth daily., Disp: , Rfl:    Multiple Vitamins-Minerals (MULTIVITAMIN ADULT, MINERALS,) TABS, 1 tablet Orally daily, Disp: , Rfl:    pantoprazole  (PROTONIX ) 40 MG tablet, Take 1 tablet (40 mg total) by mouth daily., Disp: 30 tablet, Rfl: 0   simvastatin  (ZOCOR ) 40 MG tablet, Take 40 mg by mouth daily., Disp: , Rfl:   No Known Allergies Review of Systems Objective:  There were no vitals filed for this visit.  General: Well developed, nourished, in no acute distress, alert and oriented x3   Dermatological: Skin is warm, dry and supple bilateral. Nails x 10 are well maintained; remaining integument appears unremarkable at this time. There are no open sores, no preulcerative lesions, no rash or signs of  infection present.  Vascular: Dorsalis Pedis artery and Posterior Tibial artery pedal pulses are 2/4 bilateral with immedate capillary fill time. Pedal hair growth present. No varicosities and no lower extremity edema present bilateral.   Neruologic: Grossly intact via light touch bilateral. Vibratory intact via tuning fork  bilateral. Protective threshold with Semmes Wienstein monofilament intact to all pedal sites bilateral. Patellar and Achilles deep tendon reflexes 2+ bilateral. No Babinski or clonus noted bilateral.   Musculoskeletal: No gross boney pedal deformities bilateral. No pain, crepitus, or limitation noted with foot and ankle range of motion bilateral. Muscular strength 5/5 in all groups tested bilateral.  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  Pain on palpation of posterior tibial tendon.  Gait: Unassisted, Nonantalgic.    Radiographs:  Radiographs taken of the right foot and ankle today demonstrate osseously mature individual with good bone mineralization however there is hardly a joint that does not demonstrate osteoarthritic change.  Severe osteoarthritis pes planovalgus  Assessment & Plan:   Assessment: Osteoarthritis insertional posterior tibial tendinitis.  Plan: I injected the right foot today at the plantar aspect of the navicular tuberosity with 10 mg Kenalog  5 mg Marcaine .     Lance Rollins, [place]

## 2024-02-01 DIAGNOSIS — K921 Melena: Secondary | ICD-10-CM | POA: Diagnosis not present

## 2024-02-01 DIAGNOSIS — Z8719 Personal history of other diseases of the digestive system: Secondary | ICD-10-CM | POA: Diagnosis not present

## 2024-02-08 DIAGNOSIS — K921 Melena: Secondary | ICD-10-CM | POA: Diagnosis not present

## 2024-03-14 ENCOUNTER — Ambulatory Visit: Admitting: Podiatry

## 2024-03-16 ENCOUNTER — Encounter: Payer: Self-pay | Admitting: Gastroenterology

## 2024-03-16 ENCOUNTER — Other Ambulatory Visit

## 2024-03-16 ENCOUNTER — Ambulatory Visit: Admitting: Gastroenterology

## 2024-03-16 VITALS — BP 142/70 | HR 82 | Ht 67.0 in | Wt 242.6 lb

## 2024-03-16 DIAGNOSIS — K921 Melena: Secondary | ICD-10-CM

## 2024-03-16 DIAGNOSIS — D509 Iron deficiency anemia, unspecified: Secondary | ICD-10-CM | POA: Diagnosis not present

## 2024-03-16 DIAGNOSIS — K2961 Other gastritis with bleeding: Secondary | ICD-10-CM

## 2024-03-16 LAB — CBC WITH DIFFERENTIAL/PLATELET
Basophils Absolute: 0.1 K/uL (ref 0.0–0.1)
Basophils Relative: 0.7 % (ref 0.0–3.0)
Eosinophils Absolute: 0.3 K/uL (ref 0.0–0.7)
Eosinophils Relative: 4.3 % (ref 0.0–5.0)
HCT: 27.6 % — ABNORMAL LOW (ref 39.0–52.0)
Hemoglobin: 8.8 g/dL — ABNORMAL LOW (ref 13.0–17.0)
Lymphocytes Relative: 27.1 % (ref 12.0–46.0)
Lymphs Abs: 2 K/uL (ref 0.7–4.0)
MCHC: 31.8 g/dL (ref 30.0–36.0)
MCV: 77.1 fl — ABNORMAL LOW (ref 78.0–100.0)
Monocytes Absolute: 0.8 K/uL (ref 0.1–1.0)
Monocytes Relative: 11.7 % (ref 3.0–12.0)
Neutro Abs: 4 K/uL (ref 1.4–7.7)
Neutrophils Relative %: 56.2 % (ref 43.0–77.0)
Platelets: 422 K/uL — ABNORMAL HIGH (ref 150.0–400.0)
RBC: 3.58 Mil/uL — ABNORMAL LOW (ref 4.22–5.81)
RDW: 16.6 % — ABNORMAL HIGH (ref 11.5–15.5)
WBC: 7.2 K/uL (ref 4.0–10.5)

## 2024-03-16 MED ORDER — PANTOPRAZOLE SODIUM 40 MG PO TBEC: 40.0000 mg | DELAYED_RELEASE_TABLET | Freq: Two times a day (BID) | ORAL | 1 refills | Status: AC

## 2024-03-16 NOTE — Patient Instructions (Addendum)
-  No NSAID's -Increase Pantoprazole  40 mg po daily to twice daily for 8 weeks   Advised to go to the ER if there is any severe weakness, severe abdominal pain, vomit blood, dark red blood in your bowel movement, shortness of breath or chest pain.    Your provider has requested that you go to the basement level for lab work before leaving today. Press B on the elevator. The lab is located at the first door on the left as you exit the elevator.  _______________________________________________________  If your blood pressure at your visit was 140/90 or greater, please contact your primary care physician to follow up on this.  _______________________________________________________  If you are age 19 or older, your body mass index should be between 23-30. Your Body mass index is 38 kg/m. If this is out of the aforementioned range listed, please consider follow up with your Primary Care Provider.  If you are age 23 or younger, your body mass index should be between 19-25. Your Body mass index is 38 kg/m. If this is out of the aformentioned range listed, please consider follow up with your Primary Care Provider.   ________________________________________________________  The [facility] providers would like to encourage you to use MYCHART to communicate with providers for non-urgent requests or questions.  Due to long hold times on the telephone, sending your provider a message by [facility] may be a faster and more efficient way to get a response.  Please allow 48 business hours for a response.  Please remember that this is for non-urgent requests.  _______________________________________________________  [facility] is using a team-based approach to care.  Your team is made up of your doctor and two to three APPS. Our APPS (Nurse Practitioners and Physician Assistants) work with your physician to ensure care continuity for you. They are fully qualified to address your health concerns  and develop a treatment plan. They communicate directly with your gastroenterologist to care for you. Seeing the Advanced Practice Practitioners on your physician's team can help you by facilitating care more promptly, often allowing for earlier appointments, access to diagnostic testing, procedures, and other specialty referrals.   Thank you for trusting me with your gastrointestinal care. Deanna May, FNP-C

## 2024-03-16 NOTE — Progress Notes (Signed)
 Chief Complaint:black stools, Hgb dropping  Primary GI Doctor:(Dr. Eda) Dr. Federico   HPI: Lance Rollins is a 78 year old African-American male with a past medical history as listed below including hypertension, obesity and chronic pain secondary to bilateral hip bursitis/trochanteric bursitis and history of prostatic adenocarcinoma status post radical prostatectomy in 2011, who presents to clinic today for follow-up black stools, Hgb dropping     09/30/2022 patient consulted by our service in the hospital for symptomatic anemia.  At that time hemoglobin down to 7.5 (from baseline of 12.9 in 2021), had some palpitations/indigestion and episode of syncope.  Had a recent colonoscopy in 2021 and recommendations for EGD were made.     09/30/2022 EGD with erythematous mucosa in the antrum and otherwise normal.  Patient reduced to Pantoprazole  40 mg daily for 8 weeks.  Recommended a colonoscopy.  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  Patient was started on quadruple therapy.  Repeat H. pylori stool testing recommended 2 weeks after treatment.    10/01/2022 colonoscopy with diverticulosis and otherwise normal.  A capsule endoscopy was placed.    10/01/22 capsule endoscopy with gastric erosions noted likely from gastric biopsy site and otherwise negative.  Patient was continued on oral iron therapy and told to repeat iron studies in 3 to 4 months.    10/02/2022 CBC with a hemoglobin of 8.2.  MCV low at 69.4.    11/25/2022 appointment in GI office with Lance Rollins, Lance Rollins for follow-up on anemia.  Patient completed H. pylori treatment.  H. pylori stool antigen test ordered and negative.  Lab work collected to reevaluate anemia and iron deficiency.  Labs stable and patient was continued on iron supplementation.    03/01/2023 [name], PA recheck patient's lab work which revealed iron deficiency despite being on iron supplementation.  Referral was placed to hematology. 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  Bone marrow biopsy was recommended to rule out multiple myeloma. 05/2023 Dr. Sherrod explained he does not meet criteria for multiple myeloma but he has a condition called MGUS and there is a 1% chance risk of this converting to multiple myeloma per year.  12/2023  appt with hematology -He is currently on observation for MGUS and takes over-the-counter oral iron supplements with vitamin C. His anemia has shown improvement, with a recent hemoglobin level of 12.8, up from 12.2. Iron studies and ferritin levels are stable.    Interval History    Patient presents for evaluation of black stools and Hgb dropping.  Patient reports taking ferrous sulfate  1 tablet po daily.  Patient is being closely monitored by hematology.  Patient reports episode of dark stools.  He reports after eating half a pack of walnuts 4-6 weeks ago he noted dark runny stools for about a week then resolved on own. No black stools since. No associated nausea, vomiting or abdominal. Appetite is good. No changes to medications. Patient still taking Pantoprazole  40 mg po daily. No blood thinners.  Denies taking NSAIDs. No fatigue, chest pain, sob.   Nonsmoker. No alcohol use.  No family history of GI issues or CA.   Wt Readings from Last 3 Encounters:  03/16/24 242 lb 9.6 oz (110 kg)  12/15/23 243 lb 9.6 oz (110.5 kg)  10/11/23 237 lb 4.8 oz (107.6 kg)    Past Medical History:  Diagnosis Date   Arthritis  Bleeding per rectum 08/2013   black and bright red (08/14/2013)   Hypercholesteremia    Hypertension    Obesity    Prostate cancer (HCC) 2011   Unilateral primary osteoarthritis, right hip 04/23/2017    Past Surgical History:  Procedure Laterality Date   BIOPSY  09/30/2022   Procedure: BIOPSY;  Surgeon: Eda Iha, MD;  Location: [facility] ENDOSCOPY;  Service:  Gastroenterology;;   COLONOSCOPY WITH PROPOFOL  N/A 10/01/2022   Procedure: COLONOSCOPY WITH PROPOFOL ;  Surgeon: Eda Iha, MD;  Location: [facility] ENDOSCOPY;  Service: Gastroenterology;  Laterality: N/A;   ESOPHAGOGASTRODUODENOSCOPY N/A 08/15/2013   Procedure: ESOPHAGOGASTRODUODENOSCOPY (EGD);  Surgeon: Toribio SHAUNNA Cedar, MD;  Location: [facility] ENDOSCOPY;  Service: Endoscopy;  Laterality: N/A;   ESOPHAGOGASTRODUODENOSCOPY (EGD) WITH PROPOFOL  N/A 09/30/2022   Procedure: ESOPHAGOGASTRODUODENOSCOPY (EGD) WITH PROPOFOL ;  Surgeon: Eda Iha, MD;  Location: [facility] ENDOSCOPY;  Service: Gastroenterology;  Laterality: N/A;   GIVENS CAPSULE STUDY  10/01/2022   Procedure: GIVENS CAPSULE STUDY;  Surgeon: Eda Iha, MD;  Location: [facility] ENDOSCOPY;  Service: Gastroenterology;;   INGUINAL HERNIA REPAIR Bilateral ~ 2004   ROBOT ASSISTED LAPAROSCOPIC RADICAL PROSTATECTOMY  08/2009   thelbert 08/14/2009 (08/14/2013)   TOTAL HIP ARTHROPLASTY Left 11/14/2013   Procedure: LEFT TOTAL HIP ARTHROPLASTY ANTERIOR APPROACH;  Surgeon: Lonni CINDERELLA Poli, MD;  Location: [facility] OR;  Service: Orthopedics;  Laterality: Left;   TOTAL HIP ARTHROPLASTY Right 04/23/2017   Procedure: RIGHT TOTAL HIP ARTHROPLASTY ANTERIOR APPROACH;  Surgeon: Poli Lonni CINDERELLA, MD;  Location: [facility] ORS;  Service: Orthopedics;  Laterality: Right;    Current Outpatient Medications  Medication Sig Dispense Refill   cyclobenzaprine  (FLEXERIL ) 10 MG tablet TAKE 1 TABLET BY MOUTH THREE TIMES DAILY AS NEEDED FOR MUSCLE SPASMS (HIP TIGHTNESS) 60 tablet 5   diclofenac  (VOLTAREN ) 75 MG EC tablet TAKE 1 TABLET BY MOUTH ONCE DAILY AS NEEDED FOR MILD PAIN 30 tablet 5   ferrous sulfate  325 (65 FE) MG EC tablet Take 1 tablet (325 mg total) by mouth daily with breakfast. 30 tablet 0   irbesartan-hydrochlorothiazide (AVALIDE) 300-12.5 MG tablet Take 1 tablet by mouth daily.     Multiple Vitamins-Minerals (MULTIVITAMIN ADULT, MINERALS,) TABS 1 tablet Orally daily      pantoprazole  (PROTONIX ) 40 MG tablet Take 1 tablet (40 mg total) by mouth daily. 30 tablet 0   simvastatin  (ZOCOR ) 40 MG tablet Take 40 mg by mouth daily.     No current facility-administered medications for this visit.    Allergies as of 03/16/2024   (No Known Allergies)    Family History  Problem Relation Age of Onset   Diabetes Sister    Diabetes Mother    Breast cancer Mother    Colon cancer Neg Hx    Stomach cancer Neg Hx    Esophageal cancer Neg Hx    Pancreatic cancer Neg Hx     Review of Systems:    Constitutional: No weight loss, fever, chills, weakness or fatigue HEENT: Eyes: No change in vision               Ears, Nose, Throat:  No change in hearing or congestion Skin: No rash or itching Cardiovascular: No chest pain, chest pressure or palpitations   Respiratory: No SOB or cough Gastrointestinal: See HPI and otherwise negative Genitourinary: No dysuria or change in urinary frequency Neurological: No headache, dizziness or syncope Musculoskeletal: No new muscle or joint pain Hematologic: No bleeding or bruising Psychiatric: No history of depression or anxiety    Physical Exam:  Vital signs: BP (!) 142/70   Pulse 82   Ht 5' 7 (1.702 m)   Wt 242 lb 9.6 oz (110 kg)   BMI 38.00 kg/m   Constitutional:   Pleasant male appears to be in NAD, Well developed, Well nourished, alert and cooperative Throat: Oral cavity and pharynx without inflammation, swelling or lesion.  Respiratory: Respirations even and unlabored. Lungs clear to auscultation bilaterally.   No wheezes, crackles, or rhonchi.  Cardiovascular: Normal S1, S2. Regular rate and rhythm. No peripheral edema, cyanosis or pallor.  Gastrointestinal:  Soft, nondistended, nontender. No rebound or guarding. Normal bowel sounds. No appreciable masses or hepatomegaly. Rectal:  Not performed.  Msk:  Symmetrical without gross deformities. Without edema, no deformity or joint abnormality.  Neurologic:  Alert and   oriented x4;  grossly normal neurologically.  Skin:   Dry and intact without significant lesions or rashes.  RELEVANT LABS AND IMAGING: CBC    Latest Ref Rng & Units 12/08/2023    2:13 PM 06/07/2023    8:46 AM 04/05/2023    9:56 AM  CBC  WBC 4.0 - 10.5 K/uL 8.6  7.9  6.9   Hemoglobin 13.0 - 17.0 g/dL 87.1  87.5  87.7   Hematocrit 39.0 - 52.0 % 38.6  38.2  38.7   Platelets 150 - 400 K/uL 273  343  317      CMP     Latest Ref Rng & Units 12/08/2023    2:13 PM 06/07/2023    8:46 AM 03/19/2023   11:58 AM  CMP  Glucose 70 - 99 mg/dL 877  94  86   BUN 8 - 23 mg/dL 31  20  22    Creatinine 0.61 - 1.24 mg/dL 8.83  8.89  8.93   Sodium 135 - 145 mmol/L 137  139  137   Potassium 3.5 - 5.1 mmol/L 4.1  4.6  4.3   Chloride 98 - 111 mmol/L 104  102  101   CO2 22 - 32 mmol/L 28  32  32   Calcium 8.9 - 10.3 mg/dL 9.3  9.5  9.4   Total Protein 6.5 - 8.1 g/dL 7.4  7.7  7.4   Total Bilirubin 0.0 - 1.2 mg/dL 0.8  0.4  0.6   Alkaline Phos 38 - 126 U/L 89  89  91   AST 15 - 41 U/L 17  19  20    ALT 0 - 44 U/L 18  21  25    11/26/22 h pylori stool antigen negative 12/28/23 labs show: hgb 13.8 02/01/24 labs show: iron 52, iron sat 13, ferritin 26, hgb 10.7  Assessment: Encounter Diagnoses  Name Primary?   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  Hemoglobin was 12.8 in April, then 13.8 in Lance Rollins and dropped to 10.7 in July.  This would be around the same time as patient's reported episode of dark stools.  Patient states he has not had any dark stools for over 6 weeks.  Patient is taking daily oral iron.  Patient also taking pantoprazole  p.o. daily.  Patient denies any NSAID use.  Will go ahead and recheck patient's CBC along with iron panel.  If his levels have continued to drop we will consider  proceeding with upper GI  endoscopy.  Will go ahead and increase patient's pantoprazole  to twice daily for 8 weeks then back down to once daily.  Reinforced no NSAIDs.  History of H. pylori.  Follow-up H. pylori stool test was negative indicating eradication.  Plan: -recheck CBC , iron panel, TIBC today -No NSAID's -Increase Pantoprazole  40 mg po daily to twice daily  - Follow-up with hematology as scheduled  Thank you for the courtesy of this consult. Please call me with any questions or concerns.   Lance Krull, FNP-C [facility] 03/16/2024, 3:50 PM  Cc: Lance Carlin Redbird, MD

## 2024-03-17 LAB — IBC + FERRITIN
Ferritin: 7.1 ng/mL — ABNORMAL LOW (ref 22.0–322.0)
Iron: 7 ug/dL — ABNORMAL LOW (ref 42–165)
Saturation Ratios: 1.4 % — ABNORMAL LOW (ref 20.0–50.0)
TIBC: 487.2 ug/dL — ABNORMAL HIGH (ref 250.0–450.0)
Transferrin: 348 mg/dL (ref 212.0–360.0)

## 2024-03-17 NOTE — Progress Notes (Signed)
 I agree with the assessment and plan as outlined by Ms. May.

## 2024-03-21 ENCOUNTER — Encounter: Payer: Self-pay | Admitting: Podiatry

## 2024-03-21 ENCOUNTER — Ambulatory Visit: Admitting: Podiatry

## 2024-03-21 DIAGNOSIS — M7671 Peroneal tendinitis, right leg: Secondary | ICD-10-CM | POA: Diagnosis not present

## 2024-03-21 DIAGNOSIS — Z79899 Other long term (current) drug therapy: Secondary | ICD-10-CM | POA: Insufficient documentation

## 2024-03-21 DIAGNOSIS — M76821 Posterior tibial tendinitis, right leg: Secondary | ICD-10-CM

## 2024-03-21 MED ORDER — TRIAMCINOLONE ACETONIDE 40 MG/ML IJ SUSP
40.0000 mg | Freq: Once | INTRAMUSCULAR | Status: AC
  Administered 2024-03-21 (×2): 40 mg

## 2024-03-22 NOTE — Progress Notes (Signed)
 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  States I think he needs another injection and did rule well for a long time.  Objective: Vital signs are stable he is alert oriented x 3 he has pain on palpation of the posterior tibial tendon just beneath the medial malleolus he also has tenderness on palpation of the peroneal tendons with fluctuance as well just beneath the lateral malleolus.  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  Assessment: Posterior tibial tendinitis peroneal tendinitis.  Plan: Discussed etiology pathology conservative surgical therapies at this point we injected 5 mg of Kenalog  to each site with 5 mg of Marcaine .  Making sure not to inject into either tendon.  Follow-up with him on an as-needed basis did discuss appropriate shoe gear and the possible need for orthotics

## 2024-03-23 ENCOUNTER — Telehealth: Payer: Self-pay | Admitting: Gastroenterology

## 2024-03-23 NOTE — Telephone Encounter (Signed)
 Spoke to patient to discuss labwork and Dr Federico recommendations to schedule EGD. Patient agrees to plan. Will have office set up tomorrow

## 2024-03-27 ENCOUNTER — Ambulatory Visit: Payer: Self-pay

## 2024-03-27 DIAGNOSIS — K921 Melena: Secondary | ICD-10-CM

## 2024-03-27 NOTE — Telephone Encounter (Signed)
 I spoke to Mr. Lance Rollins and I scheduled him for his EGD on 04/05/24 with Dr. Federico.  We reviewed the instructions together but he requested a copy to be mailed to him as well.  Patient agreed with the plan of care and thanked me for calling.

## 2024-04-05 ENCOUNTER — Other Ambulatory Visit (INDEPENDENT_AMBULATORY_CARE_PROVIDER_SITE_OTHER)

## 2024-04-05 ENCOUNTER — Encounter: Payer: Self-pay | Admitting: Internal Medicine

## 2024-04-05 ENCOUNTER — Ambulatory Visit (AMBULATORY_SURGERY_CENTER): Admitting: Internal Medicine

## 2024-04-05 VITALS — BP 129/74 | HR 82 | Temp 97.5°F | Resp 14 | Ht 67.0 in | Wt 242.0 lb

## 2024-04-05 DIAGNOSIS — K2961 Other gastritis with bleeding: Secondary | ICD-10-CM

## 2024-04-05 DIAGNOSIS — K2940 Chronic atrophic gastritis without bleeding: Secondary | ICD-10-CM | POA: Diagnosis not present

## 2024-04-05 DIAGNOSIS — D509 Iron deficiency anemia, unspecified: Secondary | ICD-10-CM | POA: Diagnosis not present

## 2024-04-05 DIAGNOSIS — K921 Melena: Secondary | ICD-10-CM

## 2024-04-05 DIAGNOSIS — K2941 Chronic atrophic gastritis with bleeding: Secondary | ICD-10-CM | POA: Diagnosis not present

## 2024-04-05 LAB — CBC
HCT: 32.4 % — ABNORMAL LOW (ref 39.0–52.0)
Hemoglobin: 9.9 g/dL — ABNORMAL LOW (ref 13.0–17.0)
MCHC: 30.5 g/dL (ref 30.0–36.0)
MCV: 75 fl — ABNORMAL LOW (ref 78.0–100.0)
Platelets: 410 K/uL — ABNORMAL HIGH (ref 150.0–400.0)
RBC: 4.33 Mil/uL (ref 4.22–5.81)
RDW: 18.2 % — ABNORMAL HIGH (ref 11.5–15.5)
WBC: 8.3 K/uL (ref 4.0–10.5)

## 2024-04-05 LAB — IBC + FERRITIN
Ferritin: 8.9 ng/mL — ABNORMAL LOW (ref 22.0–322.0)
Iron: 20 ug/dL — ABNORMAL LOW (ref 42–165)
Saturation Ratios: 4 % — ABNORMAL LOW (ref 20.0–50.0)
TIBC: 501.2 ug/dL — ABNORMAL HIGH (ref 250.0–450.0)
Transferrin: 358 mg/dL (ref 212.0–360.0)

## 2024-04-05 MED ORDER — SODIUM CHLORIDE 0.9 % IV SOLN: 500.0000 mL | Freq: Once | INTRAVENOUS | Status: DC

## 2024-04-05 NOTE — Progress Notes (Signed)
 GASTROENTEROLOGY PROCEDURE H&P NOTE   Primary Care Physician: [name], MD    Reason for Procedure:   IDA, melena, history of H pylori  Plan:    EGD  Patient is appropriate for endoscopic procedure(s) in the ambulatory ([facility]) setting.  The nature of the procedure, as well as the risks, benefits, and alternatives were carefully and thoroughly reviewed with the patient. Ample time for discussion and questions allowed. The patient understood, was satisfied, and agreed to proceed.     HPI: Lance Rollins is a 78 y.o. male who presents for EGD for evaluation of IDA, melena, history of H pylori.  Patient was most recently seen in the Gastroenterology Clinic on 03/16/24.  No interval change in medical history since that appointment. His melena has improved. He has been taking his iron supplements as prescribed. Please refer to that note for full details regarding GI history and clinical presentation.   Past Medical History:  Diagnosis Date   Arthritis    Bleeding per rectum 08/2013   black and bright red (08/14/2013)   Hypercholesteremia    Hypertension    Obesity    Prostate cancer (HCC) 2011   Unilateral primary osteoarthritis, right hip 04/23/2017    Past Surgical History:  Procedure Laterality Date   BIOPSY  09/30/2022   Procedure: BIOPSY;  Surgeon: Eda Iha, MD;  Location: [facility] ENDOSCOPY;  Service: Gastroenterology;;   COLONOSCOPY WITH PROPOFOL  N/A 10/01/2022   Procedure: COLONOSCOPY WITH PROPOFOL ;  Surgeon: Eda Iha, MD;  Location: [facility] ENDOSCOPY;  Service: Gastroenterology;  Laterality: N/A;   ESOPHAGOGASTRODUODENOSCOPY N/A 08/15/2013   Procedure: ESOPHAGOGASTRODUODENOSCOPY (EGD);  Surgeon: Toribio SHAUNNA Cedar, MD;  Location: [facility] ENDOSCOPY;  Service: Endoscopy;  Laterality: N/A;   ESOPHAGOGASTRODUODENOSCOPY (EGD) WITH PROPOFOL  N/A 09/30/2022   Procedure: ESOPHAGOGASTRODUODENOSCOPY (EGD) WITH PROPOFOL ;  Surgeon: Eda Iha, MD;  Location: [facility] ENDOSCOPY;   Service: Gastroenterology;  Laterality: N/A;   GIVENS CAPSULE STUDY  10/01/2022   Procedure: GIVENS CAPSULE STUDY;  Surgeon: Eda Iha, MD;  Location: [facility] ENDOSCOPY;  Service: Gastroenterology;;   INGUINAL HERNIA REPAIR Bilateral ~ 2004   ROBOT ASSISTED LAPAROSCOPIC RADICAL PROSTATECTOMY  08/2009   thelbert 08/14/2009 (08/14/2013)   TOTAL HIP ARTHROPLASTY Left 11/14/2013   Procedure: LEFT TOTAL HIP ARTHROPLASTY ANTERIOR APPROACH;  Surgeon: Lonni CINDERELLA Poli, MD;  Location: [facility] OR;  Service: Orthopedics;  Laterality: Left;   TOTAL HIP ARTHROPLASTY Right 04/23/2017   Procedure: RIGHT TOTAL HIP ARTHROPLASTY ANTERIOR APPROACH;  Surgeon: Poli Lonni CINDERELLA, MD;  Location: [facility] ORS;  Service: Orthopedics;  Laterality: Right;    Prior to Admission medications   Medication Sig Start Date End Date Taking? Authorizing Provider  cyclobenzaprine  (FLEXERIL ) 10 MG tablet TAKE 1 TABLET BY MOUTH THREE TIMES DAILY AS NEEDED FOR MUSCLE SPASMS (HIP TIGHTNESS) 10/22/23  Yes Lovorn, Megan, MD  irbesartan-hydrochlorothiazide (AVALIDE) 300-12.5 MG tablet Take 1 tablet by mouth daily. 11/03/19  Yes [provider]  pantoprazole  (PROTONIX ) 40 MG tablet Take 1 tablet (40 mg total) by mouth 2 (two) times daily before a meal. 03/16/24 04/15/24 Yes May, Deanna J, NP  simvastatin  (ZOCOR ) 40 MG tablet Take 40 mg by mouth daily. 08/09/19  Yes [provider]  diclofenac  (VOLTAREN ) 75 MG EC tablet TAKE 1 TABLET BY MOUTH ONCE DAILY AS NEEDED FOR MILD PAIN 12/10/23   Lovorn, Megan, MD  ferrous sulfate  325 (65 FE) MG EC tablet Take 1 tablet (325 mg total) by mouth daily with breakfast. 10/02/22 03/16/24  Cheryle Page, MD  Multiple Vitamins-Minerals (MULTIVITAMIN  ADULT, MINERALS,) TABS 1 tablet Orally daily    [provider]    Current Outpatient Medications  Medication Sig Dispense Refill   cyclobenzaprine  (FLEXERIL ) 10 MG tablet TAKE 1 TABLET BY MOUTH THREE TIMES DAILY AS NEEDED FOR MUSCLE SPASMS (HIP  TIGHTNESS) 60 tablet 5   irbesartan-hydrochlorothiazide (AVALIDE) 300-12.5 MG tablet Take 1 tablet by mouth daily.     pantoprazole  (PROTONIX ) 40 MG tablet Take 1 tablet (40 mg total) by mouth 2 (two) times daily before a meal. 60 tablet 1   simvastatin  (ZOCOR ) 40 MG tablet Take 40 mg by mouth daily.     diclofenac  (VOLTAREN ) 75 MG EC tablet TAKE 1 TABLET BY MOUTH ONCE DAILY AS NEEDED FOR MILD PAIN 30 tablet 5   ferrous sulfate  325 (65 FE) MG EC tablet Take 1 tablet (325 mg total) by mouth daily with breakfast. 30 tablet 0   Multiple Vitamins-Minerals (MULTIVITAMIN ADULT, MINERALS,) TABS 1 tablet Orally daily     Current Facility-Administered Medications  Medication Dose Route Frequency Provider Last Rate Last Admin   0.9 %  sodium chloride  infusion  500 mL Intravenous Once Federico Rosario BROCKS, MD        Allergies as of 04/05/2024   (No Known Allergies)    Family History  Problem Relation Age of Onset   Diabetes Mother    Breast cancer Mother    Diabetes Sister    Colon cancer Neg Hx    Stomach cancer Neg Hx    Esophageal cancer Neg Hx    Pancreatic cancer Neg Hx    Rectal cancer Neg Hx     Social History   Socioeconomic History   Marital status: Married    Spouse name: Not on file   Number of children: 5   Years of education: Not on file   Highest education level: Not on file  Occupational History   Occupation: retired  Tobacco Use   Smoking status: Never   Smokeless tobacco: Never  Vaping Use   Vaping status: Never Used  Substance and Sexual Activity   Alcohol use: Yes    Comment: 08/14/2013 might have a drink q other holiday   Drug use: No   Sexual activity: Not Currently  Other Topics Concern   Not on file  Social History Narrative   Not on file   Social Drivers of Health   Financial Resource Strain: Not on file  Food Insecurity: No Food Insecurity (09/30/2022)   Hunger Vital Sign    Worried About Running Out of Food in the Last Year: Never true    Ran Out of  Food in the Last Year: Never true  Transportation Needs: No Transportation Needs (09/30/2022)   PRAPARE - Administrator, Civil Service (Medical): No    Lack of Transportation (Non-Medical): No  Physical Activity: Not on file  Stress: Not on file  Social Connections: Not on file  Intimate Partner Violence: Not At Risk (09/30/2022)   Humiliation, Afraid, Rape, and Kick questionnaire    Fear of Current or Ex-Partner: No    Emotionally Abused: No    Physically Abused: No    Sexually Abused: No    Physical Exam: Vital signs in last 24 hours: BP (!) 144/81   Pulse 86   Temp (!) 97.5 F (36.4 C) (Skin)   Ht 5' 7 (1.702 m)   Wt 242 lb (109.8 kg)   SpO2 97%   BMI 37.90 kg/m  GEN: NAD EYE: Sclerae anicteric ENT:  MMM CV: Non-tachycardic Pulm: No increased WOB GI: Soft NEURO:  Alert & Oriented   Estefana Kidney, MD [facility]   04/05/2024 1:48 PM

## 2024-04-05 NOTE — Progress Notes (Signed)
 Called to room to assist during endoscopic procedure.  Patient ID and intended procedure confirmed with present staff. Received instructions for my participation in the procedure from the performing physician.

## 2024-04-05 NOTE — Op Note (Signed)
 [facility] Patient Name: Lance Rollins Procedure Date: 04/05/2024 2:32 PM MRN: [id number] Endoscopist: Rosario Estefana Kidney , , [id number] Age: 78 Referring MD:  Date of Birth: 1945/10/25 Gender: Male Account #: [id number] Procedure:                Upper GI endoscopy Indications:              Iron deficiency anemia, Melena, Follow-up of                            Helicobacter pylori Medicines:                Monitored Anesthesia Care Procedure:                Pre-Anesthesia Assessment:                           - Prior to the procedure, a History and Physical                            was performed, and patient medications and                            allergies were reviewed. The patient's tolerance of                            previous anesthesia was also reviewed. The risks                            and benefits of the procedure and the sedation                            options and risks were discussed with the patient.                            All questions were answered, and informed consent                            was obtained. Prior Anticoagulants: The patient has                            taken no anticoagulant or antiplatelet agents. ASA                            Grade Assessment: II - A patient with mild systemic                            disease. After reviewing the risks and benefits,                            the patient was deemed in satisfactory condition to                            undergo the procedure.  After obtaining informed consent, the endoscope was                            passed under direct vision. Throughout the                            procedure, the patient's blood pressure, pulse, and                            oxygen saturations were monitored continuously. The                            GIF [id number] #[id number] was introduced through the                            mouth, and advanced to the second part  of duodenum.                            The upper GI endoscopy was accomplished without                            difficulty. The patient tolerated the procedure                            well. Scope In: Scope Out: Findings:                 The examined esophagus was normal.                           Localized inflammation characterized by congestion                            (edema), erosions and erythema was found in the                            gastric antrum. Biopsies were taken with a cold                            forceps for histology.                           The examined duodenum was normal. Biopsies were                            taken with a cold forceps for histology. Complications:            No immediate complications. Estimated Blood Loss:     Estimated blood loss was minimal. Impression:               - Normal esophagus.                           - Gastritis. Biopsied.                           - Normal examined  duodenum. Biopsied. Recommendation:           - Discharge patient to home (with escort).                           - Will recheck CBC and ferritin/IBC today                           - Await pathology results.                           - Return to GI clinic in 1-2 months to decide if                            VCE is needed.                           - The findings and recommendations were discussed                            with the patient. Dr Estefana Federico Rosario Estefana Federico,  04/05/2024 2:47:42 PM

## 2024-04-05 NOTE — Patient Instructions (Signed)
-  Handout on gastritis provided -await pathology results -Continue present medications   YOU HAD AN ENDOSCOPIC PROCEDURE TODAY AT THE [facility]:   Refer to the procedure report that was given to you for any specific questions about what was found during the examination.  If the procedure report does not answer your questions, please call your gastroenterologist to clarify.  If you requested that your care partner not be given the details of your procedure findings, then the procedure report has been included in a sealed envelope for you to review at your convenience later.  YOU SHOULD EXPECT: Some feelings of bloating in the abdomen. Passage of more gas than usual.  Walking can help get rid of the air that was put into your GI tract during the procedure and reduce the bloating. If you had a lower endoscopy (such as a colonoscopy or flexible sigmoidoscopy) you may notice spotting of blood in your stool or on the toilet paper. If you underwent a bowel prep for your procedure, you may not have a normal bowel movement for a few days.  Please Note:  You might notice some irritation and congestion in your nose or some drainage.  This is from the oxygen used during your procedure.  There is no need for concern and it should clear up in a day or so.  SYMPTOMS TO REPORT IMMEDIATELY:  Following upper endoscopy (EGD)  Vomiting of blood or coffee ground material  New chest pain or pain under the shoulder blades  Painful or persistently difficult swallowing  New shortness of breath  Fever of 100F or higher  Black, tarry-looking stools  For urgent or emergent issues, a gastroenterologist can be reached at any hour by calling [phone]. Do not use MyChart messaging for urgent concerns.    DIET:  We do recommend a small meal at first, but then you may proceed to your regular diet.  Drink plenty of fluids but you should avoid alcoholic beverages for 24 hours.  ACTIVITY:  You should plan  to take it easy for the rest of today and you should NOT DRIVE or use heavy machinery until tomorrow (because of the sedation medicines used during the test).    FOLLOW UP: Our staff will call the number listed on your records the next business day following your procedure.  We will call around 7:15- 8:00 am to check on you and address any questions or concerns that you may have regarding the information given to you following your procedure. If we do not reach you, we will leave a message.     If any biopsies were taken you will be contacted by phone or by letter within the next 1-3 weeks.  Please call us at [phone] if you have not heard about the biopsies in 3 weeks.    SIGNATURES/CONFIDENTIALITY: You and/or your care partner have signed paperwork which will be entered into your electronic medical record.  These signatures attest to the fact that that the information above on your After Visit Summary has been reviewed and is understood.  Full responsibility of the confidentiality of this discharge information lies with you and/or your care-partner.

## 2024-04-05 NOTE — Progress Notes (Signed)
 Report to PACU, RN, vss, BBS= Clear.

## 2024-04-05 NOTE — Progress Notes (Signed)
Pt. states no medical or surgical changes since previsit or office visit. 

## 2024-04-06 ENCOUNTER — Telehealth: Payer: Self-pay | Admitting: *Deleted

## 2024-04-06 ENCOUNTER — Ambulatory Visit: Payer: Self-pay | Admitting: Internal Medicine

## 2024-04-06 NOTE — Telephone Encounter (Signed)
  Follow up Call-     04/05/2024    1:34 PM  Call back number  Post procedure Call Back phone  # [phone]  Permission to leave phone message Yes     Patient questions:  Do you have a fever, pain , or abdominal swelling? No. Pain Score  0 *  Have you tolerated food without any problems? Yes.    Have you been able to return to your normal activities? Yes.    Do you have any questions about your discharge instructions: Diet   No. Medications  No. Follow up visit  No.  Do you have questions or concerns about your Care? No.  Actions: * If pain score is 4 or above: No action needed, pain <4.

## 2024-04-11 LAB — SURGICAL PATHOLOGY

## 2024-04-12 ENCOUNTER — Encounter: Payer: Self-pay | Admitting: Physical Medicine and Rehabilitation

## 2024-04-12 ENCOUNTER — Encounter: Attending: Physical Medicine and Rehabilitation | Admitting: Physical Medicine and Rehabilitation

## 2024-04-12 VITALS — BP 157/79 | HR 92 | Ht 67.0 in | Wt 243.4 lb

## 2024-04-12 DIAGNOSIS — M7061 Trochanteric bursitis, right hip: Secondary | ICD-10-CM | POA: Diagnosis not present

## 2024-04-12 DIAGNOSIS — G894 Chronic pain syndrome: Secondary | ICD-10-CM | POA: Insufficient documentation

## 2024-04-12 DIAGNOSIS — M7062 Trochanteric bursitis, left hip: Secondary | ICD-10-CM | POA: Insufficient documentation

## 2024-04-12 DIAGNOSIS — M62838 Other muscle spasm: Secondary | ICD-10-CM | POA: Diagnosis not present

## 2024-04-12 MED ORDER — CYCLOBENZAPRINE HCL 10 MG PO TABS: 10.0000 mg | ORAL_TABLET | Freq: Three times a day (TID) | ORAL | 5 refills | Status: DC | PRN

## 2024-04-12 NOTE — Progress Notes (Signed)
 Subjective:    Patient ID: Lance Rollins, male    DOB: 01/05/1946, 78 y.o.   MRN: [id number]  HPI Patient is a 78 yr old male with B/L trochanteric bursitis s/p injections on L. Here for f/u on trochanteric bursae.  Has lost 25 lbs since 2021-  Here for f/u on B/L trochanteric bursitis.      Pain in 1 hip 8/10- just aggravating in L hip- and the other 0/10.   Doesn't want injections today.   Cut gym time back to 3-4x/week-  Keeping active-   Still taking Diclofenac  75 mg 1x/day-  Every day-  in AM Still also taking muscle spasms medicine Flexeril - 1x/day- at bedtime.    L hip got worse- if overdoes it- sets it off.  Started back 1 week ago- and grabbed cane to walk.  Only uses cane when sets off the pain Set it off by mowing 2 different lawns- in a row.   Mother is 69 yrs old. And Calls him [place] from [place].    Takes awhile for meds to  kick in- although is working.  Has been bothering him for 10 years.    Pain Inventory Average Pain 8 Pain Right Now 8 My pain is intermittent, burning, and left hip is 8 right hip is 0  In the last 24 hours, has pain interfered with the following? General activity 0 Relation with others 0 Enjoyment of life 0 What TIME of day is your pain at its worst? varies Sleep (in general) NA  Pain is worse with: sitting Pain improves with: heat/ice and therapy/exercise Relief from Meds: no meds  Family History  Problem Relation Age of Onset   Diabetes Mother    Breast cancer Mother    Diabetes Sister    Colon cancer Neg Hx    Stomach cancer Neg Hx    Esophageal cancer Neg Hx    Pancreatic cancer Neg Hx    Rectal cancer Neg Hx    Social History   Socioeconomic History   Marital status: Married    Spouse name: Not on file   Number of children: 5   Years of education: Not on file   Highest education level: Not on file  Occupational History   Occupation: retired  Tobacco Use   Smoking status: Never   Smokeless tobacco:  Never  Vaping Use   Vaping status: Never Used  Substance and Sexual Activity   Alcohol use: Yes    Comment: 08/14/2013 might have a drink q other holiday   Drug use: No   Sexual activity: Not Currently  Other Topics Concern   Not on file  Social History Narrative   Not on file   Social Drivers of Health   Financial Resource Strain: Not on file  Food Insecurity: No Food Insecurity (09/30/2022)   Hunger Vital Sign    Worried About Running Out of Food in the Last Year: Never true    Ran Out of Food in the Last Year: Never true  Transportation Needs: No Transportation Needs (09/30/2022)   PRAPARE - Administrator, Civil Service (Medical): No    Lack of Transportation (Non-Medical): No  Physical Activity: Not on file  Stress: Not on file  Social Connections: Not on file   Past Surgical History:  Procedure Laterality Date   BIOPSY  09/30/2022   Procedure: BIOPSY;  Surgeon: Eda Iha, MD;  Location[facility] END[facility]COPY;  Service: Gastroenterology;;   COLONOSCOPY WITH PROPOFOL  N/A 10/01/2022  Procedure: COLONOSCOPY WITH PROPOFOL ;  Surgeon: Eda Iha, MD;  Location: [facility] ENDOSCOPY;  Service: Gastroenterology;  Laterality: N/A;   ESOPHAGOGASTRODUODENOSCOPY N/A 08/15/2013   Procedure: ESOPHAGOGASTRODUODENOSCOPY (EGD);  Surgeon: Toribio SHAUNNA Cedar, MD;  Location: [facility] ENDOSCOPY;  Service: Endoscopy;  Laterality: N/A;   ESOPHAGOGASTRODUODENOSCOPY (EGD) WITH PROPOFOL  N/A 09/30/2022   Procedure: ESOPHAGOGASTRODUODENOSCOPY (EGD) WITH PROPOFOL ;  Surgeon: Eda Iha, MD;  Location: [facility] ENDOSCOPY;  Service: Gastroenterology;  Laterality: N/A;   GIVENS CAPSULE STUDY  10/01/2022   Procedure: GIVENS CAPSULE STUDY;  Surgeon: Eda Iha, MD;  Location: [facility] ENDOSCOPY;  Service: Gastroenterology;;   INGUINAL HERNIA REPAIR Bilateral ~ 2004   ROBOT ASSISTED LAPAROSCOPIC RADICAL PROSTATECTOMY  08/2009   thelbert 08/14/2009 (08/14/2013)   TOTAL HIP ARTHROPLASTY Left 11/14/2013   Procedure:  LEFT TOTAL HIP ARTHROPLASTY ANTERIOR APPROACH;  Surgeon: Lonni CINDERELLA Poli, MD;  Location: [facility] OR;  Service: Orthopedics;  Laterality: Left;   TOTAL HIP ARTHROPLASTY Right 04/23/2017   Procedure: RIGHT TOTAL HIP ARTHROPLASTY ANTERIOR APPROACH;  Surgeon: Poli Lonni CINDERELLA, MD;  Location: [facility] ORS;  Service: Orthopedics;  Laterality: Right;   Past Surgical History:  Procedure Laterality Date   BIOPSY  09/30/2022   Procedure: BIOPSY;  Surgeon: Eda Iha, MD;  Location: [facility] ENDOSCOPY;  Service: Gastroenterology;;   COLONOSCOPY WITH PROPOFOL  N/A 10/01/2022   Procedure: COLONOSCOPY WITH PROPOFOL ;  Surgeon: Eda Iha, MD;  Location: [facility] ENDOSCOPY;  Service: Gastroenterology;  Laterality: N/A;   ESOPHAGOGASTRODUODENOSCOPY N/A 08/15/2013   Procedure: ESOPHAGOGASTRODUODENOSCOPY (EGD);  Surgeon: Toribio SHAUNNA Cedar, MD;  Location: [facility] ENDOSCOPY;  Service: Endoscopy;  Laterality: N/A;   ESOPHAGOGASTRODUODENOSCOPY (EGD) WITH PROPOFOL  N/A 09/30/2022   Procedure: ESOPHAGOGASTRODUODENOSCOPY (EGD) WITH PROPOFOL ;  Surgeon: Eda Iha, MD;  Location: [facility] ENDOSCOPY;  Service: Gastroenterology;  Laterality: N/A;   GIVENS CAPSULE STUDY  10/01/2022   Procedure: GIVENS CAPSULE STUDY;  Surgeon: Eda Iha, MD;  Location: [facility] ENDOSCOPY;  Service: Gastroenterology;;   INGUINAL HERNIA REPAIR Bilateral ~ 2004   ROBOT ASSISTED LAPAROSCOPIC RADICAL PROSTATECTOMY  08/2009   thelbert 08/14/2009 (08/14/2013)   TOTAL HIP ARTHROPLASTY Left 11/14/2013   Procedure: LEFT TOTAL HIP ARTHROPLASTY ANTERIOR APPROACH;  Surgeon: Lonni CINDERELLA Poli, MD;  Location: [facility] OR;  Service: Orthopedics;  Laterality: Left;   TOTAL HIP ARTHROPLASTY Right 04/23/2017   Procedure: RIGHT TOTAL HIP ARTHROPLASTY ANTERIOR APPROACH;  Surgeon: Poli Lonni CINDERELLA, MD;  Location: [facility] ORS;  Service: Orthopedics;  Laterality: Right;   Past Medical History:  Diagnosis Date   Arthritis    Bleeding per rectum 08/2013   black and bright red  (08/14/2013)   Hypercholesteremia    Hypertension    Obesity    Prostate cancer (HCC) 2011   Unilateral primary osteoarthritis, right hip 04/23/2017   BP (!) 157/79   Pulse 92   Ht 5' 7 (1.702 m)   Wt 243 lb 6.4 oz (110.4 kg)   SpO2 96%   BMI 38.12 kg/m   Opioid Risk Score:   Fall Risk Score:  `1  Depression screen [facility] 2/9     04/12/2024    8:51 AM 10/11/2023    8:55 AM 09/20/2023    2:19 PM 09/21/2022    2:02 PM 03/20/2022    2:46 PM 09/19/2021    2:36 PM 06/16/2021    1:59 PM  Depression screen PHQ 2/9  Decreased Interest 0 0 0 0 0 0 0  Down, Depressed, Hopeless 0 0 0 0  0 0  PHQ - 2 Score 0 0 0 0 0 0 0  Review of Systems  Musculoskeletal:  Positive for gait problem.       Right hip no pain, left hip 8  All other systems reviewed and are negative.      Objective:   Physical Exam  Awake, alert, appropriate, using cane which doesn't usually use- on R side, NAD TTP over L trochanteric bursa.        Assessment & Plan:   Patient is a 78 yr old male with B/L trochanteric bursitis s/p injections on L. Here for f/u on trochanteric bursae.  Has lost 25 lbs since 2021-  Here for f/u on B/L trochanteric bursitis.       Last BUN 31- 4 months ago  usually  20-21- but not sure why- Cr 1.16-  usually 0.97 to 1.16.   2.  PCP retiring at end of year- will need to get another one-  Will change of Dr Bebe- where Dr Okey office is.    3.  We discussed getting trochanteric bursa injection on L-call me if needs to be seen before 6 months.    4. Does stretches for ITB band and piriformis pain!   5. Discussed options for pain- tramadol  vs Diclofenac  AND voltaren  gel- - can use the voltaren  gel up to 4x/day as needed- we decided to do the gel AND pill Dilcofenac- and wai ton Tramadol , so doesn't have ot come see me q3 months.   6. Con't Cyclobenzaprine - the muscle relaxant 3x/day as needed for muscle spasms-   7. F/U in 6 months- for possible steroid injections.    I  spent a total of  23  minutes on total care today- >50% coordination of care- due to  D/w pt of options of pain mgmt- and made decision- as detailed above- reviewed chart notes

## 2024-04-12 NOTE — Patient Instructions (Signed)
 Patient is a 78 yr old male with B/L trochanteric bursitis s/p injections on L. Here for f/u on trochanteric bursae.  Has lost 25 lbs since 2021-  Here for f/u on B/L trochanteric bursitis.       Last BUN 31- 4 months ago  usually  20-21- but not sure why- Cr 1.16-  usually 0.97 to 1.16.   2.  PCP retiring at end of year- will need to get another one-  Will change of Dr Bebe- where Dr Okey office is.    3.  We discussed getting trochanteric bursa injection on L-call me if needs to be seen before 6 months.    4. Does stretches for ITB band and piriformis pain!   5. Discussed options for pain- tramadol  vs Diclofenac  AND voltaren  gel- - can use the voltaren  gel up to 4x/day as needed- we decided to do the gel AND pill Dilcofenac- and wai ton Tramadol , so doesn't have ot come see me q3 months.   6. Con't Cyclobenzaprine - the muscle relaxant 3x/day as needed for muscle spasms-   7. F/U in 6 months- for possible steroid injections.

## 2024-04-24 ENCOUNTER — Encounter: Payer: Self-pay | Admitting: Cardiology

## 2024-04-24 ENCOUNTER — Ambulatory Visit: Attending: Cardiology | Admitting: Cardiology

## 2024-04-24 VITALS — BP 136/68 | HR 89 | Ht 67.0 in | Wt 238.0 lb

## 2024-04-24 DIAGNOSIS — I10 Essential (primary) hypertension: Secondary | ICD-10-CM

## 2024-04-24 DIAGNOSIS — E7800 Pure hypercholesterolemia, unspecified: Secondary | ICD-10-CM | POA: Diagnosis not present

## 2024-04-24 DIAGNOSIS — R011 Cardiac murmur, unspecified: Secondary | ICD-10-CM | POA: Insufficient documentation

## 2024-04-24 DIAGNOSIS — E669 Obesity, unspecified: Secondary | ICD-10-CM | POA: Diagnosis not present

## 2024-04-24 DIAGNOSIS — R600 Localized edema: Secondary | ICD-10-CM

## 2024-04-24 NOTE — Assessment & Plan Note (Signed)
 Systolic ejection murmur at aortic position, 3/6 grade, likely due to aortic valve sclerosis or stenosis. Asymptomatic, indicating non-severe stenosis. Pathophysiology involves calcium buildup on the valve. - Order 2D echocardiogram to evaluate aortic valve function and pressure gradient. - Schedule follow-up in 3-4 months to discuss echocardiogram results. - Consider more aggressive cholesterol management if echocardiogram shows significant stenosis.

## 2024-04-24 NOTE — Assessment & Plan Note (Signed)
 Bilateral lower extremity edema with venous stasis dermatitis Chronic edema with venous stasis dermatitis due to venous insufficiency. Not associated with heart failure. - Order venous reflux Doppler ultrasound to assess for venous insufficiency. - Advise leg elevation and use of moderate weight support stockings. - Encourage calf muscle exercises to improve venous return.

## 2024-04-24 NOTE — Progress Notes (Signed)
 EKG

## 2024-04-24 NOTE — Progress Notes (Signed)
 Cardiology Office Note:  .   Date:  04/24/2024  ID:  Kayla Agent, DOB 1945-09-20, MRN [id number] PCP: [name], MD  [facility] Providers Cardiologist:  Alm Clay, MD     Chief Complaint  Patient presents with   New Patient (Initial Visit)    Heart Murmur - NO Symptoms    Patient Profile: .     Lance Rollins is a moderately obese 78 y.o. male with a PMH notable for Hypertension, Hyperlipidemia, and History of Prostate Cancer and MGUS who presents here for Evaluation of Heart Murmur at the request of [name], MD.  Other PMH includes OA, history of GI bleed with gastritis and duodenitis as well as gastric polyps, rectal dysfunction     Zedric Deroy was last seen by Dr. Okey on May 20 murmur noted on exam.  Referred to cardiology.  Subjective  Discussed the use of AI scribe software for clinical note transcription with the patient, who gave verbal consent to proceed.  History of Present Illness Lance Rollins is a 78 year old male who presents with a heart murmur. He was referred by Dr. Okey for evaluation of a heart murmur.  He has no history of chest pain, shortness of breath, or palpitations. He exercises regularly, attending the gym three to four times a week, without significant symptoms during physical activity.  He has a history of hypertension and hyperlipidemia. His blood pressure is reportedly well-controlled, although he reports feeling nervous during today's visit. He has been on simvastatin  for about a year and a half for cholesterol management, but recent checks indicated suboptimal control. He maintains an active lifestyle, which includes regular exercise.  He experiences swelling in his legs, which reduces with elevation overnight. The swelling is primarily located around the ankles and is associated with heaviness and fatigue, especially after physical activities like yard work.  His past medical history includes a gastrointestinal  bleed in 2014, which led to hospitalization and an endoscopic evaluation. 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 He has undergone bilateral hip replacements.  Family history is significant for heart disease, as his father had a pulmonary embolism and passed away during a procedure for a blood clot. There is no reported family history of heart attacks or heart rhythm issues.  No recent episodes of blood in the stool, although he experienced this about three months ago. A recent colonoscopy two weeks ago was normal.   Cardiovascular ROS: no chest pain or dyspnea on exertion positive for - edema and usually the swelling goes down when he puts his feet up at night, but it starts in the morning when he is on his feet especially if he is on his feet for long period time.  He has some discoloration of the bilateral inner ankles; he may occasionally have aching in his thighs when he is exercising, but not necessarily with just walking.  He denies any restless leg. negative for - irregular heartbeat, orthopnea, palpitations, paroxysmal nocturnal dyspnea, rapid heart rate, shortness of breath, or lightheadedness or wooziness.  History of syncope but not recently.  No recurrent syncope or near syncope, TIA or amaurosis fugax.  No claudication.  ROS:  Review of Systems - Negative except symptoms noted above    Objective   Current Meds  Medication Sig   cyclobenzaprine  (FLEXERIL ) 10 MG tablet Take 1 tablet (10 mg total) by mouth 3 (three) times daily as needed for muscle  spasms.   diclofenac  (VOLTAREN ) 75 MG EC tablet TAKE 1 TABLET BY MOUTH ONCE DAILY AS NEEDED FOR MILD PAIN   ferrous sulfate  325 (65 FE) MG EC tablet Take 1 tablet (325 mg total) by mouth daily with breakfast.   irbesartan-hydrochlorothiazide (AVALIDE) 300-12.5 MG tablet Take 1 tablet by mouth daily.   Multiple Vitamins-Minerals (MULTIVITAMIN ADULT, MINERALS,) TABS 1 tablet Orally daily    pantoprazole  (PROTONIX ) 40 MG tablet Take 1 tablet (40 mg total) by mouth 2 (two) times daily before a meal.   simvastatin  (ZOCOR ) 40 MG tablet Take 40 mg by mouth daily.    PSH: Bilateral inguinal hernia repair, colonoscopy with adenomatous polyp ectomy 2015; right total hip 2018, left total hip 2015  Social history:  Married father of 5 (2 boys and 3 girls.  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  1 Acharya caffeine a day.  Diet: Cut down on fried food, eating 1-2 servings of fruit vegetable a day.  Exercise: Stationary bicycle daily.  Gym 4 days a week.  Studies Reviewed: SABRA   EKG Interpretation Date/Time:  Monday April 24 2024 08:19:01 EDT Ventricular Rate:  89 PR Interval:  164 QRS Duration:  88 QT Interval:  350 QTC Calculation: 425 R Axis:   2  Text Interpretation: Normal sinus rhythm Normal ECG When compared with ECG of 29-Sep-2022 19:49, No significant change was found Confirmed by Anner Lenis ([id number]) on 04/24/2024 8:32:57 AM    Labs from PCP: TC 194, TG 65, HDL 51, LDL 131.  WBC 8.3, Hgb 13.8, PLT 311.  Glucose 83, BUN 29, creatinine 1.10, sodium 137 and potassium 4.4.  AST ALT 19-22, alk phos 85.  (03/24/2024) Results DIAGNOSTIC Colonoscopy: Normal (04/10/2024) No prior cardiac study  Risk Assessment/Calculations:          Physical Exam:   VS:  BP 136/68   Pulse 89   Ht 5' 7 (1.702 m)   Wt 238 lb (108 kg)   SpO2 95%   BMI 37.28 kg/m    Wt Readings from Last 3 Encounters:  04/24/24 238 lb (108 kg)  04/12/24 243 lb 6.4 oz (110.4 kg)  04/05/24 242 lb (109.8 kg)    Physical Exam CARDIOVASCULAR: Systolic ejection murmur in aortic position, grade 3/6, radiating to carotid arteries. MUSCULOSKELETAL: Bilateral edema with stasis dermatitis.   GEN: Well nourished, well developed in no acute distress; moderately obese NECK: No JVD; No carotid bruits CARDIAC: Normal S1, S2; RRR, no murmurs, rubs, gallops RESPIRATORY:  Clear to auscultation without rales, wheezing  or rhonchi ; nonlabored, good air movement. ABDOMEN: Soft, non-tender, non-distended EXTREMITIES:  No edema; No deformity      ASSESSMENT AND PLAN: .    Problem List Items Addressed This Visit       Cardiology Problems   Primary hypertension (Chronic)   Borderline blood pressure today.  Better on repeat.  Likely related to whitecoat.  For now continue irbesartan and HCTZ 300-12.5 mg daily.      Relevant Orders   EKG 12-Lead (Completed)   ECHOCARDIOGRAM COMPLETE   Pure hypercholesterolemia (Chronic)   Poorly controlled lipids based on last check with an LDL 131.  He has been on simvastatin  40 mg for over a year. Cholesterol levels suboptimal on simvastatin . Potential need for aggressive management based on echocardiogram findings. - Continue simvastatin  therapy. - Reevaluate cholesterol management after echocardiogram results.       Relevant Orders   ECHOCARDIOGRAM COMPLETE   VAS US  LOWER EXTREMITY VENOUS REFLUX  Other   Bilateral leg edema (Chronic)   Bilateral lower extremity edema with venous stasis dermatitis Chronic edema with venous stasis dermatitis due to venous insufficiency. Not associated with heart failure. - Order venous reflux Doppler ultrasound to assess for venous insufficiency. - Advise leg elevation and use of moderate weight support stockings. - Encourage calf muscle exercises to improve venous return.      Relevant Orders   VAS US  LOWER EXTREMITY VENOUS REFLUX   Moderate obesity (Chronic)   He is active and exercises, remains obese.  Defer to PCP for now.      Systolic ejection murmur - Primary (Chronic)   Systolic ejection murmur at aortic position, 3/6 grade, likely due to aortic valve sclerosis or stenosis. Asymptomatic, indicating non-severe stenosis. Pathophysiology involves calcium buildup on the valve. - Order 2D echocardiogram to evaluate aortic valve function and pressure gradient. - Schedule follow-up in 3-4 months to discuss  echocardiogram results. - Consider more aggressive cholesterol management if echocardiogram shows significant stenosis.      Relevant Orders   ECHOCARDIOGRAM COMPLETE        Follow-Up: Return in about 4 months (around 08/24/2024) for To discuss test results, [employer].     Signed, Alm MICAEL Clay, MD, MS Alm Clay, M.D., M.S. Interventional Cardiologist  [facility] Pager # [phone]

## 2024-04-24 NOTE — Assessment & Plan Note (Signed)
 He is active and exercises, remains obese.  Defer to PCP for now.

## 2024-04-24 NOTE — Assessment & Plan Note (Addendum)
 Poorly controlled lipids based on last check with an LDL 131.  He has been on simvastatin  40 mg for over a year. Cholesterol levels suboptimal on simvastatin . Potential need for aggressive management based on echocardiogram findings. - Continue simvastatin  therapy. - Reevaluate cholesterol management after echocardiogram results.

## 2024-04-24 NOTE — Patient Instructions (Signed)
 Medication Instructions:  No changes   *If you need a refill on your cardiac medications before your next appointment, please call your pharmacy*   Lab Work: Not needed If you have labs (blood work) drawn today and your tests are completely normal, you will receive your results only by: MyChart Message (if you have MyChart) OR A paper copy in the mail If you have any lab test that is abnormal or we need to change your treatment, we will call you to review the results.   Testing/Procedures: 1) Your physician has requested that you have an echocardiogram. Echocardiography is a painless test that uses sound waves to create images of your heart. It provides your doctor with information about the size and shape of your heart and how well your heart's chambers and valves are working. This procedure takes approximately one hour. There are no restrictions for this procedure. Please do NOT wear cologne, perfume, aftershave, or lotions (deodorant is allowed). Please arrive 15 minutes prior to your appointment time.  Please note: We ask at that you not bring children with you during ultrasound (echo/ vascular) testing. Due to room size and safety concerns, children are not allowed in the ultrasound rooms during exams. Our front office staff cannot provide observation of children in our lobby area while testing is being conducted. An adult accompanying a patient to their appointment will only be allowed in the ultrasound room at the discretion of the ultrasound technician under special circumstances. We apologize for any inconvenience.   2) Your physician has requested that you have a lower  extremity venous duplex  - venous reflux. This test is an ultrasound of the veins in the legs. It looks at venous blood flow that carries blood from the heart to the legs. Allow one hour for a Lower Venous exam.  There are no restrictions or special instructions.  Please note: We ask at that you not bring children  with you during ultrasound (echo/ vascular) testing. Due to room size and safety concerns, children are not allowed in the ultrasound rooms during exams. Our front office staff cannot provide observation of children in our lobby area while testing is being conducted. An adult accompanying a patient to their appointment will only be allowed in the ultrasound room at the discretion of the ultrasound technician under special circumstances. We apologize for any inconvenience.    Follow-Up: At [facility], you and your health needs are our priority.  As part of our continuing mission to provide you with exceptional heart care, we have created designated Provider Care Teams.  These Care Teams include your primary Cardiologist (physician) and Advanced Practice Providers (APPs -  Physician Assistants and Nurse Practitioners) who all work together to provide you with the care you need, when you need it.     Your next appointment:   3 to 4 month(s)  The format for your next appointment:   In Person  Provider:   Alm Clay, MD   Other Instructions     Keep elevate your legs  at least 3 times a day for 30  minutes.SABRA purchase and wear compression socks  ( 20- 30 mm/hg)

## 2024-04-24 NOTE — Assessment & Plan Note (Signed)
 Borderline blood pressure today.  Better on repeat.  Likely related to whitecoat.  For now continue irbesartan and HCTZ 300-12.5 mg daily.

## 2024-05-08 ENCOUNTER — Telehealth: Payer: Self-pay | Admitting: Internal Medicine

## 2024-05-08 NOTE — Telephone Encounter (Signed)
 Spoke to pt wife Naomie.  Dorothy requested results from recent labs.  Recent results and providers recommendations provided. Dorothy verbalized understanding with all questions answered.

## 2024-05-08 NOTE — Telephone Encounter (Signed)
 Inbound call from patient wife stating she would like to receive a call from nurse in regards to husbands results from 04/05/24  Please advise  Thank you

## 2024-05-11 ENCOUNTER — Encounter: Payer: Self-pay | Admitting: Gastroenterology

## 2024-05-11 ENCOUNTER — Ambulatory Visit: Admitting: Gastroenterology

## 2024-05-11 ENCOUNTER — Other Ambulatory Visit (INDEPENDENT_AMBULATORY_CARE_PROVIDER_SITE_OTHER)

## 2024-05-11 VITALS — BP 132/76 | HR 83 | Ht 67.0 in | Wt 239.1 lb

## 2024-05-11 DIAGNOSIS — D509 Iron deficiency anemia, unspecified: Secondary | ICD-10-CM | POA: Diagnosis not present

## 2024-05-11 DIAGNOSIS — K31A0 Gastric intestinal metaplasia, unspecified: Secondary | ICD-10-CM

## 2024-05-11 DIAGNOSIS — Z8619 Personal history of other infectious and parasitic diseases: Secondary | ICD-10-CM | POA: Diagnosis not present

## 2024-05-11 LAB — IBC + FERRITIN
Ferritin: 7.7 ng/mL — ABNORMAL LOW (ref 22.0–322.0)
Iron: 19 ug/dL — ABNORMAL LOW (ref 42–165)
Saturation Ratios: 3.9 % — ABNORMAL LOW (ref 20.0–50.0)
TIBC: 491.4 ug/dL — ABNORMAL HIGH (ref 250.0–450.0)
Transferrin: 351 mg/dL (ref 212.0–360.0)

## 2024-05-11 LAB — CBC WITH DIFFERENTIAL/PLATELET
Basophils Absolute: 0 K/uL (ref 0.0–0.1)
Basophils Relative: 0.5 % (ref 0.0–3.0)
Eosinophils Absolute: 0.3 K/uL (ref 0.0–0.7)
Eosinophils Relative: 3.1 % (ref 0.0–5.0)
HCT: 30 % — ABNORMAL LOW (ref 39.0–52.0)
Hemoglobin: 9.3 g/dL — ABNORMAL LOW (ref 13.0–17.0)
Lymphocytes Relative: 23.5 % (ref 12.0–46.0)
Lymphs Abs: 2.2 K/uL (ref 0.7–4.0)
MCHC: 30.9 g/dL (ref 30.0–36.0)
MCV: 70.2 fl — ABNORMAL LOW (ref 78.0–100.0)
Monocytes Absolute: 0.9 K/uL (ref 0.1–1.0)
Monocytes Relative: 9.7 % (ref 3.0–12.0)
Neutro Abs: 5.9 K/uL (ref 1.4–7.7)
Neutrophils Relative %: 63.2 % (ref 43.0–77.0)
Platelets: 403 K/uL — ABNORMAL HIGH (ref 150.0–400.0)
RBC: 4.27 Mil/uL (ref 4.22–5.81)
RDW: 20 % — ABNORMAL HIGH (ref 11.5–15.5)
WBC: 9.3 K/uL (ref 4.0–10.5)

## 2024-05-11 NOTE — Progress Notes (Signed)
 Chief Complaint: follow-up, black stools, Hgb dropping  Primary GI Doctor:(Dr. Eda) Dr. Federico   HPI: Mr. Lance Rollins is a 78 year old African-American male with a past medical history as listed below including hypertension, obesity and chronic pain secondary to bilateral hip bursitis/trochanteric bursitis and history of prostatic adenocarcinoma status post radical prostatectomy in 2011, who presents to clinic today for follow-up  on black stools, Hgb dropping.     09/30/2022 patient consulted by our service in the hospital for symptomatic anemia.  At that time hemoglobin down to 7.5 (from baseline of 12.9 in 2021), had some palpitations/indigestion and episode of syncope.  Had a recent colonoscopy in 2021 and recommendations for EGD were made.     09/30/2022 EGD with erythematous mucosa in the antrum and otherwise normal.  Patient reduced to Pantoprazole  40 mg daily for 8 weeks.  Recommended a colonoscopy.  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  Patient was started on quadruple therapy.  Repeat H. pylori stool testing recommended 2 weeks after treatment.    10/01/2022 colonoscopy with diverticulosis and otherwise normal.  A capsule endoscopy was placed.    10/01/22 capsule endoscopy with gastric erosions noted likely from gastric biopsy site and otherwise negative.  Patient was continued on oral iron therapy and told to repeat iron studies in 3 to 4 months.    10/02/2022 CBC with a hemoglobin of 8.2.  MCV low at 69.4.    11/25/2022 appointment in GI office with [name], [place] for follow-up on anemia.  Patient completed H. pylori treatment.  H. pylori stool antigen test ordered and negative.  Lab work collected to reevaluate anemia and iron deficiency.  Labs stable and patient was continued on iron supplementation.    03/01/2023 [name], [place] recheck patient's lab work which revealed iron deficiency despite being on iron supplementation.  Referral was placed to hematology. 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  Bone marrow biopsy was recommended to rule out multiple myeloma. 05/2023 Dr. Sherrod explained he does not meet criteria for multiple myeloma but he has a condition called MGUS and there is a 1% chance risk of this converting to multiple myeloma per year.  12/2023  appt with hematology -He is currently on observation for MGUS and takes over-the-counter oral iron supplements with vitamin C. His anemia has shown improvement, with a recent hemoglobin level of 12.8, up from 12.2. Iron studies and ferritin levels are stable.  Patient last seen by myself on 03/16/24. Ordered lab work as below. 03/16/2024 labs show: Iron 7, ferritin 7.1, hemoglobin 8.8.  Ordered EGD 04/05/24 EGD- Normal esophagus. Gastritis. Biopsied. Normal examined duodenum. Biopsied. Path: showed inflammation in the stomach and gastric intestinal metaplasia.  04/05/2024 labs show: Iron 20, ferritin 8.9, hemoglobin 9.9   Interval History      Patient presents for follow-up on melena and anemia. Accompanied by his wife. We recently did endoscopy for low Hgb. We discussed and reviewed results. Patient is taking ferrous sulfate  1 tablet po daily.  Patient also sees hematology every 6 months. Patient ha snot had any dark stools since last GI visit on August 7th.  No nausea, vomiting or abdominal. Appetite is good. No changes to medications. Patient still taking Pantoprazole  40 mg po twice daily. No blood thinners.  Denies taking NSAIDs. No fatigue, chest pain, sob.   Nonsmoker. No alcohol use.  No family  history of GI issues or CA.   Wt Readings from Last 3 Encounters:  05/11/24 239 lb 2 oz (108.5 kg)  04/24/24 238 lb (108 kg)  04/12/24 243 lb 6.4 oz (110.4 kg)    Past Medical History:  Diagnosis Date   Arthritis    Bleeding per rectum 08/2013   black and bright  red (08/14/2013)   Hypercholesteremia    Hypertension    Obesity    Prostate cancer (HCC) 2011   Unilateral primary osteoarthritis, right hip 04/23/2017    Past Surgical History:  Procedure Laterality Date   BIOPSY  09/30/2022   Procedure: BIOPSY;  Surgeon: Eda Iha, MD;  Location: [facility] ENDOSCOPY;  Service: Gastroenterology;;   COLONOSCOPY WITH PROPOFOL  N/A 10/01/2022   Procedure: COLONOSCOPY WITH PROPOFOL ;  Surgeon: Eda Iha, MD;  Location: [facility] ENDOSCOPY;  Service: Gastroenterology;  Laterality: N/A;   ESOPHAGOGASTRODUODENOSCOPY N/A 08/15/2013   Procedure: ESOPHAGOGASTRODUODENOSCOPY (EGD);  Surgeon: Toribio SHAUNNA Cedar, MD;  Location: [facility] ENDOSCOPY;  Service: Endoscopy;  Laterality: N/A;   ESOPHAGOGASTRODUODENOSCOPY (EGD) WITH PROPOFOL  N/A 09/30/2022   Procedure: ESOPHAGOGASTRODUODENOSCOPY (EGD) WITH PROPOFOL ;  Surgeon: Eda Iha, MD;  Location: [facility] ENDOSCOPY;  Service: Gastroenterology;  Laterality: N/A;   GIVENS CAPSULE STUDY  10/01/2022   Procedure: GIVENS CAPSULE STUDY;  Surgeon: Eda Iha, MD;  Location: [facility] ENDOSCOPY;  Service: Gastroenterology;;   INGUINAL HERNIA REPAIR Bilateral ~ 2004   ROBOT ASSISTED LAPAROSCOPIC RADICAL PROSTATECTOMY  08/2009   thelbert 08/14/2009 (08/14/2013)   TOTAL HIP ARTHROPLASTY Left 11/14/2013   Procedure: LEFT TOTAL HIP ARTHROPLASTY ANTERIOR APPROACH;  Surgeon: Lonni CINDERELLA Poli, MD;  Location: [facility] OR;  Service: Orthopedics;  Laterality: Left;   TOTAL HIP ARTHROPLASTY Right 04/23/2017   Procedure: RIGHT TOTAL HIP ARTHROPLASTY ANTERIOR APPROACH;  Surgeon: Poli Lonni CINDERELLA, MD;  Location: [facility] ORS;  Service: Orthopedics;  Laterality: Right;    Current Outpatient Medications  Medication Sig Dispense Refill   cyclobenzaprine  (FLEXERIL ) 10 MG tablet Take 1 tablet (10 mg total) by mouth 3 (three) times daily as needed for muscle spasms. 60 tablet 5   diclofenac  (VOLTAREN ) 75 MG EC tablet TAKE 1 TABLET BY MOUTH ONCE DAILY AS NEEDED FOR MILD  PAIN 30 tablet 5   ferrous sulfate  325 (65 FE) MG EC tablet Take 1 tablet (325 mg total) by mouth daily with breakfast. 30 tablet 0   irbesartan-hydrochlorothiazide (AVALIDE) 300-12.5 MG tablet Take 1 tablet by mouth daily.     Multiple Vitamins-Minerals (MULTIVITAMIN ADULT, MINERALS,) TABS 1 tablet Orally daily     pantoprazole  (PROTONIX ) 40 MG tablet Take 1 tablet (40 mg total) by mouth 2 (two) times daily before a meal. 60 tablet 1   simvastatin  (ZOCOR ) 40 MG tablet Take 40 mg by mouth daily.     No current facility-administered medications for this visit.    Allergies as of 05/11/2024   (No Known Allergies)    Family History  Problem Relation Age of Onset   Diabetes Mother    Breast cancer Mother    Diabetes Sister    Colon cancer Neg Hx    Stomach cancer Neg Hx    Esophageal cancer Neg Hx    Pancreatic cancer Neg Hx    Rectal cancer Neg Hx     Review of Systems:    Constitutional: No weight loss, fever, chills, weakness or fatigue HEENT: Eyes: No change in vision               Ears, Nose, Throat:  No change in hearing or  congestion Skin: No rash or itching Cardiovascular: No chest pain, chest pressure or palpitations   Respiratory: No SOB or cough Gastrointestinal: See HPI and otherwise negative Genitourinary: No dysuria or change in urinary frequency Neurological: No headache, dizziness or syncope Musculoskeletal: No new muscle or joint pain Hematologic: No bleeding or bruising Psychiatric: No history of depression or anxiety    Physical Exam:  Vital signs: BP 132/76 (BP Location: Left Arm, Patient Position: Sitting, Cuff Size: Large)   Pulse 83   Ht 5' 7 (1.702 m)   Wt 239 lb 2 oz (108.5 kg)   BMI 37.45 kg/m   Constitutional:   Pleasant A.A. male appears to be in NAD, Well developed, Well nourished, alert and cooperative Throat: Oral cavity and pharynx without inflammation, swelling or lesion.  Respiratory: Respirations even and unlabored. Lungs clear to  auscultation bilaterally.   No wheezes, crackles, or rhonchi.  Cardiovascular: Normal S1, S2. Regular rate and rhythm. No peripheral edema, cyanosis or pallor.  Gastrointestinal:  Soft, nondistended, nontender. No rebound or guarding. Normal bowel sounds. No appreciable masses or hepatomegaly. Rectal:  Not performed.  Msk:  Symmetrical without gross deformities. Without edema, no deformity or joint abnormality.  Neurologic:  Alert and  oriented x4;  grossly normal neurologically.  Skin:   Dry and intact without significant lesions or rashes.  RELEVANT LABS AND IMAGING: CBC    Latest Ref Rng & Units 05/11/2024    2:57 PM 04/05/2024    3:20 PM 03/16/2024    4:19 PM  CBC  WBC 4.0 - 10.5 K/uL 9.3  8.3  7.2   Hemoglobin 13.0 - 17.0 g/dL 9.3  9.9  8.8 Repeated and verified X2.   Hematocrit 39.0 - 52.0 % 30.0  32.4  27.6   Platelets 150.0 - 400.0 K/uL 403.0  410.0  422.0      CMP     Latest Ref Rng & Units 12/08/2023    2:13 PM 06/07/2023    8:46 AM 03/19/2023   11:58 AM  CMP  Glucose 70 - 99 mg/dL 877  94  86   BUN 8 - 23 mg/dL 31  20  22    Creatinine 0.61 - 1.24 mg/dL 8.83  8.89  8.93   Sodium 135 - 145 mmol/L 137  139  137   Potassium 3.5 - 5.1 mmol/L 4.1  4.6  4.3   Chloride 98 - 111 mmol/L 104  102  101   CO2 22 - 32 mmol/L 28  32  32   Calcium 8.9 - 10.3 mg/dL 9.3  9.5  9.4   Total Protein 6.5 - 8.1 g/dL 7.4  7.7  7.4   Total Bilirubin 0.0 - 1.2 mg/dL 0.8  0.4  0.6   Alkaline Phos 38 - 126 U/L 89  89  91   AST 15 - 41 U/L 17  19  20    ALT 0 - 44 U/L 18  21  25    11/26/22 h pylori stool antigen negative 12/28/23 labs show: hgb 13.8 02/01/24 labs show: iron 52, iron sat 13, ferritin 26, hgb 10.7  Assessment: Encounter Diagnoses  Name Primary?   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  Patient had presented back in August with reports of melena  and drop in hemoglobin. Hemoglobin was 12.8 in April, then 13.8 in [place] and dropped to 10.7 in July.  I repeated his hemoglobin in the office and it had dropped down to 8.8 therefore we went ahead and scheduled upper GI endoscopy.  EGD showed gastritis with biopsies positive for intestinal metaplasia.  History of H. pylori however biopsies and follow-up stool test were both negative.  Patient is taking daily oral iron.  Will go ahead and recheck his levels today and if his levels have not improved or continue to drop we will go ahead and proceed with small capsule endoscopy and do IV iron infusions.  Patient agrees to plan.  Will go ahead and continue PPI therapy twice daily.   Plan: -recheck CBC , iron panel, TIBC today -continue oral iron supplement po daily -No NSAID's -Continue Pantoprazole  40 mg po twice daily  -repeat upper endoscopy in 3 years for surveillance of gastric intestinal metaplasia.  - pending small capsule endoscopy if Hgb low -consider IV iron infusion - Follow-up with hematology as scheduled  Thank you for the courtesy of this consult. Please call me with any questions or concerns.   Demara Lover, FNP-C [facility] 05/11/2024, 4:26 PM  Cc: [name], MD

## 2024-05-11 NOTE — Progress Notes (Signed)
 ____________________________________________________________  Attending physician addendum:  Thank you for sending this case to me. I have reviewed the entire note and agree with the plan.  Hemoglobin down from 9.9 about 5 weeks ago to 9.3 with persistently low MCV.  Not clear if he still has GI blood loss from an obscure source and/or not absorbing oral iron well. He should have a video capsule study, and please communicate with his hematology team for some IV iron.  Victory Brand, MD  ____________________________________________________________

## 2024-05-11 NOTE — Patient Instructions (Signed)
 Your provider has requested that you go to the basement level for lab work before leaving today. Press B on the elevator. The lab is located at the first door on the left as you exit the elevator.  _______________________________________________________  If your blood pressure at your visit was 140/90 or greater, please contact your primary care physician to follow up on this.  _______________________________________________________  If you are age 76 or older, your body mass index should be between 23-30. Your Body mass index is 37.45 kg/m. If this is out of the aforementioned range listed, please consider follow up with your Primary Care Provider.  If you are age 89 or younger, your body mass index should be between 19-25. Your Body mass index is 37.45 kg/m. If this is out of the aformentioned range listed, please consider follow up with your Primary Care Provider.   ________________________________________________________  The [facility] providers would like to encourage you to use MYCHART to communicate with providers for non-urgent requests or questions.  Due to long hold times on the telephone, sending your provider a message by [facility] may be a faster and more efficient way to get a response.  Please allow 48 business hours for a response.  Please remember that this is for non-urgent requests.  _______________________________________________________  [facility] is using a team-based approach to care.  Your team is made up of your doctor and two to three APPS. Our APPS (Nurse Practitioners and Physician Assistants) work with your physician to ensure care continuity for you. They are fully qualified to address your health concerns and develop a treatment plan. They communicate directly with your gastroenterologist to care for you. Seeing the Advanced Practice Practitioners on your physician's team can help you by facilitating care more promptly, often allowing for earlier  appointments, access to diagnostic testing, procedures, and other specialty referrals.   Thank you for trusting me with your gastrointestinal care. Deanna May, FNP-C

## 2024-05-12 ENCOUNTER — Ambulatory Visit: Payer: Self-pay | Admitting: Gastroenterology

## 2024-05-12 ENCOUNTER — Telehealth: Payer: Self-pay

## 2024-05-12 ENCOUNTER — Other Ambulatory Visit: Payer: Self-pay

## 2024-05-12 DIAGNOSIS — D509 Iron deficiency anemia, unspecified: Secondary | ICD-10-CM

## 2024-05-12 NOTE — Telephone Encounter (Signed)
-----   Message from Cathryne PARAS May sent at 05/12/2024  7:24 AM EDT ----- Pod B Let patient know his Hgb blood levels stable but still low. We will go ahead and set him up for small capsule endoscopy in the office for iron deficiency. Also send referral back to his hematologist for iron deficiency and for patient to get iv iron.  Thank you,  Deanna, NP ----- Message ----- From: Legrand Victory LITTIE DOUGLAS, MD Sent: 05/11/2024   5:29 PM EDT To: Cathryne PARAS May, NP

## 2024-05-12 NOTE — Telephone Encounter (Signed)
 Spoke with patient's wife regarding results & recommendations. Amb ref placed for both hematology & capsule. Scheduled capsule for 05/30/24 at 8:30 am. Letter sent in [facility] & mailed home. Advised patient/wife to call back if they do not receive copy of instructions. Wife had no further questions & verbalized all understanding.

## 2024-05-19 ENCOUNTER — Other Ambulatory Visit: Payer: Self-pay

## 2024-05-19 ENCOUNTER — Telehealth: Payer: Self-pay

## 2024-05-19 DIAGNOSIS — D509 Iron deficiency anemia, unspecified: Secondary | ICD-10-CM

## 2024-05-19 NOTE — Telephone Encounter (Signed)
 See telephone encounter.

## 2024-05-25 ENCOUNTER — Other Ambulatory Visit: Payer: Self-pay

## 2024-05-25 ENCOUNTER — Other Ambulatory Visit

## 2024-05-25 ENCOUNTER — Telehealth: Payer: Self-pay | Admitting: Gastroenterology

## 2024-05-25 ENCOUNTER — Ambulatory Visit: Payer: Self-pay | Admitting: Gastroenterology

## 2024-05-25 DIAGNOSIS — D509 Iron deficiency anemia, unspecified: Secondary | ICD-10-CM | POA: Diagnosis not present

## 2024-05-25 LAB — CBC
HCT: 27.9 % — ABNORMAL LOW (ref 39.0–52.0)
Hemoglobin: 8.7 g/dL — ABNORMAL LOW (ref 13.0–17.0)
MCHC: 31.1 g/dL (ref 30.0–36.0)
MCV: 68.7 fl — ABNORMAL LOW (ref 78.0–100.0)
Platelets: 467 K/uL — ABNORMAL HIGH (ref 150.0–400.0)
RBC: 4.06 Mil/uL — ABNORMAL LOW (ref 4.22–5.81)
RDW: 20.3 % — ABNORMAL HIGH (ref 11.5–15.5)
WBC: 10.8 K/uL — ABNORMAL HIGH (ref 4.0–10.5)

## 2024-05-25 NOTE — Telephone Encounter (Signed)
 Inbound call from patients wife stating husband is not doing well, his iron is super low and he has fainted on Monday 05/22/24 due to his iron. Wife stated he's always cold and hasn't been himself, his is scheduled to get infusions 06/07/24 and she would like to know if he can have it sooner.  Please advise  Thank you

## 2024-05-25 NOTE — Telephone Encounter (Signed)
 Spoke with patient's wife & she says patient fainted, slid out of his chair on Monday 10/13. HR 80 & BP 127/72 at the time. He has felt very tired & cold ever since. Staying under blankets & heating pads. He is able to ambulate normally & no complaints of dizziness. He is scheduled for IV iron on 10/29. Capsule on 10/21 & EGD on 11/18.

## 2024-05-25 NOTE — Telephone Encounter (Signed)
 CBC ordered. Spoke with patient's wife & she will call hematology office for sooner appt, and bring patient by today for labs. Advised on when/where to go.

## 2024-05-28 DIAGNOSIS — M545 Low back pain, unspecified: Secondary | ICD-10-CM | POA: Diagnosis not present

## 2024-05-30 ENCOUNTER — Other Ambulatory Visit: Payer: Self-pay | Admitting: Physical Medicine and Rehabilitation

## 2024-05-30 ENCOUNTER — Ambulatory Visit (INDEPENDENT_AMBULATORY_CARE_PROVIDER_SITE_OTHER): Admitting: Gastroenterology

## 2024-05-30 DIAGNOSIS — M7061 Trochanteric bursitis, right hip: Secondary | ICD-10-CM

## 2024-05-30 DIAGNOSIS — K31819 Angiodysplasia of stomach and duodenum without bleeding: Secondary | ICD-10-CM | POA: Diagnosis not present

## 2024-05-30 DIAGNOSIS — D500 Iron deficiency anemia secondary to blood loss (chronic): Secondary | ICD-10-CM

## 2024-05-30 DIAGNOSIS — D509 Iron deficiency anemia, unspecified: Secondary | ICD-10-CM

## 2024-05-30 NOTE — Patient Instructions (Addendum)
POST CAPSULE INSTRUCTIONS:   Contact our office immediately at [phone] if you suffer from any abdominal pain, nausea, or vomiting during capsule endoscopy. Do not eat or drink for at least 2 hours. After 2 hours you may have any of the following to drink: Water                           White grape juice 7-Up                            Chicken Bouillon Sprite                           Ginger Ale  After 4 hours you may have a light snack to include any of the following: A cup of soup               sandwich Bowl of cereal             Rice Toast                           Eggs 2-3 small cookies (i.e. vanilla wafers or graham crackers)  Return to this office at 4:00 pm. After this you will be able to return to your normal diet.  During your procedure do not go near anyone else that is having capsule endoscopy.  Do not be in close contact with an MRI machine or a radio or television tower.   Do not wear a heavy coat or sweater because your recorder may over heat and stop recording.   Do not disconnect the equipment or remove the belt at any time.  Since the Data Recorder is actually a small computer, it should be treated with utmost care and protection.  Avoid sudden movement and banging of the Data Recorder.    Do not do any heavy lifting or strenuous physical activity during the test especially if it involves sweating.  Do not bend over or stoop during capsule endoscopy.  During capsule endoscopy, you will need to verify every 15 minutes that the small light on top of the Data Recorder is blinking twice per second.  If for some reason it stops blinking, record the time and contact our office at [phone].                     

## 2024-05-30 NOTE — Progress Notes (Signed)
  Exp: 2024-11-16 LOT: [id number]  Patient arrived for capsule endoscopy. Reported the prep went well. Confirmed patient is fasting. Explained dietary restrictions for the next few hours. Patient verbalized understanding. Opened capsule, ensured capsule was flashing and transmitting to the recorder prior to the patient swallowing the capsule. Patient swallowed capsule without difficulty. Patient told to call the office with any questions. Understands to return to the office today between 4:00 and 4:30 pm. No further questions by the conclusion of the visit.

## 2024-06-01 NOTE — Telephone Encounter (Signed)
-----   Message from Cathryne PARAS May sent at 05/31/2024 11:47 AM EDT ----- Karna- Please let pt know below.  Cathryne, NP ----- Message ----- From: Interface, Lab In Three Zero One Sent: 05/25/2024   3:31 PM EDT To: Cathryne PARAS May, NP

## 2024-06-04 ENCOUNTER — Other Ambulatory Visit: Payer: Self-pay

## 2024-06-04 ENCOUNTER — Emergency Department (HOSPITAL_COMMUNITY)

## 2024-06-04 ENCOUNTER — Emergency Department (HOSPITAL_COMMUNITY)
Admission: EM | Admit: 2024-06-04 | Discharge: 2024-06-04 | Disposition: A | Discharge status: Home / Self Care | Attending: Emergency Medicine | Admitting: Emergency Medicine

## 2024-06-04 ENCOUNTER — Encounter (HOSPITAL_COMMUNITY): Payer: Self-pay | Admitting: Pharmacy Technician

## 2024-06-04 DIAGNOSIS — M79641 Pain in right hand: Secondary | ICD-10-CM | POA: Diagnosis present

## 2024-06-04 DIAGNOSIS — Z79899 Other long term (current) drug therapy: Secondary | ICD-10-CM | POA: Insufficient documentation

## 2024-06-04 DIAGNOSIS — I10 Essential (primary) hypertension: Secondary | ICD-10-CM | POA: Insufficient documentation

## 2024-06-04 DIAGNOSIS — R011 Cardiac murmur, unspecified: Secondary | ICD-10-CM | POA: Insufficient documentation

## 2024-06-04 DIAGNOSIS — M19041 Primary osteoarthritis, right hand: Secondary | ICD-10-CM | POA: Diagnosis not present

## 2024-06-04 DIAGNOSIS — M19042 Primary osteoarthritis, left hand: Secondary | ICD-10-CM | POA: Diagnosis not present

## 2024-06-04 DIAGNOSIS — M79642 Pain in left hand: Secondary | ICD-10-CM

## 2024-06-04 LAB — BASIC METABOLIC PANEL WITH GFR
Anion gap: 12 (ref 5–15)
BUN: 28 mg/dL — ABNORMAL HIGH (ref 8–23)
CO2: 25 mmol/L (ref 22–32)
Calcium: 9.3 mg/dL (ref 8.9–10.3)
Chloride: 97 mmol/L — ABNORMAL LOW (ref 98–111)
Creatinine, Ser: 1.36 mg/dL — ABNORMAL HIGH (ref 0.61–1.24)
GFR, Estimated: 54 mL/min — ABNORMAL LOW (ref >60)
Glucose, Bld: 92 mg/dL (ref 70–99)
Potassium: 4.1 mmol/L (ref 3.5–5.1)
Sodium: 134 mmol/L — ABNORMAL LOW (ref 135–145)

## 2024-06-04 LAB — CBC
HCT: 30.6 % — ABNORMAL LOW (ref 39.0–52.0)
Hemoglobin: 9.2 g/dL — ABNORMAL LOW (ref 13.0–17.0)
MCH: 21.4 pg — ABNORMAL LOW (ref 26.0–34.0)
MCHC: 30.1 g/dL (ref 30.0–36.0)
MCV: 71.3 fL — ABNORMAL LOW (ref 80.0–100.0)
Platelets: 465 K/uL — ABNORMAL HIGH (ref 150–400)
RBC: 4.29 MIL/uL (ref 4.22–5.81)
RDW: 21 % — ABNORMAL HIGH (ref 11.5–15.5)
WBC: 13.7 K/uL — ABNORMAL HIGH (ref 4.0–10.5)
nRBC: 0 % (ref 0.0–0.2)

## 2024-06-04 MED ORDER — ACETAMINOPHEN 500 MG PO TABS
1000.0000 mg | ORAL_TABLET | Freq: Once | ORAL | Status: AC
Start: 2024-06-04 — End: 2024-06-04
  Administered 2024-06-04: 1000 mg via ORAL
  Filled 2024-06-04: qty 2

## 2024-06-04 MED ORDER — OXYCODONE HCL 5 MG PO TABS
5.0000 mg | ORAL_TABLET | Freq: Once | ORAL | Status: AC
  Administered 2024-06-04: 5 mg via ORAL
  Filled 2024-06-04: qty 1

## 2024-06-04 MED ORDER — HYDROCODONE-ACETAMINOPHEN 5-325 MG PO TABS: 1.0000 | ORAL_TABLET | ORAL | 0 refills | Status: AC | PRN

## 2024-06-04 MED ORDER — DICLOFENAC SODIUM 1 % EX GEL: 2.0000 g | Freq: Four times a day (QID) | CUTANEOUS | 0 refills | Status: AC

## 2024-06-04 NOTE — ED Provider Triage Note (Signed)
 Emergency Medicine Provider Triage Evaluation Note  Lance Rollins , a 78 y.o. male  was evaluated in triage.  Pt complains of bilateral hand pain/swelling/numbness.  Symptoms have been ongoing for approximately 1 week, symptoms seem to ease up but then worsened Friday night, he has been taking tramadol  for pain without much relief. Denies fever, no history of arthritis.  His wife is concerned that the symptoms may be related to the fact that he is iron deficient, as he is scheduled for an iron infusion next week.  Review of Systems  Positive: As above Negative: As above  Physical Exam  BP (!) 145/98 (BP Location: Right Arm)   Pulse (!) 103   Temp 98.4 F (36.9 C) (Oral)   Resp 18   SpO2 95%  Gen:   Awake, no distress   Resp:  Normal effort  MSK:   Moves extremities without difficulty  Other:  Mild swelling noted to dorsal aspect of both hands, no appreciable warmth/erythema, grip strength is diminished secondary to pain  Medical Decision Making  Medically screening exam initiated at 1:16 PM.  Appropriate orders placed.  Lance Rollins was informed that the remainder of the evaluation will be completed by another provider, this initial triage assessment does not replace that evaluation, and the importance of remaining in the ED until their evaluation is complete.     Lance Rollins SAILOR, [place] 06/04/24 1317

## 2024-06-04 NOTE — Discharge Instructions (Addendum)
 You were seen today for hand pain. While you were here we monitored your vitals, preformed a physical exam, and x-rays. These were all reassuring and there is no indication for any further testing or intervention in the emergency department at this time.   Things to do:  - Follow up with your primary care provider within the next 1-2 weeks - Take hydrocodone  every 6 hours as needed for hand pain - Apply Voltaren  gel to your hands up to 4 times a day as needed for pain  Return to the emergency department if you have any new or worsening symptoms including  difficulty using her hands, fevers, redness, or if you have any other concerns.

## 2024-06-04 NOTE — ED Provider Notes (Signed)
 [facility] Provider Note   CSN: [id number] Arrival date & time: 06/04/24  1219     Patient presents with: Hand Pain and Numbness   Lance Rollins is a 78 y.o. male past medical history of MGUS, iron deficiency anemia in the setting of gastritis, hypertension, and occult GI bleed presenting to the emergency department for evaluation of bilateral hand pain.  Patient reports he has had intermittent numbness and tingling over his hands with increasing pain over the past week.  This worsened this week and his appointment patient is being woken from sleep.  Continue tramadol  at home without relief.  Patient ports he is scheduled for an iron transfusion this week and is following with hematology for further evaluation of his MGUS.  He denies fevers, chills, shortness of breath, redness of his hands or any other joint pain.  {Add pertinent medical, surgical, social history, OB history to HPI:32947}  Hand Pain       Prior to Admission medications   Medication Sig Start Date End Date Taking? Authorizing Provider  cyclobenzaprine  (FLEXERIL ) 10 MG tablet Take 1 tablet (10 mg total) by mouth 3 (three) times daily as needed for muscle spasms. 04/12/24   Lovorn, Megan, MD  diclofenac  (VOLTAREN ) 75 MG EC tablet TAKE 1 TABLET BY MOUTH ONCE DAILY AS NEEDED FOR MILD PAIN WITH FOOD 05/30/24   Lovorn, Megan, MD  ferrous sulfate  325 (65 FE) MG EC tablet Take 1 tablet (325 mg total) by mouth daily with breakfast. 10/02/22 05/11/24  Cheryle Page, MD  irbesartan-hydrochlorothiazide (AVALIDE) 300-12.5 MG tablet Take 1 tablet by mouth daily. 11/03/19   [provider]  Multiple Vitamins-Minerals (MULTIVITAMIN ADULT, MINERALS,) TABS 1 tablet Orally daily    [provider]  pantoprazole  (PROTONIX ) 40 MG tablet Take 1 tablet (40 mg total) by mouth 2 (two) times daily before a meal. 03/16/24 05/11/24  May, Deanna J, NP  simvastatin  (ZOCOR ) 40 MG tablet Take 40  mg by mouth daily. 08/09/19   [provider]    Allergies: Patient has no known allergies.    Review of Systems  Updated Vital Signs BP (!) 145/98 (BP Location: Right Arm)   Pulse (!) 103   Temp 98.4 F (36.9 C) (Oral)   Resp 18   SpO2 95%   Physical Exam  (all labs ordered are listed, but only abnormal results are displayed) Labs Reviewed  CBC - Abnormal; Notable for the following components:      Result Value   WBC 13.7 (*)    Hemoglobin 9.2 (*)    HCT 30.6 (*)    MCV 71.3 (*)    MCH 21.4 (*)    RDW 21.0 (*)    Platelets 465 (*)    All other components within normal limits  BASIC METABOLIC PANEL WITH GFR - Abnormal; Notable for the following components:   Sodium 134 (*)    Chloride 97 (*)    BUN 28 (*)    Creatinine, Ser 1.36 (*)    GFR, Estimated 54 (*)    All other components within normal limits    EKG: None  Radiology: No results found.  {Document cardiac monitor, telemetry assessment procedure when appropriate:32947} Procedures   Medications Ordered in the ED - No data to display    {Click here for ABCD2, HEART and other calculators REFRESH Note before signing:1}  Medical Decision Making Amount and/or Complexity of Data Reviewed Radiology: ordered.  Risk OTC drugs.   ***  {Document critical care time when appropriate  Document review of labs and clinical decision tools ie CHADS2VASC2, etc  Document your independent review of radiology images and any outside records  Document your discussion with family members, caretakers and with consultants  Document social determinants of health affecting pt's care  Document your decision making why or why not admission, treatments were needed:32947:::1}   Final diagnoses:  None    ED Discharge Orders     None

## 2024-06-04 NOTE — ED Triage Notes (Signed)
 PT arrives via POV. PT reports he has been experiencing pain and numbness to both hands since last Monday. States the pain has worsened over the weekend. No focal neurological deficits noted.

## 2024-06-07 ENCOUNTER — Ambulatory Visit: Payer: Self-pay | Admitting: Cardiology

## 2024-06-07 ENCOUNTER — Ambulatory Visit (HOSPITAL_COMMUNITY)
Admission: RE | Admit: 2024-06-07 | Discharge: 2024-06-07 | Disposition: A | Discharge status: Home / Self Care | Source: Ambulatory Visit | Attending: Cardiology | Admitting: Cardiology

## 2024-06-07 ENCOUNTER — Inpatient Hospital Stay: Attending: Internal Medicine

## 2024-06-07 ENCOUNTER — Ambulatory Visit (HOSPITAL_BASED_OUTPATIENT_CLINIC_OR_DEPARTMENT_OTHER)
Admission: RE | Admit: 2024-06-07 | Discharge: 2024-06-07 | Disposition: A | Discharge status: Home / Self Care | Source: Ambulatory Visit | Attending: Cardiology | Admitting: Cardiology

## 2024-06-07 DIAGNOSIS — E7800 Pure hypercholesterolemia, unspecified: Secondary | ICD-10-CM | POA: Insufficient documentation

## 2024-06-07 DIAGNOSIS — R011 Cardiac murmur, unspecified: Secondary | ICD-10-CM | POA: Insufficient documentation

## 2024-06-07 DIAGNOSIS — R0609 Other forms of dyspnea: Secondary | ICD-10-CM | POA: Diagnosis not present

## 2024-06-07 DIAGNOSIS — I10 Essential (primary) hypertension: Secondary | ICD-10-CM | POA: Diagnosis not present

## 2024-06-07 DIAGNOSIS — R600 Localized edema: Secondary | ICD-10-CM

## 2024-06-07 LAB — ECHOCARDIOGRAM COMPLETE
AR max vel: 1.39 cm2
AV Area VTI: 1.54 cm2
AV Area mean vel: 1.32 cm2
AV Mean grad: 25.3 mmHg
AV Peak grad: 42.9 mmHg
Ao pk vel: 3.28 m/s
Area-P 1/2: 2.81 cm2

## 2024-06-12 ENCOUNTER — Ambulatory Visit: Admitting: Orthopedic Surgery

## 2024-06-12 ENCOUNTER — Encounter: Payer: Self-pay | Admitting: Radiology

## 2024-06-14 ENCOUNTER — Telehealth: Payer: Self-pay | Admitting: Pharmacy Technician

## 2024-06-14 ENCOUNTER — Inpatient Hospital Stay: Attending: Internal Medicine | Admitting: Internal Medicine

## 2024-06-14 VITALS — BP 125/66 | HR 85 | Temp 97.6°F | Resp 17 | Ht 67.0 in | Wt 240.0 lb

## 2024-06-14 DIAGNOSIS — D500 Iron deficiency anemia secondary to blood loss (chronic): Secondary | ICD-10-CM | POA: Diagnosis not present

## 2024-06-14 DIAGNOSIS — D509 Iron deficiency anemia, unspecified: Secondary | ICD-10-CM | POA: Diagnosis present

## 2024-06-14 DIAGNOSIS — D472 Monoclonal gammopathy: Secondary | ICD-10-CM | POA: Diagnosis not present

## 2024-06-14 DIAGNOSIS — Z79899 Other long term (current) drug therapy: Secondary | ICD-10-CM | POA: Diagnosis not present

## 2024-06-14 NOTE — Progress Notes (Signed)
 [facility] [phone]   [phone]  OFFICE PROGRESS NOTE  Lance Carlin Redbird, MD [address] 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 2) monoclonal gammopathy of undetermined significance.  PRIOR THERAPY: Iron infusion with Venofer 300 mg IV weekly for 3 weeks on as needed basis.  Next treatment June 16, 2024.  CURRENT THERAPY: Over-the-counter oral iron tablet with vitamin C.  INTERVAL HISTORY: Lance Rollins 78 y.o. male returns to the clinic today for follow-up visit accompanied by his wife.Discussed the use of AI scribe software for clinical note transcription with the patient, who gave verbal consent to proceed.  History of Present Illness Lance Rollins is a 78 year old male with iron deficiency anemia secondary to gastritis who presents for evaluation and repeat blood work. He is accompanied by his wife.  Approximately two weeks ago, he experienced a fainting episode while sitting in a chair, losing consciousness during a conversation with his wife. He denies recent bleeding, including melena, epistaxis, or gum bleeding. He frequently feels cold and uses a topical application to stay warm.  His hemoglobin levels have decreased from 12.5 to 8.7 as of October 16, with the most recent lab work on October 26 showing a hemoglobin level of 9.2 and low iron levels. He is currently taking iron supplements, with a reduced dose from two to one as recommended by his gastroenterologist. His last iron infusion was last year at [facility].  He has a history of monoclonal gammopathy of undetermined significance. He is scheduled to see a rheumatologist due to swelling and aching in his hands. His body has 'frozen up' three times, taking all day to loosen. No dizzy spells, chest pain, dyspnea, nausea, vomiting, or diarrhea.      MEDICAL HISTORY: Past Medical History:  Diagnosis Date   Arthritis     Bleeding per rectum 08/2013   black and bright red (08/14/2013)   Hypercholesteremia    Hypertension    Obesity    Prostate cancer (HCC) 2011   Unilateral primary osteoarthritis, right hip 04/23/2017    ALLERGIES:  has no known allergies.  MEDICATIONS:  Current Outpatient Medications  Medication Sig Dispense Refill   cyclobenzaprine  (FLEXERIL ) 10 MG tablet Take 1 tablet (10 mg total) by mouth 3 (three) times daily as needed for muscle spasms. 60 tablet 5   diclofenac  (VOLTAREN ) 75 MG EC tablet TAKE 1 TABLET BY MOUTH ONCE DAILY AS NEEDED FOR MILD PAIN WITH FOOD 30 tablet 5   diclofenac  Sodium (VOLTAREN  ARTHRITIS PAIN) 1 % GEL Apply 2 g topically 4 (four) times daily. 100 g 0   ferrous sulfate  325 (65 FE) MG EC tablet Take 1 tablet (325 mg total) by mouth daily with breakfast. 30 tablet 0   irbesartan-hydrochlorothiazide (AVALIDE) 300-12.5 MG tablet Take 1 tablet by mouth daily.     Multiple Vitamins-Minerals (MULTIVITAMIN ADULT, MINERALS,) TABS 1 tablet Orally daily     pantoprazole  (PROTONIX ) 40 MG tablet Take 1 tablet (40 mg total) by mouth 2 (two) times daily before a meal. 60 tablet 1   simvastatin  (ZOCOR ) 40 MG tablet Take 40 mg by mouth daily.     No current facility-administered medications for this visit.    SURGICAL HISTORY:  Past Surgical History:  Procedure Laterality Date   BIOPSY  09/30/2022   Procedure: BIOPSY;  Surgeon: Eda Iha, MD;  Location: [facility] ENDOSCOPY;  Service: Gastroenterology;;   COLONOSCOPY WITH PROPOFOL  N/A  10/01/2022   Procedure: COLONOSCOPY WITH PROPOFOL ;  Surgeon: Eda Iha, MD;  Location: [facility] ENDOSCOPY;  Service: Gastroenterology;  Laterality: N/A;   ESOPHAGOGASTRODUODENOSCOPY N/A 08/15/2013   Procedure: ESOPHAGOGASTRODUODENOSCOPY (EGD);  Surgeon: Toribio SHAUNNA Cedar, MD;  Location: [facility] ENDOSCOPY;  Service: Endoscopy;  Laterality: N/A;   ESOPHAGOGASTRODUODENOSCOPY (EGD) WITH PROPOFOL  N/A 09/30/2022   Procedure: ESOPHAGOGASTRODUODENOSCOPY (EGD)  WITH PROPOFOL ;  Surgeon: Eda Iha, MD;  Location: [facility] ENDOSCOPY;  Service: Gastroenterology;  Laterality: N/A;   GIVENS CAPSULE STUDY  10/01/2022   Procedure: GIVENS CAPSULE STUDY;  Surgeon: Eda Iha, MD;  Location: [facility] ENDOSCOPY;  Service: Gastroenterology;;   INGUINAL HERNIA REPAIR Bilateral ~ 2004   ROBOT ASSISTED LAPAROSCOPIC RADICAL PROSTATECTOMY  08/2009   thelbert 08/14/2009 (08/14/2013)   TOTAL HIP ARTHROPLASTY Left 11/14/2013   Procedure: LEFT TOTAL HIP ARTHROPLASTY ANTERIOR APPROACH;  Surgeon: Lonni CINDERELLA Poli, MD;  Location: [facility] OR;  Service: Orthopedics;  Laterality: Left;   TOTAL HIP ARTHROPLASTY Right 04/23/2017   Procedure: RIGHT TOTAL HIP ARTHROPLASTY ANTERIOR APPROACH;  Surgeon: Poli Lonni CINDERELLA, MD;  Location: [facility] ORS;  Service: Orthopedics;  Laterality: Right;    REVIEW OF SYSTEMS:  A comprehensive review of systems was negative except for: Constitutional: positive for fatigue Neurological: positive for dizziness   PHYSICAL EXAMINATION: General appearance: alert, cooperative, and no distress Head: Normocephalic, without obvious abnormality, atraumatic Neck: no adenopathy, no JVD, supple, symmetrical, trachea midline, and thyroid not enlarged, symmetric, no tenderness/mass/nodules Lymph nodes: Cervical, supraclavicular, and axillary nodes normal. Resp: clear to auscultation bilaterally Back: symmetric, no curvature. ROM normal. No CVA tenderness. Cardio: regular rate and rhythm, S1, S2 normal, no murmur, click, rub or gallop GI: soft, non-tender; bowel sounds normal; no masses,  no organomegaly Extremities: extremities normal, atraumatic, no cyanosis or edema  ECOG PERFORMANCE STATUS: 0 - Asymptomatic  Temperature 97.6 F (36.4 C), temperature source Temporal, resp. rate 17, height 5' 7 (1.702 m), weight 240 lb (108.9 kg).  LABORATORY DATA: Lab Results  Component Value Date   WBC 13.7 (H) 06/04/2024   HGB 9.2 (L) 06/04/2024   HCT 30.6 (L)  06/04/2024   MCV 71.3 (L) 06/04/2024   PLT 465 (H) 06/04/2024      Chemistry      Component Value Date/Time   NA 134 (L) 06/04/2024 1318   K 4.1 06/04/2024 1318   CL 97 (L) 06/04/2024 1318   CO2 25 06/04/2024 1318   BUN 28 (H) 06/04/2024 1318   CREATININE 1.36 (H) 06/04/2024 1318   CREATININE 1.16 12/08/2023 1413      Component Value Date/Time   CALCIUM 9.3 06/04/2024 1318   ALKPHOS 89 12/08/2023 1413   AST 17 12/08/2023 1413   ALT 18 12/08/2023 1413   BILITOT 0.8 12/08/2023 1413       RADIOGRAPHIC STUDIES: VAS US  LOWER EXTREMITY VENOUS REFLUX Result Date: 06/08/2024  Lower Venous Reflux Study Patient Name:  Carvell Hoeffner  Date of Exam:   06/07/2024 Medical Rec #: [id number]     Accession #:    [id number] Date of Birth: August 31, 1945     Patient Gender: M Patient Age:   74 years Exam Location:  [address] Procedure:      VAS US  LOWER EXTREMITY VENOUS REFLUX Referring Phys: DAVID HARDING --------------------------------------------------------------------------------  Indications: Swelling.  Performing Technologist: Duwaine Hives RVS  Examination Guidelines: A complete evaluation includes B-mode imaging, spectral Doppler, color Doppler, and power Doppler as needed of all accessible portions of each vessel. Bilateral testing is considered an integral part of a complete  examination. Limited examinations for reoccurring indications may be performed as noted. The reflux portion of the exam is performed with the patient in reverse Trendelenburg. Significant venous reflux is defined as >500 ms in the superficial venous system, and >1 second in the deep venous system.  Venous Reflux Times +--------------+---------+------+-----------+------------+--------+ RIGHT         Reflux NoRefluxReflux TimeDiameter cmsComments                         Yes                                  +--------------+---------+------+-----------+------------+--------+ CFV                     yes   >1  second                      +--------------+---------+------+-----------+------------+--------+ FV mid        no                                             +--------------+---------+------+-----------+------------+--------+ Popliteal     no                                             +--------------+---------+------+-----------+------------+--------+ GSV at SFJ    no                            .43              +--------------+---------+------+-----------+------------+--------+ GSV prox thighno                            .13              +--------------+---------+------+-----------+------------+--------+ GSV mid thigh no                            .11              +--------------+---------+------+-----------+------------+--------+ GSV dist thigh                                      NWV      +--------------+---------+------+-----------+------------+--------+ GSV at knee   no                            .20              +--------------+---------+------+-----------+------------+--------+ GSV prox calf no                            .19              +--------------+---------+------+-----------+------------+--------+ SSV at [facility]    no                            .23              +--------------+---------+------+-----------+------------+--------+  SSV prox calf no                            .22              +--------------+---------+------+-----------+------------+--------+  +--------------+---------+------+-----------+------------+-------------+ LEFT          Reflux NoRefluxReflux TimeDiameter cmsComments                              Yes                                       +--------------+---------+------+-----------+------------+-------------+ CFV                     yes   >1 second                           +--------------+---------+------+-----------+------------+-------------+ FV mid        no                                                   +--------------+---------+------+-----------+------------+-------------+ Popliteal     no                                                  +--------------+---------+------+-----------+------------+-------------+ GSV at SFJ              yes    >500 ms      .57                   +--------------+---------+------+-----------+------------+-------------+ GSV prox thigh          yes    >500 ms      .45                   +--------------+---------+------+-----------+------------+-------------+ GSV mid thigh no                            .32                   +--------------+---------+------+-----------+------------+-------------+ GSV dist thighno                            .25     out of fascia +--------------+---------+------+-----------+------------+-------------+ GSV at knee   no                            .27                   +--------------+---------+------+-----------+------------+-------------+ GSV prox calf no                            .19                   +--------------+---------+------+-----------+------------+-------------+ SSV at [facility]    no                            .  21                   +--------------+---------+------+-----------+------------+-------------+ SSV prox calf no                            .18                   +--------------+---------+------+-----------+------------+-------------+   Summary: Right: - No evidence of deep vein thrombosis seen in the right lower extremity, from the common femoral through the popliteal veins. - No evidence of superficial venous thrombosis in the right lower extremity. - Venous reflux is noted in the right common femoral vein.  Left: - Venous reflux is noted in the left common femoral vein. - Venous reflux is noted in the left sapheno-femoral junction. - Venous reflux is noted in the left greater saphenous vein in the thigh.  *See table(s) above for measurements and observations.  Electronically signed by Deatrice Cage MD on 06/08/2024 at 1:10:17 PM.    Final    ECHOCARDIOGRAM COMPLETE Result Date: 06/07/2024    ECHOCARDIOGRAM REPORT   Patient Name:   SHAUNAK KREIS    Date of Exam: 06/07/2024 Medical Rec #:  [id number]       Height:       67.0 in Accession #:    [id number]      Weight:       239.1 lb Date of Birth:  May 02, 1946       BSA:          2.182 m Patient Age:    78 years        BP:           132/76 mmHg Patient Gender: M               HR:           92 bpm. Exam Location:  [address] Procedure: 2D Echo, 3D Echo, Cardiac Doppler, Color Doppler and Strain Analysis            (Both Spectral and Color Flow Doppler were utilized during            procedure). Indications:    R01.1 Murmur; R06.9 DOE; R60.0 Lower extremity edema  History:        Patient has no prior history of Echocardiogram examinations.                 Signs/Symptoms:Murmur, Dyspnea and Edema; Risk                 Factors:Dyslipidemia, Hypertension and Non-Smoker. Patient                 denies chest pain. He does have DOE with leg edema. Provider                 heard a murmur.  Sonographer:    Annabella Cater RVT, RDCS (AE), RDMS Referring Phys: [id number] DAVID W [facility]  Sonographer Comments: Suboptimal parasternal window, suboptimal subcostal window and patient is obese. IMPRESSIONS  1. Left ventricular ejection fraction, by estimation, is 60 to 65%. Left ventricular ejection fraction by 3D volume is 61 %. The left ventricle has normal function. The left ventricle has no regional wall motion abnormalities. Left ventricular diastolic  parameters are consistent with Grade I diastolic dysfunction (impaired relaxation).  2. Right ventricular systolic function is normal. The right ventricular size is normal.  3. The mitral valve is normal in structure. No  evidence of mitral valve regurgitation. No evidence of mitral stenosis.  4. The aortic valve has an indeterminant number of cusps. There is moderate calcification of the  aortic valve. There is moderate thickening of the aortic valve. Aortic valve regurgitation is not visualized. Moderate aortic valve stenosis. Aortic valve area, by VTI measures 1.54 cm. Aortic valve mean gradient measures 25.3 mmHg. Aortic valve Vmax measures 3.28 m/s.  5. The inferior vena cava is normal in size with greater than 50% respiratory variability, suggesting right atrial pressure of 3 mmHg. FINDINGS  Left Ventricle: Left ventricular ejection fraction, by estimation, is 60 to 65%. Left ventricular ejection fraction by 3D volume is 61 %. The left ventricle has normal function. The left ventricle has no regional wall motion abnormalities. Global longitudinal strain performed but not reported based on interpreter judgement due to suboptimal tracking. The left ventricular internal cavity size was normal in size. There is no left ventricular hypertrophy. Left ventricular diastolic parameters are consistent with Grade I diastolic dysfunction (impaired relaxation). Right Ventricle: The right ventricular size is normal. No increase in right ventricular wall thickness. Right ventricular systolic function is normal. Left Atrium: Left atrial size was normal in size. Right Atrium: Right atrial size was normal in size. Pericardium: There is no evidence of pericardial effusion. Mitral Valve: The mitral valve is normal in structure. No evidence of mitral valve regurgitation. No evidence of mitral valve stenosis. Tricuspid Valve: The tricuspid valve is normal in structure. Tricuspid valve regurgitation is not demonstrated. No evidence of tricuspid stenosis. Aortic Valve: The aortic valve has an indeterminant number of cusps. There is moderate calcification of the aortic valve. There is moderate thickening of the aortic valve. Aortic valve regurgitation is not visualized. Moderate aortic stenosis is present.  Aortic valve mean gradient measures 25.3 mmHg. Aortic valve peak gradient measures 42.9 mmHg. Aortic valve area,  by VTI measures 1.54 cm. Pulmonic Valve: The pulmonic valve was normal in structure. Pulmonic valve regurgitation is not visualized. No evidence of pulmonic stenosis. Aorta: The aortic root is normal in size and structure. Venous: The inferior vena cava is normal in size with greater than 50% respiratory variability, suggesting right atrial pressure of 3 mmHg. IAS/Shunts: No atrial level shunt detected by color flow Doppler. Additional Comments: 3D was performed not requiring image post processing on an independent workstation and was normal.  LEFT VENTRICLE PLAX 2D LVOT diam:     2.11 cm         Diastology LV SV:         108             LV e' medial:    7.10 cm/s LV SV Index:   50              LV E/e' medial:  13.4 LVOT Area:     3.50 cm        LV e' lateral:   6.83 cm/s                                LV E/e' lateral: 13.9                                 3D Volume EF  LV 3D EF:    Left                                             ventricul                                             ar                                             ejection                                             fraction                                             by 3D                                             volume is                                             61 %.                                 3D Volume EF:                                3D EF:        61 %                                LV EDV:       137 ml                                LV ESV:       53 ml                                LV SV:        84 ml RIGHT VENTRICLE RV S prime:     17.10 cm/s  PULMONARY VEINS TAPSE (M-mode): 2.2 cm      Diastolic Velocity: 43.70 cm/s                             S/D Velocity:       1.40  Systolic Velocity:  60.70 cm/s LEFT ATRIUM             Index        RIGHT ATRIUM           Index LA Vol (A2C):   65.6 ml 30.07 ml/m  RA Area:     18.40 cm LA Vol (A4C):   55.8 ml 25.58 ml/m  RA Volume:    54.10 ml  24.80 ml/m LA Biplane Vol: 61.4 ml 28.14 ml/m  AORTIC VALVE                     PULMONIC VALVE AV Area (Vmax):    1.39 cm      PV Vmax:       1.16 m/s AV Area (Vmean):   1.32 cm      PV Peak grad:  5.4 mmHg AV Area (VTI):     1.54 cm AV Vmax:           327.67 cm/s AV Vmean:          236.333 cm/s AV VTI:            0.704 m AV Peak Grad:      42.9 mmHg AV Mean Grad:      25.3 mmHg LVOT Vmax:         130.00 cm/s LVOT Vmean:        89.500 cm/s LVOT VTI:          0.310 m LVOT/AV VTI ratio: 0.44  AORTA Ao Root diam: 3.10 cm Ao Asc diam:  3.85 cm Ao Arch diam: 3.3 cm MITRAL VALVE MV Area (PHT): 2.81 cm     SHUNTS MV Decel Time: 270 msec     Systemic VTI:  0.31 m MV E velocity: 95.00 cm/s   Systemic Diam: 2.11 cm MV A velocity: 133.00 cm/s MV E/A ratio:  0.71 Oneil Parchment MD Electronically signed by Oneil Parchment MD Signature Date/Time: 06/07/2024/9:33:51 AM    Final    DG Hand 2 View Right Result Date: 06/04/2024 CLINICAL DATA:  Pain with range of motion. EXAM: RIGHT HAND - 2 VIEW COMPARISON:  None Available. FINDINGS: There is no evidence of fracture or dislocation. Multifocal osteoarthritis, prominent involving the thumb carpal metacarpal joint and third digit metacarpal phalangeal joint. Lesser involvement of the distal interphalangeal joint and remaining metacarpal phalangeal joints. Osseous projection from the distal third metacarpal may be a large osteophyte or sequela of remote injury. No erosions or bony destructive change. Mild dorsal soft tissue edema. IMPRESSION: Multifocal osteoarthritis, prominent involving the thumb carpometacarpal joint and third digit metacarpophalangeal joint. Electronically Signed   By: Andrea Gasman M.D.   On: 06/04/2024 16:53   DG Hand 2 View Left Result Date: 06/04/2024 CLINICAL DATA:  Bilateral hand pain with range of motion. EXAM: LEFT HAND - 2 VIEW COMPARISON:  None Available. FINDINGS: There is no evidence of fracture or dislocation. Multifocal  osteoarthritis, prominent involving the thumb carpal metacarpal joint. Lesser osteoarthritis of the distal interphalangeal joints and metacarpal phalangeal joints. No erosions or bony destructive change. Mild dorsal soft tissue edema. IMPRESSION: Multifocal osteoarthritis, prominent involving the thumb carpometacarpal joint. Electronically Signed   By: Andrea Gasman M.D.   On: 06/04/2024 16:52    ASSESSMENT AND PLAN: This is a very pleasant 78 years old African-American male who was evaluated for iron deficiency anemia secondary to gastritis and incidentally he was found to have elevated M spike with free kappa light chain  of 68.5%.  Bone marrow biopsy and aspirate performed in March 12, 2023 showed hypercellular marrow with 5-8% plasma cells. 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 MCV is low at 71.3, indicating microcytic anemia. No recent bleeding reported. Previous colonoscopy and upper endoscopy were unremarkable. Iron supplementation was reduced to one tablet daily by gastroenterologist. Iron infusion is indicated due to persistent low hemoglobin and iron levels. - Arranged for iron infusion, likely starting on Friday. - Plan for three doses of iron infusion. - Scheduled follow-up appointment in three months to reassess hemoglobin levels. The patient was advised to call immediately if he has any other concerning symptoms in the interval. The patient voices understanding of current disease status and treatment options and is in agreement with the current care plan.  All questions were answered. The patient knows to call the clinic with any problems, questions or concerns. We can certainly see the patient much sooner if necessary.  The total time spent in the appointment was 20 minutes.  Disclaimer: This note was dictated with  voice recognition software. Similar sounding words can inadvertently be transcribed and may not be corrected upon review.

## 2024-06-14 NOTE — Telephone Encounter (Signed)
 Auth Submission: NO AUTH NEEDED Site of care: Site of care: [facility] Payer: [employer] MEDICARE Medication & CPT/J Code(s) submitted: Venofer (Iron Sucrose) J1756 Diagnosis Code:  Route of submission (phone, fax, portal):  Phone # Fax # Auth type: Buy/Bill PB Units/visits requested: 3 DOSES Reference number:  Approval from: 06/14/24 to 08/09/24

## 2024-06-15 ENCOUNTER — Other Ambulatory Visit: Payer: Self-pay

## 2024-06-15 ENCOUNTER — Telehealth: Payer: Self-pay | Admitting: Gastroenterology

## 2024-06-15 DIAGNOSIS — D500 Iron deficiency anemia secondary to blood loss (chronic): Secondary | ICD-10-CM

## 2024-06-15 DIAGNOSIS — K5521 Angiodysplasia of colon with hemorrhage: Secondary | ICD-10-CM

## 2024-06-15 NOTE — Telephone Encounter (Signed)
 Canceled previously scheduled EGD. Scheduled for enteroscopy 06/26/24 at 11:15 am with Dr Legrand. Instructed to stop all solid foods at midnight 06/26/24. Clear liquids only until 6:00 am. Arrive at 9:45 am unless instructed otherwise by the hospital. Spouse included on phone call with these instructions using teach back method. Written instruction mailed.

## 2024-06-15 NOTE — Telephone Encounter (Signed)
 This patient underwent a small bowel video capsule study for iron deficiency anemia on 05/30/2024.  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  These are common cause of microscopic blood loss and contribute to iron deficiency anemia.  It is difficult to know for certain based on this capsule study, but 1 or more of the few small AVMs seen might be within reach of small bowel enteroscopy (and therefore could be ablated with APC if reached).  My advice is that he undergo small bowel enteroscopy with me at my next available outpatient [facility] procedure block.  If he is agreeable, please help make the arrangements.  VEAR Brand MD

## 2024-06-15 NOTE — Telephone Encounter (Signed)
 Spoke with the patient and the spouse on speaker phone. Questions invited and answered. Both state they are in agreement with the plan of care.They understand this will be done at the hospital. I will call them with appointment information. No further questions at the end of the call.

## 2024-06-19 ENCOUNTER — Encounter (HOSPITAL_COMMUNITY): Payer: Self-pay | Admitting: Gastroenterology

## 2024-06-19 NOTE — Progress Notes (Signed)
 Pre op call Kayla Agent   PCPGLENWOOD Gull MD CardiologistGLENWOOD Clay MD Pulmonologist-n/a  EKG-04/24/24 Echo-06/07/24 Cath-n/a Stress-n/a ICD/PM-n/a GLP1-n/a Blood Thinner-n/a  History: HTN, Prostate CA, Murmur.Was referred to heart MD due to new murmur found earlier this year. Last saw cardio 9/15 and they ordered echo which was done 10/29 . Per pts wife got the results and they were fine, they told him if he has any swelling just to use compression hose. I asked if any symptoms and she said he hasn't had any chest pains/sob uses cane if long distance.  Anesthesia Review- Yes- okay to proceed

## 2024-06-19 NOTE — Progress Notes (Signed)
 Attempted to obtain medical history for pre op call via telephone, unable to reach at this time. HIPAA compliant voicemail message left requesting return call to pre surgical testing department.

## 2024-06-21 ENCOUNTER — Ambulatory Visit (INDEPENDENT_AMBULATORY_CARE_PROVIDER_SITE_OTHER)

## 2024-06-21 VITALS — BP 133/79 | HR 80 | Temp 97.7°F | Resp 16 | Ht 67.0 in | Wt 237.0 lb

## 2024-06-21 DIAGNOSIS — D500 Iron deficiency anemia secondary to blood loss (chronic): Secondary | ICD-10-CM

## 2024-06-21 MED ORDER — SODIUM CHLORIDE 0.9 % IV SOLN
300.0000 mg | INTRAVENOUS | Status: DC
  Administered 2024-06-21: 300 mg via INTRAVENOUS
  Filled 2024-06-21: qty 15

## 2024-06-21 NOTE — Progress Notes (Signed)
 Diagnosis: Iron Deficiency Anemia  Provider:  Praveen Mannam MD  Procedure: IV Infusion  IV Type: Peripheral, IV Location: L Forearm  Venofer (Iron Sucrose), Dose: 300 mg  Infusion Start Time: 1125  Infusion Stop Time: 1302  Post Infusion IV Care: Observation period completed and Peripheral IV Discontinued  Discharge: Condition: Good, Destination: Home . AVS Provided  Performed by:  Leita FORBES Miles, LPN

## 2024-06-26 ENCOUNTER — Encounter (HOSPITAL_COMMUNITY)
Admission: RE | Disposition: A | Discharge status: Home / Self Care | Payer: Self-pay | Source: Home / Self Care | Attending: Gastroenterology

## 2024-06-26 ENCOUNTER — Ambulatory Visit (HOSPITAL_COMMUNITY): Admitting: Anesthesiology

## 2024-06-26 ENCOUNTER — Encounter (HOSPITAL_COMMUNITY): Payer: Self-pay | Admitting: Gastroenterology

## 2024-06-26 ENCOUNTER — Other Ambulatory Visit: Payer: Self-pay

## 2024-06-26 ENCOUNTER — Ambulatory Visit (HOSPITAL_COMMUNITY)
Admission: RE | Admit: 2024-06-26 | Discharge: 2024-06-26 | Disposition: A | Discharge status: Home / Self Care | Attending: Gastroenterology | Admitting: Gastroenterology

## 2024-06-26 DIAGNOSIS — Z6837 Body mass index (BMI) 37.0-37.9, adult: Secondary | ICD-10-CM

## 2024-06-26 DIAGNOSIS — D500 Iron deficiency anemia secondary to blood loss (chronic): Secondary | ICD-10-CM

## 2024-06-26 DIAGNOSIS — I10 Essential (primary) hypertension: Secondary | ICD-10-CM | POA: Insufficient documentation

## 2024-06-26 DIAGNOSIS — K5520 Angiodysplasia of colon without hemorrhage: Secondary | ICD-10-CM | POA: Diagnosis present

## 2024-06-26 DIAGNOSIS — E66813 Obesity, class 3: Secondary | ICD-10-CM | POA: Diagnosis not present

## 2024-06-26 DIAGNOSIS — K5521 Angiodysplasia of colon with hemorrhage: Secondary | ICD-10-CM

## 2024-06-26 HISTORY — DX: Cardiac murmur, unspecified: R01.1

## 2024-06-26 HISTORY — PX: ENTEROSCOPY: SHX5533

## 2024-06-26 MED ORDER — PROPOFOL 500 MG/50ML IV EMUL
INTRAVENOUS | Status: DC | PRN
  Administered 2024-06-26: 150 ug/kg/min via INTRAVENOUS

## 2024-06-26 MED ORDER — LIDOCAINE 2% (20 MG/ML) 5 ML SYRINGE
INTRAMUSCULAR | Status: DC | PRN
  Administered 2024-06-26: 100 mg via INTRAVENOUS

## 2024-06-26 MED ORDER — SODIUM CHLORIDE 0.9 % IV SOLN: INTRAVENOUS | Status: DC

## 2024-06-26 MED ORDER — PROPOFOL 10 MG/ML IV BOLUS
INTRAVENOUS | Status: DC | PRN
  Administered 2024-06-26 (×2): 25 mg via INTRAVENOUS

## 2024-06-26 NOTE — Transfer of Care (Signed)
 Immediate Anesthesia Transfer of Care Note  Patient: Lance Rollins  Procedure(s) Performed: ENTEROSCOPY  Patient Location: PACU  Anesthesia Type:MAC  Level of Consciousness: drowsy, patient cooperative, and responds to stimulation  Airway & Oxygen Therapy: Patient Spontanous Breathing and Patient connected to face mask oxygen  Post-op Assessment: Report given to RN and Post -op Vital signs reviewed and stable  Post vital signs: Reviewed and stable  Last Vitals:  Vitals Value Taken Time  BP 112/54 06/26/24 12:40  Temp    Pulse 87 06/26/24 12:41  Resp 18 06/26/24 12:41  SpO2 99 % 06/26/24 12:41  Vitals shown include unfiled device data.  Last Pain:  Vitals:   06/26/24 1103  TempSrc: Temporal  PainSc: 0-No pain      Patients Stated Pain Goal: 0 (06/26/24 1103)  Complications: No notable events documented.

## 2024-06-26 NOTE — Interval H&P Note (Signed)
 History and Physical Interval Note:  06/26/2024 12:12 PM  Lance Rollins  has presented today for surgery, with the diagnosis of AVM iron def anemia.  The various methods of treatment have been discussed with the patient and family. After consideration of risks, benefits and other options for treatment, the patient has consented to  Procedure(s): ENTEROSCOPY (N/A) as a surgical intervention.  The patient's history has been reviewed, patient examined, no change in status, stable for surgery.  I have reviewed the patient's chart and labs.  Questions were answered to the patient's satisfaction.     Victory LITTIE Brand III

## 2024-06-26 NOTE — H&P (Signed)
 History and Physical:  This patient presents for endoscopic testing for:  78 year old man here today for endoscopic evaluation of iron deficiency anemia from blood loss. No clear source was found on an upper endoscopy 04/05/2024 nor on EGD and colonoscopy February 2024. Persistent anemia and iron deficiency despite oral supplementation and hematology service then led to further workup with a small bowel video capsule study.  This was performed recently and showed at least 1 small bowel AVM that might be within reach of enteroscopy.  He also received IV iron recently but has yet to have a follow-up hemoglobin afterward.  He denies abdominal pain, black tarry stool or bright red blood per rectum. We discussed this anemia at length as well as the rationale for further endoscopic assessment. Procedure was described in detail along with risks and benefits and he was agreeable.  Patient is otherwise without complaints or active issues today.   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/23/2017     Past Surgical History: Past Surgical History:  Procedure Laterality Date   BIOPSY  09/30/2022   Procedure: BIOPSY;  Surgeon: Eda Iha, MD;  Location[facility] ENDOSCOPY;  Service: Gastroenterology;;   COLONOSCOPY WITH PROPOFOL  N/A 10/01/2022   Procedure: COLONOSCOPY WITH PROPOFOL ;  Surgeon: Eda Iha, MD;  Location: [facility] ENDOSCOPY;  Service: Gastroenterology;  Laterality: N/A;   ESOPHAGOGASTRODUODENOSCOPY N/A 08/15/2013   Procedure: ESOPHAGOGASTRODUODENOSCOPY (EGD);  Surgeon: Toribio SHAUNNA Cedar, MD;  Location: [facility] ENDOSCOPY;  Service: Endoscopy;  Laterality: N/A;   ESOPHAGOGASTRODUODENOSCOPY (EGD) WITH PROPOFOL  N/A 09/30/2022   Procedure:  ESOPHAGOGASTRODUODENOSCOPY (EGD) WITH PROPOFOL ;  Surgeon: Eda Iha, MD;  Location: [facility] ENDOSCOPY;  Service: Gastroenterology;  Laterality: N/A;   GIVENS CAPSULE STUDY  10/01/2022   Procedure: GIVENS CAPSULE STUDY;  Surgeon: Eda Iha, MD;  Location: [facility] ENDOSCOPY;  Service: Gastroenterology;;   INGUINAL HERNIA REPAIR Bilateral ~ 2004   ROBOT ASSISTED LAPAROSCOPIC RADICAL PROSTATECTOMY  08/2009   thelbert 08/14/2009 (08/14/2013)   TOTAL HIP ARTHROPLASTY Left 11/14/2013   Procedure: LEFT TOTAL HIP ARTHROPLASTY ANTERIOR APPROACH;  Surgeon: Lonni CINDERELLA Poli, MD;  Location: [facility] OR;  Service: Orthopedics;  Laterality: Left;   TOTAL HIP ARTHROPLASTY Right 04/23/2017   Procedure: RIGHT TOTAL HIP ARTHROPLASTY ANTERIOR APPROACH;  Surgeon: Poli Lonni CINDERELLA, MD;  Location: [facility] ORS;  Service: Orthopedics;  Laterality: Right;    Allergies: No Known Allergies  Outpatient Meds: Current Facility-Administered Medications  Medication Dose Route Frequency Provider Last Rate Last Admin   0.9 %  sodium chloride  infusion   Intravenous Continuous Legrand Victory CROME III, MD 20 mL/hr at 06/26/24 1106 New Bag at 06/26/24 1106      ___________________________________________________________________ Objective   Exam:  BP (!) 167/93   Temp 97.9 F (36.6 C) (Temporal)   Resp 18   Ht 5' 7 (1.702 m)   Wt 107.5 kg   SpO2 97%   BMI 37.12 kg/m   CV: regular , S1/S2 Resp: clear to auscultation bilaterally, normal RR and effort noted GI: soft, no tenderness, with active bowel sounds.   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  The benefits and risks of the  planned procedure(s) were described in detail with the patient or (when appropriate) their health care proxy.  Risks were outlined as including, but not limited to, bleeding, infection, perforation, adverse  medication reaction leading to cardiac or pulmonary decompensation, pancreatitis (if ERCP).  The limitation of incomplete mucosal visualization was also discussed.  No guarantees or warranties were given.  The patient was provided an opportunity to ask questions and all were answered. The patient agreed with the plan.   The patient is appropriate for an endoscopic procedure in the ambulatory setting.   - Victory Brand, MD

## 2024-06-26 NOTE — Anesthesia Postprocedure Evaluation (Signed)
 Anesthesia Post Note  Patient: Lance Rollins  Procedure(s) Performed: ENTEROSCOPY     Patient location during evaluation: Endoscopy Anesthesia Type: MAC Level of consciousness: awake and alert Pain management: pain level controlled Vital Signs Assessment: post-procedure vital signs reviewed and stable Respiratory status: spontaneous breathing, nonlabored ventilation, respiratory function stable and patient connected to nasal cannula oxygen Cardiovascular status: stable and blood pressure returned to baseline Postop Assessment: no apparent nausea or vomiting Anesthetic complications: no   No notable events documented.  Last Vitals:  Vitals:   06/26/24 1250 06/26/24 1300  BP: (!) 143/80 (!) 159/83  Pulse: 79 70  Resp: (!) 23 17  Temp:    SpO2: 94% 97%    Last Pain:  Vitals:   06/26/24 1240  TempSrc:   PainSc: (P) 0-No pain                 Niam Nepomuceno S

## 2024-06-26 NOTE — Anesthesia Preprocedure Evaluation (Signed)
 Anesthesia Evaluation  Patient identified by MRN, date of birth, ID band Patient awake    Reviewed: Allergy & Precautions, H&P , NPO status , Patient's Chart, lab work & pertinent test results  Airway Mallampati: II   Neck ROM: full    Dental   Pulmonary neg pulmonary ROS   breath sounds clear to auscultation       Cardiovascular hypertension,  Rhythm:regular Rate:Normal     Neuro/Psych    GI/Hepatic   Endo/Other    Class 3 obesity  Renal/GU      Musculoskeletal  (+) Arthritis ,    Abdominal   Peds  Hematology   Anesthesia Other Findings   Reproductive/Obstetrics                              Anesthesia Physical Anesthesia Plan  ASA: 2  Anesthesia Plan: MAC   Post-op Pain Management:    Induction: Intravenous  PONV Risk Score and Plan: 1 and Propofol  infusion and Treatment may vary due to age or medical condition  Airway Management Planned: Simple Face Mask  Additional Equipment:   Intra-op Plan:   Post-operative Plan:   Informed Consent: I have reviewed the patients History and Physical, chart, labs and discussed the procedure including the risks, benefits and alternatives for the proposed anesthesia with the patient or authorized representative who has indicated his/her understanding and acceptance.     Dental advisory given  Plan Discussed with: CRNA, Anesthesiologist and Surgeon  Anesthesia Plan Comments:         Anesthesia Quick Evaluation

## 2024-06-26 NOTE — Discharge Instructions (Signed)

## 2024-06-26 NOTE — Op Note (Signed)
 [facility] Patient Name: Lance Rollins Procedure Date: 06/26/2024 MRN: [id number] Attending MD: Victory CROME. Legrand , MD, [id number] Date of Birth: 05/08/1946 CSN: [id number] Age: 78 Admit Type: Outpatient Procedure:                Small bowel enteroscopy Indications:              Iron deficiency anemia secondary to chronic blood                            loss, Angioectasia of small bowel                           Persistent IDA on oral iron (recurrent over years),                            no source on EGD and colonoscopy. Video capsule                            study in 2024 did not reveal a source.                           VCE last month saw a few diminutive nonbleeding                            AVMs most likely in the jejunum (based on time of                            capsule passage from duodenal bulb) Providers:                Victory CROME. Legrand, MD, Randall Lines, RN, Curtistine Bishop, Technician Referring MD:              Medicines:                Monitored Anesthesia Care Complications:            No immediate complications. Estimated Blood Loss:     Estimated blood loss: none. Procedure:                Pre-Anesthesia Assessment:                           - Prior to the procedure, a History and Physical                            was performed, and patient medications and                            allergies were reviewed. The patient's tolerance of                            previous anesthesia was also reviewed. The risks  and benefits of the procedure and the sedation                            options and risks were discussed with the patient.                            All questions were answered, and informed consent                            was obtained. Prior Anticoagulants: The patient has                            taken no anticoagulant or antiplatelet agents. ASA                            Grade  Assessment: II - A patient with mild systemic                            disease. After reviewing the risks and benefits,                            the patient was deemed in satisfactory condition to                            undergo the procedure.                           After obtaining informed consent, the endoscope was                            passed under direct vision. Throughout the                            procedure, the patient's blood pressure, pulse, and                            oxygen saturations were monitored continuously. The                            [id number]-HQ190DL ([id number]) Olympus Colonoscope was                            introduced through the mouth and advanced to the                            proximal jejunum (entire length of the scope used                            with some looping in the stomach per usual). The                            small bowel enteroscopy was accomplished without  difficulty. The patient tolerated the procedure                            well. Scope In: Scope Out: Findings:      The esophagus was normal.      The stomach was normal.      The examined duodenum was normal.      There was no evidence of significant pathology in the entire examined       portion of jejunum. Impression:               - Normal esophagus.                           - Normal stomach.                           - Normal examined duodenum.                           - The examined portion of the jejunum was normal.                           - No specimens collected. Recommendation:           - Patient has a contact number available for                            emergencies. The signs and symptoms of potential                            delayed complications were discussed with the                            patient. Return to normal activities tomorrow.                            Written discharge instructions were provided to  the                            patient.                           - Resume regular diet.                           - Follow-up closely with hematology for monitoring                            of hemoglobin and iron levels with IV iron                            treatment as needed.                           (Patient received IV iron treatments within the  last few weeks but has not yet had follow-up lab                            work)                           If periodic IV iron is able to maintain adequate                            levels of iron and hemoglobin, then my advice is to                            manage this conservatively in that manner.                           Should IV iron fail to do so, then communicate this                            back to our office and we will consider the timing                            and feasibility of balloon enteroscopy. (This                            report with recommendations will be communicated to                            the patient's hematology provider and primary care) Procedure Code(s):        --- Professional ---                           [phone], Small intestinal endoscopy, enteroscopy                            beyond second portion of duodenum, not including                            ileum; diagnostic, including collection of                            specimen(s) by brushing or washing, when performed                            (separate procedure) Diagnosis Code(s):        --- Professional ---                           D50.0, Iron deficiency anemia secondary to blood                            loss (chronic)                           K55.20, Angiodysplasia of colon without hemorrhage CPT copyright 2022 American Medical  Association. All rights reserved. The codes documented in this report are preliminary and upon coder review may  be revised to meet current compliance requirements. [name]  L. Legrand, MD 06/26/2024 12:44:48 PM This report has been signed electronically. Number of Addenda: 0

## 2024-06-27 ENCOUNTER — Encounter: Admitting: Gastroenterology

## 2024-06-27 ENCOUNTER — Encounter: Payer: Self-pay | Admitting: Gastroenterology

## 2024-06-28 ENCOUNTER — Encounter (HOSPITAL_COMMUNITY): Payer: Self-pay | Admitting: Gastroenterology

## 2024-06-28 ENCOUNTER — Ambulatory Visit

## 2024-06-28 VITALS — BP 144/77 | HR 81 | Temp 98.1°F | Resp 18 | Ht 67.0 in | Wt 231.2 lb

## 2024-06-28 DIAGNOSIS — D500 Iron deficiency anemia secondary to blood loss (chronic): Secondary | ICD-10-CM

## 2024-06-28 MED ORDER — IRON SUCROSE 300 MG IVPB - SIMPLE MED
300.0000 mg | Status: DC
  Administered 2024-06-28: 300 mg via INTRAVENOUS

## 2024-06-28 NOTE — Progress Notes (Signed)
 Diagnosis: Iron Deficiency Anemia  Provider:  Praveen Mannam MD  Procedure: IV Infusion  IV Type: Peripheral, IV Location: L Antecubital  Venofer (iron sucrose), Dose: 300 mg  Infusion Start Time: 1359  Infusion Stop Time: 1537  Post Infusion IV Care: Observation period completed and Peripheral IV Discontinued  Discharge: Condition: Good, Destination: Home . AVS Provided  Performed by:  Maximiano JONELLE Pouch, LPN

## 2024-07-05 ENCOUNTER — Ambulatory Visit (INDEPENDENT_AMBULATORY_CARE_PROVIDER_SITE_OTHER)

## 2024-07-05 VITALS — BP 184/103 | HR 100 | Temp 97.5°F | Resp 16 | Ht 67.0 in | Wt 235.4 lb

## 2024-07-05 DIAGNOSIS — D500 Iron deficiency anemia secondary to blood loss (chronic): Secondary | ICD-10-CM

## 2024-07-05 MED ORDER — IRON SUCROSE 300 MG IVPB - SIMPLE MED
300.0000 mg | Status: DC
  Administered 2024-07-05: 300 mg via INTRAVENOUS
  Filled 2024-07-05: qty 265

## 2024-07-05 NOTE — Progress Notes (Signed)
 Diagnosis: Iron  Deficiency Anemia  Provider:  Mannam, Praveen MD  Procedure: IV Infusion  IV Type: Peripheral, IV Location: L Antecubital   Venofer  (Iron  Sucrose), Dose: 300 mg  Infusion Start Time: 1108  Infusion Stop Time: 1252  Post Infusion IV Care: Observation period completed and Peripheral IV Discontinued  Discharge: Condition: Good, Destination: Home . AVS Declined  Performed by:  Maximiano JONELLE Pouch, LPN

## 2024-07-24 ENCOUNTER — Encounter: Payer: Self-pay | Admitting: Cardiology

## 2024-07-24 ENCOUNTER — Ambulatory Visit: Attending: Cardiology | Admitting: Cardiology

## 2024-07-24 ENCOUNTER — Ambulatory Visit: Admitting: Cardiology

## 2024-07-24 VITALS — BP 118/88 | HR 105 | Ht 67.0 in | Wt 230.0 lb

## 2024-07-24 DIAGNOSIS — I872 Venous insufficiency (chronic) (peripheral): Secondary | ICD-10-CM | POA: Diagnosis not present

## 2024-07-24 DIAGNOSIS — I350 Nonrheumatic aortic (valve) stenosis: Secondary | ICD-10-CM | POA: Insufficient documentation

## 2024-07-24 DIAGNOSIS — I10 Essential (primary) hypertension: Secondary | ICD-10-CM | POA: Diagnosis not present

## 2024-07-24 DIAGNOSIS — E7800 Pure hypercholesterolemia, unspecified: Secondary | ICD-10-CM | POA: Diagnosis not present

## 2024-07-24 MED ORDER — ROSUVASTATIN CALCIUM 40 MG PO TABS: 40.0000 mg | ORAL_TABLET | Freq: Every day | ORAL | 3 refills | Status: AC

## 2024-07-24 NOTE — Patient Instructions (Addendum)
 Medication Instructions:  Stop simvastatin  Start rosuvastatin  40 mg  daily   *If you need a refill on your cardiac medications before your next appointment, please call your pharmacy*   Lab Work: Feb 2026 CMP LIPID If you have labs (blood work) drawn today and your tests are completely normal, you will receive your results only by: MyChart Message (if you have MyChart) OR A paper copy in the mail If you have any lab test that is abnormal or we need to change your treatment, we will call you to review the results.   Testing/Procedures:  Schedule Oct 2026---Your physician has requested that you have an echocardiogram. Echocardiography is a painless test that uses sound waves to create images of your heart. It provides your doctor with information about the size and shape of your heart and how well your hearts chambers and valves are working. This procedure takes approximately one hour. There are no restrictions for this procedure. Please do NOT wear cologne, perfume, aftershave, or lotions (deodorant is allowed). Please arrive 15 minutes prior to your appointment time.  Please note: We ask at that you not bring children with you during ultrasound (echo/ vascular) testing. Due to room size and safety concerns, children are not allowed in the ultrasound rooms during exams. Our front office staff cannot provide observation of children in our lobby area while testing is being conducted. An adult accompanying a patient to their appointment will only be allowed in the ultrasound room at the discretion of the ultrasound technician under special circumstances. We apologize for any inconvenience.    Follow-Up: At [facility], you and your health needs are our priority.  As part of our continuing mission to provide you with exceptional heart care, we have created designated Provider Care Teams.  These Care Teams include your primary Cardiologist (physician) and Advanced Practice Providers (APPs -   Physician Assistants and Nurse Practitioners) who all work together to provide you with the care you need, when you need it.     Your next appointment:   11 month(s) after echo   The format for your next appointment:   In Person  Provider:   Alm Clay, MD   Other Instructions

## 2024-07-24 NOTE — Progress Notes (Unsigned)
°  Cardiology Office Note:  .   Date:  07/24/2024  ID:  Lance Rollins, DOB Feb 25, 1946, MRN [id number] PCP: [name], MD   HeartCare Providers Cardiologist:  Alm Clay, MD { Click to update primary MD,subspecialty MD or APP then REFRESH:1}    No chief complaint on file.   Patient Profile: .     Lance Rollins is a *** 78 y.o. male *** with a PMH notable for *** who presents here for *** at the request of Okey, Carlin Redbird, MD.  Lance Rollins is a moderately obese 78 y.o. male with a PMH notable for Hypertension, Hyperlipidemia, and History of Prostate Cancer and MGUS who presents here for Evaluation of Heart Murmur at the request of [name], MD.  Other PMH includes OA, history of GI bleed with gastritis and duodenitis as well as gastric polyps, rectal dysfunction        Lance Rollins was last seen on ***  Subjective  Discussed the use of AI scribe software for clinical note transcription with the patient, who gave verbal consent to proceed.  History of Present Illness      Cardiovascular ROS: {roscv:310661}  ROS:  Review of Systems - {ros master:310782}    Objective    Studies Reviewed: SABRA       No results found for: CHOL, HDL, LDLCALC, LDLDIRECT, TRIG, CHOLHDL Lab Results  Component Value Date   NA 134 (L) 06/04/2024   K 4.1 06/04/2024   CREATININE 1.36 (H) 06/04/2024   GFRNONAA 54 (L) 06/04/2024   GLUCOSE 92 06/04/2024   Results  ECHO: *** CATH: *** MONITOR: *** CT: ***  Risk Assessment/Calculations:               Physical Exam:   VS:  BP 118/88   Pulse (!) 105   Ht 5' 7 (1.702 m)   Wt 230 lb (104.3 kg)   SpO2 96%   BMI 36.02 kg/m    Wt Readings from Last 3 Encounters:  07/24/24 230 lb (104.3 kg)  07/05/24 235 lb 6.4 oz (106.8 kg)  06/28/24 231 lb 3.2 oz (104.9 kg)    Physical Exam    GEN: Well nourished, well developed in no acute distress; *** NECK: No JVD; No carotid bruits CARDIAC:  Normal S1, S2; RRR, no murmurs, rubs, gallops RESPIRATORY:  Clear to auscultation without rales, wheezing or rhonchi ; nonlabored, good air movement. ABDOMEN: Soft, non-tender, non-distended EXTREMITIES:  No edema; No deformity      ASSESSMENT AND PLAN: .    Problem List Items Addressed This Visit       Cardiology Problems   Moderate aortic stenosis by prior echocardiogram   Primary hypertension - Primary (Chronic)    Assessment and Plan Assessment & Plan        {Are you ordering a CV Procedure (e.g. stress test, cath, DCCV, TEE, etc)?   Press F2        :[id number]}   Follow-Up: No follow-ups on file.  I spent *** minutes in the care of Lance Rollins today including {CHL AMB CAR Time Based Billing Options STW (Optional):[phone]::documenting in the encounter.}      Signed, Alm MICAEL Clay, MD, MS Alm Clay, M.D., M.S. Interventional Cardiologist  [facility] Pager # [phone]

## 2024-07-28 ENCOUNTER — Encounter: Payer: Self-pay | Admitting: Cardiology

## 2024-07-28 ENCOUNTER — Ambulatory Visit: Payer: Self-pay | Admitting: Cardiology

## 2024-07-28 LAB — COMPREHENSIVE METABOLIC PANEL WITH GFR
ALT: 16 IU/L (ref 0–44)
AST: 15 IU/L (ref 0–40)
Albumin: 4 g/dL (ref 3.8–4.8)
Alkaline Phosphatase: 84 IU/L (ref 47–123)
BUN/Creatinine Ratio: 22 (ref 10–24)
BUN: 19 mg/dL (ref 8–27)
Bilirubin Total: 0.4 mg/dL (ref 0.0–1.2)
CO2: 25 mmol/L (ref 20–29)
Calcium: 9.8 mg/dL (ref 8.6–10.2)
Chloride: 99 mmol/L (ref 96–106)
Creatinine, Ser: 0.86 mg/dL (ref 0.76–1.27)
Globulin, Total: 3 g/dL (ref 1.5–4.5)
Glucose: 112 mg/dL — ABNORMAL HIGH (ref 70–99)
Potassium: 4.5 mmol/L (ref 3.5–5.2)
Sodium: 141 mmol/L (ref 134–144)
Total Protein: 7 g/dL (ref 6.0–8.5)
eGFR: 90 mL/min/1.73

## 2024-07-28 LAB — LIPID PANEL
Chol/HDL Ratio: 3.7 ratio (ref 0.0–5.0)
Cholesterol, Total: 236 mg/dL — ABNORMAL HIGH (ref 100–199)
HDL: 64 mg/dL
LDL Chol Calc (NIH): 159 mg/dL — ABNORMAL HIGH (ref 0–99)
Triglycerides: 76 mg/dL (ref 0–149)
VLDL Cholesterol Cal: 13 mg/dL (ref 5–40)

## 2024-07-28 NOTE — Assessment & Plan Note (Signed)
 Venous reflux in right deep femoral vein and left common femoral and saphenous veins. No clots. Swelling improved with support socks. No procedures for deep vein involvement. - Continue wearing support socks, switch to thigh-highs if swelling increases. - Consider daily baby aspirin  to reduce clot risk.

## 2024-07-28 NOTE — Assessment & Plan Note (Addendum)
 Simvastatin  not effectively controlling cholesterol. Plan to switch to more potent statin to manage cholesterol and reduce aortic valve plaque risk. - Switched to rosuvastatin  40 mg daily. - Ordered cholesterol labs for February.  Rosuvastatin  Calcium ; Take 1 tablet (40 mg total) by mouth daily.  Dispense: 90 tablet; Refill: 3

## 2024-07-28 NOTE — Assessment & Plan Note (Signed)
 Calcium  buildup on the aortic valve causing turbulence and murmur.  Echocardiogram: Moderate AS with mean gradient 25, and otherwise normal Left Ventricle size and function, EF 60-65%.  He remains asymptomatic. - Recheck Echocardiogram in October 2026 to monitor progression. - Monitor for symptoms such as chest pain, shortness of breath, or syncope.

## 2024-07-28 NOTE — Assessment & Plan Note (Signed)
 Blood pressure well-controlled today. -Continue current regimen with irbesartan-HCTZ 300-12.5 mg daily.

## 2024-08-23 ENCOUNTER — Ambulatory Visit: Admitting: Cardiology

## 2024-09-01 ENCOUNTER — Other Ambulatory Visit: Payer: Self-pay | Admitting: Family Medicine

## 2024-09-01 DIAGNOSIS — R9389 Abnormal findings on diagnostic imaging of other specified body structures: Secondary | ICD-10-CM

## 2024-09-05 ENCOUNTER — Emergency Department (HOSPITAL_COMMUNITY)

## 2024-09-05 ENCOUNTER — Encounter (HOSPITAL_COMMUNITY): Payer: Self-pay

## 2024-09-05 ENCOUNTER — Inpatient Hospital Stay (HOSPITAL_COMMUNITY)
Admission: EM | Admit: 2024-09-05 | Discharge: 2024-09-07 | DRG: 690 | Disposition: A | Discharge status: Home / Self Care | Attending: Internal Medicine | Admitting: Internal Medicine

## 2024-09-05 ENCOUNTER — Other Ambulatory Visit: Payer: Self-pay

## 2024-09-05 DIAGNOSIS — R5381 Other malaise: Secondary | ICD-10-CM | POA: Diagnosis present

## 2024-09-05 DIAGNOSIS — R6510 Systemic inflammatory response syndrome (SIRS) of non-infectious origin without acute organ dysfunction: Principal | ICD-10-CM | POA: Diagnosis present

## 2024-09-05 DIAGNOSIS — Z6834 Body mass index (BMI) 34.0-34.9, adult: Secondary | ICD-10-CM

## 2024-09-05 DIAGNOSIS — Z8546 Personal history of malignant neoplasm of prostate: Secondary | ICD-10-CM

## 2024-09-05 DIAGNOSIS — N179 Acute kidney failure, unspecified: Secondary | ICD-10-CM | POA: Diagnosis present

## 2024-09-05 DIAGNOSIS — I10 Essential (primary) hypertension: Secondary | ICD-10-CM | POA: Diagnosis present

## 2024-09-05 DIAGNOSIS — M069 Rheumatoid arthritis, unspecified: Secondary | ICD-10-CM | POA: Diagnosis present

## 2024-09-05 DIAGNOSIS — K5520 Angiodysplasia of colon without hemorrhage: Secondary | ICD-10-CM | POA: Diagnosis present

## 2024-09-05 DIAGNOSIS — E861 Hypovolemia: Secondary | ICD-10-CM | POA: Diagnosis present

## 2024-09-05 DIAGNOSIS — Z803 Family history of malignant neoplasm of breast: Secondary | ICD-10-CM

## 2024-09-05 DIAGNOSIS — E66811 Obesity, class 1: Secondary | ICD-10-CM | POA: Diagnosis present

## 2024-09-05 DIAGNOSIS — E876 Hypokalemia: Secondary | ICD-10-CM | POA: Diagnosis present

## 2024-09-05 DIAGNOSIS — N390 Urinary tract infection, site not specified: Principal | ICD-10-CM | POA: Diagnosis present

## 2024-09-05 DIAGNOSIS — Z833 Family history of diabetes mellitus: Secondary | ICD-10-CM

## 2024-09-05 DIAGNOSIS — Z96643 Presence of artificial hip joint, bilateral: Secondary | ICD-10-CM | POA: Diagnosis present

## 2024-09-05 DIAGNOSIS — R531 Weakness: Principal | ICD-10-CM

## 2024-09-05 DIAGNOSIS — E669 Obesity, unspecified: Secondary | ICD-10-CM | POA: Diagnosis present

## 2024-09-05 DIAGNOSIS — Z79899 Other long term (current) drug therapy: Secondary | ICD-10-CM

## 2024-09-05 DIAGNOSIS — E871 Hypo-osmolality and hyponatremia: Secondary | ICD-10-CM | POA: Diagnosis present

## 2024-09-05 DIAGNOSIS — Z886 Allergy status to analgesic agent status: Secondary | ICD-10-CM

## 2024-09-05 DIAGNOSIS — E860 Dehydration: Secondary | ICD-10-CM | POA: Diagnosis present

## 2024-09-05 DIAGNOSIS — K219 Gastro-esophageal reflux disease without esophagitis: Secondary | ICD-10-CM | POA: Diagnosis present

## 2024-09-05 DIAGNOSIS — E7800 Pure hypercholesterolemia, unspecified: Secondary | ICD-10-CM | POA: Diagnosis present

## 2024-09-05 DIAGNOSIS — K5521 Angiodysplasia of colon with hemorrhage: Secondary | ICD-10-CM | POA: Diagnosis present

## 2024-09-05 LAB — URINALYSIS, ROUTINE W REFLEX MICROSCOPIC
Bilirubin Urine: NEGATIVE
Glucose, UA: NEGATIVE mg/dL
Hgb urine dipstick: NEGATIVE
Ketones, ur: NEGATIVE mg/dL
Nitrite: NEGATIVE
Protein, ur: >=300 mg/dL — AB
Specific Gravity, Urine: 1.022 (ref 1.005–1.030)
WBC, UA: >50 WBC/hpf (ref 0–5)
pH: 5 (ref 5.0–8.0)

## 2024-09-05 LAB — I-STAT CHEM 8, ED
BUN: 33 mg/dL — ABNORMAL HIGH (ref 8–23)
Calcium, Ion: 1.03 mmol/L — ABNORMAL LOW (ref 1.15–1.40)
Chloride: 94 mmol/L — ABNORMAL LOW (ref 98–111)
Creatinine, Ser: 2 mg/dL — ABNORMAL HIGH (ref 0.61–1.24)
Glucose, Bld: 120 mg/dL — ABNORMAL HIGH (ref 70–99)
HCT: 53 % — ABNORMAL HIGH (ref 39.0–52.0)
Hemoglobin: 18 g/dL — ABNORMAL HIGH (ref 13.0–17.0)
Potassium: 3.4 mmol/L — ABNORMAL LOW (ref 3.5–5.1)
Sodium: 133 mmol/L — ABNORMAL LOW (ref 135–145)
TCO2: 25 mmol/L (ref 22–32)

## 2024-09-05 LAB — COMPREHENSIVE METABOLIC PANEL WITH GFR
ALT: 30 U/L (ref 0–44)
AST: 40 U/L (ref 15–41)
Albumin: 3.3 g/dL — ABNORMAL LOW (ref 3.5–5.0)
Alkaline Phosphatase: 66 U/L (ref 38–126)
Anion gap: 10 (ref 5–15)
BUN: 28 mg/dL — ABNORMAL HIGH (ref 8–23)
CO2: 27 mmol/L (ref 22–32)
Calcium: 8.9 mg/dL (ref 8.9–10.3)
Chloride: 95 mmol/L — ABNORMAL LOW (ref 98–111)
Creatinine, Ser: 1.38 mg/dL — ABNORMAL HIGH (ref 0.61–1.24)
GFR, Estimated: 53 mL/min — ABNORMAL LOW
Glucose, Bld: 93 mg/dL (ref 70–99)
Potassium: 4.1 mmol/L (ref 3.5–5.1)
Sodium: 131 mmol/L — ABNORMAL LOW (ref 135–145)
Total Bilirubin: 0.5 mg/dL (ref 0.0–1.2)
Total Protein: 6.9 g/dL (ref 6.5–8.1)

## 2024-09-05 LAB — PROTIME-INR
INR: 1.2 (ref 0.8–1.2)
Prothrombin Time: 16.3 s — ABNORMAL HIGH (ref 11.4–15.2)

## 2024-09-05 LAB — CBC WITH DIFFERENTIAL/PLATELET
Abs Immature Granulocytes: 0.03 10*3/uL (ref 0.00–0.07)
Basophils Absolute: 0.1 10*3/uL (ref 0.0–0.1)
Basophils Relative: 1 %
Eosinophils Absolute: 0.1 10*3/uL (ref 0.0–0.5)
Eosinophils Relative: 1 %
HCT: 50.3 % (ref 39.0–52.0)
Hemoglobin: 16.1 g/dL (ref 13.0–17.0)
Immature Granulocytes: 0 %
Lymphocytes Relative: 20 %
Lymphs Abs: 1.9 10*3/uL (ref 0.7–4.0)
MCH: 25.6 pg — ABNORMAL LOW (ref 26.0–34.0)
MCHC: 32 g/dL (ref 30.0–36.0)
MCV: 80.1 fL (ref 80.0–100.0)
Monocytes Absolute: 0.9 10*3/uL (ref 0.1–1.0)
Monocytes Relative: 10 %
Neutro Abs: 6.3 10*3/uL (ref 1.7–7.7)
Neutrophils Relative %: 68 %
Platelets: 339 10*3/uL (ref 150–400)
RBC: 6.28 MIL/uL — ABNORMAL HIGH (ref 4.22–5.81)
RDW: 19.5 % — ABNORMAL HIGH (ref 11.5–15.5)
WBC: 9.3 10*3/uL (ref 4.0–10.5)
nRBC: 0 % (ref 0.0–0.2)

## 2024-09-05 LAB — APTT: aPTT: 28 s (ref 24–36)

## 2024-09-05 LAB — I-STAT CG4 LACTIC ACID, ED
Lactic Acid, Venous: 2.8 mmol/L (ref 0.5–1.9)
Lactic Acid, Venous: 2.4 mmol/L (ref 0.5–1.9)
Lactic Acid, Venous: 1.8 mmol/L (ref 0.5–1.9)

## 2024-09-05 LAB — TROPONIN T, HIGH SENSITIVITY
Troponin T High Sensitivity: 24 ng/L — ABNORMAL HIGH (ref 0–19)
Troponin T High Sensitivity: 16 ng/L (ref 0–19)

## 2024-09-05 LAB — TYPE AND SCREEN
ABO/RH(D): B POS
Antibody Screen: NEGATIVE

## 2024-09-05 LAB — LACTIC ACID, PLASMA: Lactic Acid, Venous: 1.7 mmol/L (ref 0.5–1.9)

## 2024-09-05 LAB — PHOSPHORUS: Phosphorus: 3.1 mg/dL (ref 2.5–4.6)

## 2024-09-05 LAB — PRO BRAIN NATRIURETIC PEPTIDE: Pro Brain Natriuretic Peptide: <50 pg/mL

## 2024-09-05 LAB — LIPASE, BLOOD: Lipase: 31 U/L (ref 11–51)

## 2024-09-05 LAB — MAGNESIUM: Magnesium: 2.1 mg/dL (ref 1.7–2.4)

## 2024-09-05 MED ORDER — ACETAMINOPHEN 325 MG PO TABS: 650.0000 mg | ORAL_TABLET | Freq: Four times a day (QID) | ORAL | Status: DC | PRN

## 2024-09-05 MED ORDER — SENNOSIDES-DOCUSATE SODIUM 8.6-50 MG PO TABS: 1.0000 | ORAL_TABLET | Freq: Every evening | ORAL | Status: DC | PRN

## 2024-09-05 MED ORDER — PREDNISONE 5 MG PO TABS
5.0000 mg | ORAL_TABLET | Freq: Every day | ORAL | Status: DC
  Administered 2024-09-06 – 2024-09-07 (×2): 5 mg via ORAL
  Filled 2024-09-05 (×2): qty 1

## 2024-09-05 MED ORDER — ENSURE PLUS HIGH PROTEIN PO LIQD
237.0000 mL | Freq: Two times a day (BID) | ORAL | Status: DC
  Administered 2024-09-06 – 2024-09-07 (×3): 237 mL via ORAL

## 2024-09-05 MED ORDER — FUROSEMIDE 20 MG PO TABS: 20.0000 mg | ORAL_TABLET | Freq: Every day | ORAL | Status: DC

## 2024-09-05 MED ORDER — ONDANSETRON HCL 4 MG/2ML IJ SOLN: 4.0000 mg | Freq: Four times a day (QID) | INTRAMUSCULAR | Status: DC | PRN

## 2024-09-05 MED ORDER — LACTATED RINGERS IV BOLUS (SEPSIS)
500.0000 mL | Freq: Once | INTRAVENOUS | Status: AC
  Administered 2024-09-05: 500 mL via INTRAVENOUS

## 2024-09-05 MED ORDER — HEPARIN SODIUM (PORCINE) 5000 UNIT/ML IJ SOLN
5000.0000 [IU] | Freq: Three times a day (TID) | INTRAMUSCULAR | Status: DC
  Administered 2024-09-05 – 2024-09-07 (×6): 5000 [IU] via SUBCUTANEOUS
  Filled 2024-09-05 (×6): qty 1

## 2024-09-05 MED ORDER — SODIUM CHLORIDE 0.9 % IV BOLUS
1000.0000 mL | Freq: Once | INTRAVENOUS | Status: AC
  Administered 2024-09-05: 1000 mL via INTRAVENOUS

## 2024-09-05 MED ORDER — SODIUM CHLORIDE 0.9 % IV SOLN
2.0000 g | INTRAVENOUS | Status: DC
  Administered 2024-09-06: 2 g via INTRAVENOUS
  Filled 2024-09-05: qty 20

## 2024-09-05 MED ORDER — PANTOPRAZOLE SODIUM 40 MG PO TBEC
40.0000 mg | DELAYED_RELEASE_TABLET | Freq: Two times a day (BID) | ORAL | Status: DC
  Administered 2024-09-06 – 2024-09-07 (×3): 40 mg via ORAL
  Filled 2024-09-05 (×3): qty 1

## 2024-09-05 MED ORDER — ONDANSETRON HCL 4 MG PO TABS: 4.0000 mg | ORAL_TABLET | Freq: Four times a day (QID) | ORAL | Status: DC | PRN

## 2024-09-05 MED ORDER — SODIUM CHLORIDE 0.9 % IV SOLN: INTRAVENOUS | Status: DC

## 2024-09-05 MED ORDER — ACETAMINOPHEN 650 MG RE SUPP: 650.0000 mg | Freq: Four times a day (QID) | RECTAL | Status: DC | PRN

## 2024-09-05 MED ORDER — LACTATED RINGERS IV BOLUS (SEPSIS)
1000.0000 mL | Freq: Once | INTRAVENOUS | Status: AC
  Administered 2024-09-05: 1000 mL via INTRAVENOUS

## 2024-09-05 MED ORDER — FLORANEX PO PACK
1.0000 g | PACK | Freq: Three times a day (TID) | ORAL | Status: DC
  Administered 2024-09-05 – 2024-09-07 (×6): 1 g via ORAL
  Filled 2024-09-05 (×8): qty 1

## 2024-09-05 MED ORDER — HYDROMORPHONE HCL 1 MG/ML IJ SOLN: 0.5000 mg | INTRAMUSCULAR | Status: DC | PRN

## 2024-09-05 MED ORDER — POTASSIUM CHLORIDE CRYS ER 20 MEQ PO TBCR
40.0000 meq | EXTENDED_RELEASE_TABLET | Freq: Once | ORAL | Status: AC
  Administered 2024-09-05: 40 meq via ORAL
  Filled 2024-09-05: qty 2

## 2024-09-05 MED ORDER — SODIUM CHLORIDE 0.9% FLUSH
3.0000 mL | Freq: Two times a day (BID) | INTRAVENOUS | Status: DC
  Administered 2024-09-05 – 2024-09-07 (×2): 3 mL via INTRAVENOUS

## 2024-09-05 MED ORDER — SODIUM CHLORIDE 0.9% FLUSH: 3.0000 mL | Freq: Two times a day (BID) | INTRAVENOUS | Status: DC

## 2024-09-05 MED ORDER — OXYCODONE HCL 5 MG PO TABS: 5.0000 mg | ORAL_TABLET | ORAL | Status: DC | PRN

## 2024-09-05 MED ORDER — TRAZODONE HCL 50 MG PO TABS: 25.0000 mg | ORAL_TABLET | Freq: Every evening | ORAL | Status: DC | PRN

## 2024-09-05 MED ORDER — IPRATROPIUM BROMIDE 0.02 % IN SOLN: 0.5000 mg | Freq: Four times a day (QID) | RESPIRATORY_TRACT | Status: DC | PRN

## 2024-09-05 MED ORDER — HYDRALAZINE HCL 20 MG/ML IJ SOLN: 10.0000 mg | INTRAMUSCULAR | Status: DC | PRN

## 2024-09-05 MED ORDER — DICLOFENAC SODIUM 1 % EX GEL
2.0000 g | Freq: Four times a day (QID) | CUTANEOUS | Status: DC
  Administered 2024-09-06: 2 g via TOPICAL
  Filled 2024-09-05 (×2): qty 100

## 2024-09-05 MED ORDER — LEFLUNOMIDE 10 MG PO TABS
20.0000 mg | ORAL_TABLET | Freq: Every day | ORAL | Status: DC
  Administered 2024-09-06 – 2024-09-07 (×2): 20 mg via ORAL
  Filled 2024-09-05 (×3): qty 2

## 2024-09-05 MED ORDER — FLEET ENEMA RE ENEM: 1.0000 | ENEMA | Freq: Once | RECTAL | Status: DC | PRN

## 2024-09-05 MED ORDER — ROSUVASTATIN CALCIUM 20 MG PO TABS
40.0000 mg | ORAL_TABLET | Freq: Every day | ORAL | Status: DC
  Administered 2024-09-06 – 2024-09-07 (×2): 40 mg via ORAL
  Filled 2024-09-05 (×2): qty 2

## 2024-09-05 MED ORDER — SODIUM CHLORIDE 0.9 % IV SOLN
1.0000 g | Freq: Once | INTRAVENOUS | Status: AC
  Administered 2024-09-05: 1 g via INTRAVENOUS
  Filled 2024-09-05: qty 10

## 2024-09-05 MED ORDER — LACTATED RINGERS IV SOLN
150.0000 mL/h | INTRAVENOUS | Status: AC
  Administered 2024-09-05 – 2024-09-06 (×3): 150 mL/h via INTRAVENOUS

## 2024-09-05 MED ORDER — FERROUS SULFATE 325 (65 FE) MG PO TABS
325.0000 mg | ORAL_TABLET | Freq: Every day | ORAL | Status: DC
  Administered 2024-09-06 – 2024-09-07 (×2): 325 mg via ORAL
  Filled 2024-09-05 (×2): qty 1

## 2024-09-05 NOTE — Assessment & Plan Note (Signed)
 Likely due to dehydration -BUN 33, creatinine 2.0, -Continue IV fluid hydration, avoiding nephrotoxins

## 2024-09-05 NOTE — Assessment & Plan Note (Signed)
 No signs of bleeding, continue PPI

## 2024-09-05 NOTE — Sepsis Progress Note (Signed)
 eLink is following this Code Sepsis.

## 2024-09-05 NOTE — ED Provider Notes (Signed)
 " [facility] Provider Note   CSN: [id number] Arrival date & time: 09/05/24  1259     Patient presents with: Chest Pain, Abdominal Pain, and Altered Mental Status   Russel Morain is a 79 y.o. male.  {Add pertinent medical, surgical, social history, OB history to HPI:666} 78 year old male with prior medical history as detailed below presents for evaluation.  Patient is companied by his wife.  Per wife, patient with increased confusion times a week.  Patient with vague abdominal pain, lack of appetite, decreased p.o. intake.  Patient without fever.  Patient with known history of prostate cancer.  Patient has been on steroids for multiple months per wife's report.  Patient is alert and oriented x 4.  Patient complains of malaise and fatigue.  He is without focal weakness.  The history is provided by the patient and medical records.       Prior to Admission medications  Medication Sig Start Date End Date Taking? Authorizing Provider  cyclobenzaprine  (FLEXERIL ) 10 MG tablet Take 1 tablet (10 mg total) by mouth 3 (three) times daily as needed for muscle spasms. 04/12/24   Lovorn, Megan, MD  diclofenac  (VOLTAREN ) 75 MG EC tablet TAKE 1 TABLET BY MOUTH ONCE DAILY AS NEEDED FOR MILD PAIN WITH FOOD 05/30/24   Lovorn, Megan, MD  diclofenac  Sodium (VOLTAREN  ARTHRITIS PAIN) 1 % GEL Apply 2 g topically 4 (four) times daily. 06/04/24   Sharlet Dowdy, MD  ferrous sulfate  325 (65 FE) MG EC tablet Take 1 tablet (325 mg total) by mouth daily with breakfast. 10/02/22 06/14/24  Cheryle Page, MD  irbesartan-hydrochlorothiazide (AVALIDE) 300-12.5 MG tablet Take 1 tablet by mouth daily. 11/03/19   [provider]  Multiple Vitamins-Minerals (MULTIVITAMIN ADULT, MINERALS,) TABS 1 tablet Orally daily    [provider]  pantoprazole  (PROTONIX ) 40 MG tablet Take 1 tablet (40 mg total) by mouth 2 (two) times daily before a meal. 03/16/24 06/14/24  May, Deanna  J, NP  rosuvastatin  (CRESTOR ) 40 MG tablet Take 1 tablet (40 mg total) by mouth daily. 07/24/24 10/22/24  Anner Alm ORN, MD    Allergies: Ibuprofen    Review of Systems  All other systems reviewed and are negative.   Updated Vital Signs BP 123/67 (BP Location: Right Arm)   Pulse (!) 105   Temp 97.7 F (36.5 C) (Oral)   Resp 19   Ht 5' 7 (1.702 m)   Wt 104.3 kg   SpO2 96%   BMI 36.01 kg/m   Physical Exam Vitals and nursing note reviewed.  Constitutional:      General: He is not in acute distress.    Appearance: He is well-developed.  HENT:     Head: Normocephalic and atraumatic.  Eyes:     Conjunctiva/sclera: Conjunctivae normal.  Cardiovascular:     Rate and Rhythm: Normal rate and regular rhythm.     Heart sounds: No murmur heard. Pulmonary:     Effort: Pulmonary effort is normal. No respiratory distress.     Breath sounds: Normal breath sounds.  Abdominal:     Palpations: Abdomen is soft.     Tenderness: There is no abdominal tenderness.  Musculoskeletal:        General: No swelling.     Cervical back: Neck supple.  Skin:    General: Skin is warm and dry.     Capillary Refill: Capillary refill takes less than 2 seconds.  Neurological:     General: No focal  deficit present.     Mental Status: He is alert and oriented to person, place, and time.  Psychiatric:        Mood and Affect: Mood normal.     (all labs ordered are listed, but only abnormal results are displayed) Labs Reviewed  CBC WITH DIFFERENTIAL/PLATELET - Abnormal; Notable for the following components:      Result Value   RBC 6.28 (*)    MCH 25.6 (*)    RDW 19.5 (*)    All other components within normal limits  I-STAT CHEM 8, ED - Abnormal; Notable for the following components:   Sodium 133 (*)    Potassium 3.4 (*)    Chloride 94 (*)    BUN 33 (*)    Creatinine, Ser 2.00 (*)    Glucose, Bld 120 (*)    Calcium , Ion 1.03 (*)    Hemoglobin 18.0 (*)    HCT 53.0 (*)    All other  components within normal limits  I-STAT CG4 LACTIC ACID, ED - Abnormal; Notable for the following components:   Lactic Acid, Venous 2.8 (*)    All other components within normal limits  CULTURE, BLOOD (ROUTINE X 2)  CULTURE, BLOOD (ROUTINE X 2)  LIPASE, BLOOD  URINALYSIS, ROUTINE W REFLEX MICROSCOPIC  TYPE AND SCREEN  TROPONIN T, HIGH SENSITIVITY    EKG: None  Radiology: No results found.  {Document cardiac monitor, telemetry assessment procedure when appropriate:32947} Procedures   Medications Ordered in the ED  sodium chloride  0.9 % bolus 1,000 mL (has no administration in time range)      {Click here for ABCD2, HEART and other calculators REFRESH Note before signing:1}                              Medical Decision Making Amount and/or Complexity of Data Reviewed Radiology: ordered.   ***  {Document critical care time when appropriate  Document review of labs and clinical decision tools ie CHADS2VASC2, etc  Document your independent review of radiology images and any outside records  Document your discussion with family members, caretakers and with consultants  Document social determinants of health affecting pt's care  Document your decision making why or why not admission, treatments were needed:32947:::1}   Final diagnoses:  None    ED Discharge Orders     None        "

## 2024-09-05 NOTE — Assessment & Plan Note (Signed)
 Continue statins

## 2024-09-05 NOTE — Hospital Course (Signed)
 Temesgen Weightman is a 79 year old male with extensive history of arthritis (recently started on steroids), OA, HTN, HLD, GERD, AS .SABRASABRA Presenting with confusion, weakness, abdominal pain, poor appetite, with poor p.o. intake. Denies any fever or chills.  Denies of any nausea or vomiting, constipation or diarrhea.   ED Evaluation:  Blood pressure 111/71, pulse 85, temperature 97.7 F (36.5 C), temperature source Oral, resp. rate 15, height 5' 7 (1.702 m), weight 104.3 kg, SpO2 97%.  LABs: Sodium 133, potassium 3.4, chloride 94, glucose 120, BUN 33, creatinine 2.0, calcium  ionized 1.03, lipase 31, troponin 24, lactic acid 2.8 >> 2.4, Hemoglobin 9.3,, hematocrit 18,  UA: Turbid, amber, moderate leukocytes, proteinuria, rare bacteria WBC >50  CT head: Chronic small vessel disease, no acute abnormalities CT chest abdomen pelvis:  no source of infection in abdomen pelvis some bronchiectasis  Requested patient to be admitted for UTI, AKI, dehydration,.SABRASABRA

## 2024-09-05 NOTE — ED Provider Triage Note (Signed)
 Emergency Medicine Provider Triage Evaluation Note  Lance Rollins , a 79 y.o. male  was evaluated in triage.  Pt complains of confusion. Per wife, pt has been c/o abd pain, loss of appetite, weight loss, and increased confusion for more than a week.  Occasional cough, no fever, no urinary sxs.  Hx of prostate CA.  Hx of GIB, recently was on a long course of steroid and celebrex  Review of Systems  Positive: As above Negative: As above  Physical Exam  BP 123/67 (BP Location: Right Arm)   Pulse (!) 105   Temp 97.7 F (36.5 C) (Oral)   Resp 19   Ht 5' 7 (1.702 m)   Wt 104.3 kg   SpO2 96%   BMI 36.01 kg/m  Gen:   Awake, no distress   Resp:  Normal effort  MSK:   Moves extremities without difficulty  Other:    Medical Decision Making  Medically screening exam initiated at 1:23 PM.  Appropriate orders placed.  Hersey Maclellan was informed that the remainder of the evaluation will be completed by another provider, this initial triage assessment does not replace that evaluation, and the importance of remaining in the ED until their evaluation is complete.     Lance Colon, PA-C 09/05/24 1325

## 2024-09-05 NOTE — Assessment & Plan Note (Signed)
 Stable follow-up with as an outpatient

## 2024-09-05 NOTE — Assessment & Plan Note (Signed)
 Rheumatoid Tritus with chronic osteoarthritis -Was recently started on steroids, to be continued and tapered off accordingly -Continue as needed analgesics -Avoiding NSAIDs due to history of AVM, GI bleed -Continue PPI monitoring closely

## 2024-09-05 NOTE — H&P (Signed)
 " History and Physical   Patient: Lance Rollins                            PCP: [name], MD                    DOB: [birth year]            DOA: 09/05/2024 FMW:[id number]             DOS: 09/05/2024, 7:05 PM  [name], MD  Patient coming from:   HOME  I have personally reviewed patient's medical records, in electronic medical records, including:  [facility], and care everywhere.    Chief Complaint:   Chief Complaint  Patient presents with   Chest Pain   Abdominal Pain   Altered Mental Status    History of present illness:    Lance Rollins is a 79 year old male with extensive history of arthritis (recently started on steroids), OA, HTN, HLD, GERD, AS .SABRASABRA Presenting with confusion, weakness, abdominal pain, poor appetite, with poor p.o. intake. Denies any fever or chills.  Denies of any nausea or vomiting, constipation or diarrhea.   ED Evaluation:  Blood pressure 111/71, pulse 85, temperature 97.7 F (36.5 C), temperature source Oral, resp. rate 15, height 5' 7 (1.702 m), weight 104.3 kg, SpO2 97%.  LABs: Sodium 133, potassium 3.4, chloride 94, glucose 120, BUN 33, creatinine 2.0, calcium  ionized 1.03, lipase 31, troponin 24, lactic acid 2.8 >> 2.4, Hemoglobin 9.3,, hematocrit 18,  UA: Turbid, amber, moderate leukocytes, proteinuria, rare bacteria WBC >50  CT head: Chronic small vessel disease, no acute abnormalities CT chest abdomen pelvis:  no source of infection in abdomen pelvis some bronchiectasis  Requested patient to be admitted for UTI, AKI, dehydration,...     Patient Denies having: Fever, Chills, Cough, SOB, Chest Pain, Abd pain, N/V/D, headache, dizziness, lightheadedness,  Dysuria, Joint pain, rash, open wounds   Review of Systems: As per HPI, otherwise 10 point review of systems were negative.   ----------------------------------------------------------------------------------------------------------------------  Allergies[1]  Home  MEDs:  Prior to Admission medications  Medication Sig Start Date End Date Taking? Authorizing Provider  diclofenac  Sodium (VOLTAREN  ARTHRITIS PAIN) 1 % GEL Apply 2 g topically 4 (four) times daily. 06/04/24  Yes Sharlet Dowdy, MD  ferrous sulfate  325 (65 FE) MG EC tablet Take 1 tablet (325 mg total) by mouth daily with breakfast. 10/02/22 09/05/24 Yes Cheryle Page, MD  furosemide  (LASIX ) 20 MG tablet Take 20 mg by mouth daily. 08/23/24  Yes [provider]  irbesartan-hydrochlorothiazide (AVALIDE) 300-12.5 MG tablet Take 1 tablet by mouth daily. 11/03/19  Yes [provider]  leflunomide  (ARAVA ) 20 MG tablet Take 20 mg by mouth daily. 06/20/24  Yes [provider]  Multiple Vitamins-Minerals (MULTIVITAMIN ADULT, MINERALS,) TABS 1 tablet Orally daily   Yes [provider]  pantoprazole  (PROTONIX ) 40 MG tablet Take 1 tablet (40 mg total) by mouth 2 (two) times daily before a meal. 03/16/24 09/05/24 Yes May, Deanna J, NP  predniSONE  (DELTASONE ) 5 MG tablet Take 5 mg by mouth daily with breakfast. Label on bottle is for the 5 MG but label underneath is for 10 MG. Patient SO is certain  he is taking the 5 MG.   Yes [provider]  rosuvastatin  (CRESTOR ) 40 MG tablet Take 1 tablet (40 mg total) by mouth daily. 07/24/24 10/22/24 Yes Anner Alm ORN, MD  simvastatin  (ZOCOR ) 40  MG tablet Take 40 mg by mouth every evening.   Yes [provider]  traMADol  (ULTRAM ) 50 MG tablet Take 50 mg by mouth every 12 (twelve) hours as needed for moderate pain (pain score 4-6). 05/28/24  Yes [provider]  cyclobenzaprine  (FLEXERIL ) 10 MG tablet Take 1 tablet (10 mg total) by mouth 3 (three) times daily as needed for muscle spasms. Patient not taking: Reported on 09/05/2024 04/12/24   Lovorn, Megan, MD  diclofenac  (VOLTAREN ) 75 MG EC tablet TAKE 1 TABLET BY MOUTH ONCE DAILY AS NEEDED FOR MILD PAIN WITH FOOD Patient not taking: Reported on 09/05/2024 05/30/24   Lovorn,  Megan, MD    PRN MEDs: acetaminophen  **OR** acetaminophen , hydrALAZINE , HYDROmorphone  (DILAUDID ) injection, ipratropium, ondansetron  **OR** ondansetron  (ZOFRAN ) IV, oxyCODONE , senna-docusate, traZODone   Past Medical History:  Diagnosis Date   Arthritis    Bleeding per rectum 08/2013   black and bright red (08/14/2013)   Heart murmur    Hypercholesteremia    Hypertension    Obesity    Prostate cancer (HCC) 2011   Unilateral primary osteoarthritis, right hip 04/23/2017    Past Surgical History:  Procedure Laterality Date   BIOPSY  09/30/2022   Procedure: BIOPSY;  Surgeon: Eda Iha, MD;  Location: [facility] ENDOSCOPY;  Service: Gastroenterology;;   COLONOSCOPY WITH PROPOFOL  N/A 10/01/2022   Procedure: COLONOSCOPY WITH PROPOFOL ;  Surgeon: Eda Iha, MD;  Location: [facility] ENDOSCOPY;  Service: Gastroenterology;  Laterality: N/A;   ENTEROSCOPY N/A 06/26/2024   Procedure: ENTEROSCOPY;  Surgeon: Legrand Victory LITTIE DOUGLAS, MD;  Location: [facility] ENDOSCOPY;  Service: Gastroenterology;  Laterality: N/A;   ESOPHAGOGASTRODUODENOSCOPY N/A 08/15/2013   Procedure: ESOPHAGOGASTRODUODENOSCOPY (EGD);  Surgeon: Toribio SHAUNNA Cedar, MD;  Location: [facility] ENDOSCOPY;  Service: Endoscopy;  Laterality: N/A;   ESOPHAGOGASTRODUODENOSCOPY (EGD) WITH PROPOFOL  N/A 09/30/2022   Procedure: ESOPHAGOGASTRODUODENOSCOPY (EGD) WITH PROPOFOL ;  Surgeon: Eda Iha, MD;  Location: [facility] ENDOSCOPY;  Service: Gastroenterology;  Laterality: N/A;   GIVENS CAPSULE STUDY  10/01/2022   Procedure: GIVENS CAPSULE STUDY;  Surgeon: Eda Iha, MD;  Location: [facility] ENDOSCOPY;  Service: Gastroenterology;;   INGUINAL HERNIA REPAIR Bilateral ~ 2004   ROBOT ASSISTED LAPAROSCOPIC RADICAL PROSTATECTOMY  08/2009   thelbert 08/14/2009 (08/14/2013)   TOTAL HIP ARTHROPLASTY Left 11/14/2013   Procedure: LEFT TOTAL HIP ARTHROPLASTY ANTERIOR APPROACH;  Surgeon: Lonni CINDERELLA Poli, MD;  Location: [facility] OR;  Service: Orthopedics;  Laterality: Left;   TOTAL HIP  ARTHROPLASTY Right 04/23/2017   Procedure: RIGHT TOTAL HIP ARTHROPLASTY ANTERIOR APPROACH;  Surgeon: Poli Lonni CINDERELLA, MD;  Location: [facility] ORS;  Service: Orthopedics;  Laterality: Right;     reports that he has never smoked. He has never used smokeless tobacco. He reports current alcohol use. He reports that he does not use drugs.   Family History  Problem Relation Age of Onset   Diabetes Mother    Breast cancer Mother    Diabetes Sister    Colon cancer Neg Hx    Stomach cancer Neg Hx    Esophageal cancer Neg Hx    Pancreatic cancer Neg Hx    Rectal cancer Neg Hx     Physical Exam:   Vitals:   09/05/24 1500 09/05/24 1834 09/05/24 1845 09/05/24 1900  BP:   (!) 143/85   Pulse: 85  87 78  Resp: 15  (!) 27 14  Temp:  97.7 F (36.5 C)    TempSrc:  Oral    SpO2: 97%  94% 94%  Weight:      Height:  Constitutional: NAD, calm, comfortable Eyes: PERRL, lids and conjunctivae normal ENMT: Mucous membranes are moist. Posterior pharynx clear of any exudate or lesions.Normal dentition.  Neck: normal, supple, no masses, no thyromegaly Respiratory: clear to auscultation bilaterally, no wheezing, no crackles. Normal respiratory effort. No accessory muscle use.  Cardiovascular: Regular rate and rhythm, no murmurs / rubs / gallops. No extremity edema. 2+ pedal pulses. No carotid bruits.  Abdomen: no tenderness, no masses palpated. No hepatosplenomegaly. Bowel sounds positive.  Musculoskeletal: no clubbing / cyanosis. No joint deformity upper and lower extremities. Good ROM, no contractures. Normal muscle tone.  Neurologic: CN II-XII grossly intact. Sensation intact, DTR normal. Strength 5/5 in all 4.  Psychiatric: Normal judgment and insight. Alert and oriented x 3. Normal mood.  Skin: no rashes, lesions, ulcers. No induration    Labs on admission:    I have personally reviewed following labs and imaging studies  CBC: Recent Labs  Lab 09/05/24 1331 09/05/24 1337  WBC 9.3   --   NEUTROABS 6.3  --   HGB 16.1 18.0*  HCT 50.3 53.0*  MCV 80.1  --   PLT 339  --    Basic Metabolic Panel: Recent Labs  Lab 09/05/24 1337 09/05/24 1746  NA 133*  --   K 3.4*  --   CL 94*  --   GLUCOSE 120*  --   BUN 33*  --   CREATININE 2.00*  --   MG  --  2.1  PHOS  --  3.1   GFR: Estimated Creatinine Clearance: 36.2 mL/min (A) (by C-G formula based on SCr of 2 mg/dL (H)). Liver Function Tests: No results for input(s): AST, ALT, ALKPHOS, BILITOT, PROT, ALBUMIN in the last 168 hours. Recent Labs  Lab 09/05/24 1331  LIPASE 31    Urine analysis:    Component Value Date/Time   COLORURINE AMBER (A) 09/05/2024 1331   APPEARANCEUR TURBID (A) 09/05/2024 1331   LABSPEC 1.022 09/05/2024 1331   PHURINE 5.0 09/05/2024 1331   GLUCOSEU NEGATIVE 09/05/2024 1331   HGBUR NEGATIVE 09/05/2024 1331   BILIRUBINUR NEGATIVE 09/05/2024 1331   KETONESUR NEGATIVE 09/05/2024 1331   PROTEINUR >=300 (A) 09/05/2024 1331   UROBILINOGEN 0.2 11/08/2013 0946   NITRITE NEGATIVE 09/05/2024 1331   LEUKOCYTESUR MODERATE (A) 09/05/2024 1331    Last A1C:  No results found for: HGBA1C   Radiologic Exams on Admission:   DG Chest Port 1 View Result Date: 09/05/2024 CLINICAL DATA:  weakness EXAM: PORTABLE CHEST 1 VIEW COMPARISON:  Chest XR, 08/23/2024.  CT chest, 09/05/2024. FINDINGS: Aortic arch atherosclerosis. Cardiac silhouette is within normal limits. Lungs are relatively hypoinflated with mild perihilar interstitial thickening. No focal consolidation or mass. No pleural effusion or pneumothorax. No acute displaced fracture. IMPRESSION: 1. Hypoinflation without acute superimposed cardiopulmonary process. 2.  Aortic Atherosclerosis (ICD10-I70.0). Electronically Signed   By: Thom Hall M.D.   On: 09/05/2024 15:51   CT CHEST ABDOMEN PELVIS WO CONTRAST Result Date: 09/05/2024 EXAM: CT CHEST, ABDOMEN AND PELVIS WITHOUT CONTRAST 09/05/2024 02:36:44 PM TECHNIQUE: CT of the chest,  abdomen and pelvis was performed without the administration of intravenous contrast. Multiplanar reformatted images are provided for review. Automated exposure control, iterative reconstruction, and/or weight based adjustment of the mA/kV was utilized to reduce the radiation dose to as low as reasonably achievable. COMPARISON: None available. CLINICAL HISTORY: Sepsis. FINDINGS: CHEST: MEDIASTINUM AND LYMPH NODES: Normal heart size. 3-vessel coronary artery calcifications and aortic atherosclerosis. No pericardial effusion. The central airways are patent. No mediastinal,  hilar or axillary lymphadenopathy. LUNGS AND PLEURA: Bilateral lower lobe parenchymal banding which may reflect atelectasis and/or scarring. Cylindrical bronchiectasis is noted, more severe in the lower lung zones. No focal consolidation, interstitial edema, or pulmonary edema. No pleural effusion. No pneumothorax. No suspicious pulmonary nodule or mass. ABDOMEN AND PELVIS: LIVER: Unremarkable. GALLBLADDER AND BILE DUCTS: Unremarkable. No biliary ductal dilatation. SPLEEN: The spleen is within normal limits in size and appearance. PANCREAS: The pancreas is normal in size and contour without focal lesion or ductal dilatation. ADRENAL GLANDS: Normal size and morphology bilaterally. No nodule, thickening, or hemorrhage. No periadrenal stranding. KIDNEYS, URETERS AND BLADDER: Bilateral Bosniak class I kidney cysts are noted. The largest is in the right upper pole measuring 2.7 cm, image 55/4. Per consensus, no follow-up is needed for simple Bosniak type 1 and 2 renal cysts, unless the patient has a malignancy history or risk factors. No stones in the kidneys or ureters. No hydronephrosis. No perinephric or periureteral stranding. Bilateral hip arthroplasty devices create streak artifact, diminishing exam detail within the pelvis. No gross bladder abnormality noted. GI AND BOWEL: The stomach is normal. There is diffuse colonic diverticulosis. No signs of  acute diverticulitis. Moderate formed stool identified within the rectum. The appendix is visualized and appears normal. No dilated loops of large or small bowel. There is no bowel obstruction. REPRODUCTIVE ORGANS: No acute abnormality. PERITONEUM AND RETROPERITONEUM: No ascites. No free air. No fluid collections identified within the abdomen or pelvis. No signs of pneumoperitoneum. VASCULATURE: Aorta is normal in caliber. ABDOMINAL AND PELVIS LYMPH NODES: No lymphadenopathy. BONES AND SOFT TISSUES: Multilevel thoracic spondylosis. Lumbar degenerative disc disease. Right posterior diaphragmatic hernia contains fat only. Rectus diastasis identified. No acute or suspicious osseous findings. No focal soft tissue abnormality. IMPRESSION: 1. No CT source of infection identified in the chest, abdomen, or pelvis. 2. Cylindrical bronchiectasis, greatest in the lower lobes, which may reflect sequelae of chronic/recurrent infection or aspiration. Electronically signed by: Waddell Calk MD 09/05/2024 03:28 PM EST RP Workstation: [id number]   CT Head Wo Contrast Result Date: 09/05/2024 EXAM: CT HEAD WITHOUT CONTRAST 09/05/2024 02:36:44 PM TECHNIQUE: CT of the head was performed without the administration of intravenous contrast. Automated exposure control, iterative reconstruction, and/or weight based adjustment of the mA/kV was utilized to reduce the radiation dose to as low as reasonably achievable. COMPARISON: Head CT 12/08/2019. CLINICAL HISTORY: Mental status change, unknown cause. FINDINGS: BRAIN AND VENTRICLES: There is no evidence of an acute infarct, intracranial hemorrhage, mass, midline shift, hydrocephalus, or extra-axial fluid collection. Cerebral volume is within normal limits for age. Cerebral white matter hypodensities have slightly progressed and are nonspecific but compatible with mild chronic small vessel ischemic disease. Calcified atherosclerosis at the skull base. ORBITS: No acute abnormality. SINUSES:  No acute abnormality. SOFT TISSUES AND SKULL: No acute soft tissue abnormality. No skull fracture. IMPRESSION: 1. No acute intracranial abnormality. 2. Mild chronic small vessel ischemic disease. Electronically signed by: Dasie Hamburg MD 09/05/2024 03:25 PM EST RP Workstation: [id number]    EKG:   Independently reviewed.  Orders placed or performed during the hospital encounter of 09/05/24   EKG 12-Lead   EKG 12-Lead   ED EKG   ED EKG   EKG 12-Lead   ---------------------------------------------------------------------------------------------------------------------------------------    Assessment / Plan:   Principal Problem:   SIRS (systemic inflammatory response syndrome) (HCC) Active Problems:   Rheumatoid arthritis (HCC)   Urinary tract infection   AKI (acute kidney injury)   Hypokalemia   Primary hypertension  Pure hypercholesterolemia   History of malignant neoplasm of prostate   Moderate obesity   AVM (arteriovenous malformation) of small bowel, acquired with hemorrhage   Assessment and Plan: * SIRS (systemic inflammatory response syndrome) (HCC) Meeting SIRS criteria -Ruling out sepsis -Lactic acid 2.8, 2.4, WBC 9.3, creatinine 2.0 -Likely source of infection UTI -Continue IV fluid resuscitation, broad-spectrum antibiotics Rocephin  -Follow-up with the blood and urine cultures accordingly  Hypokalemia Serum potassium 3.4, checking serum magnesium level -Replating electrolytes accordingly  AKI (acute kidney injury) Likely due to dehydration -BUN 33, creatinine 2.0, -Continue IV fluid hydration, avoiding nephrotoxins  Urinary tract infection Continue antibiotic of Rocephin  -Will follow urine cultures and modify antibiotic accordingly  Rheumatoid arthritis (HCC) Rheumatoid Tritus with chronic osteoarthritis -Was recently started on steroids, to be continued and tapered off accordingly -Continue as needed analgesics -Avoiding NSAIDs due to history of  AVM, GI bleed -Continue PPI monitoring closely  Pure hypercholesterolemia Continue statins  Primary hypertension - Monitor BP closely,  - Holding Lasix , hydrochlorothiazide and irbesartan -due to AKI and electrode abnormality - As needed IV hydralazine      AVM (arteriovenous malformation) of small bowel, acquired with hemorrhage No signs of bleeding, continue PPI  Moderate obesity Body mass index is 36.01 kg/m. - Discussed regarding healthy diet and exercise Follow with PCP at weight loss programs  History of malignant neoplasm of prostate Stable follow-up with as an outpatient      Consults called:  None -------------------------------------------------------------------------------------------------------------------------------------------- DVT prophylaxis:  heparin  injection 5,000 Units Start: 09/05/24 1700 SCDs Start: 09/05/24 1646 Place TED hose Start: 09/05/24 1646   Code Status:   Code Status: Full Code   Admission status: Patient will be admitted as Inpatient, with a greater than 2 midnight length of stay. Level of care: Telemetry   Family Communication:  none at bedside  (The above findings and plan of care has been discussed with patient in detail, the patient expressed understanding and agreement of above plan)  --------------------------------------------------------------------------------------------------------------------------------------------------  Disposition Plan:  Anticipated 1-2 days Status is: Inpatient Remains inpatient appropriate because:Needing IV antibiotics, IV fluids ----------------------------------------------------------------------------------------------------------------------------------------------------  Time spent:  48  Min.  Was spent seeing and evaluating the patient, reviewing all medical records, drawn plan of care.  SIGNED: Adriana DELENA Grams, MD, FHM. FAAFP. [place] [facility], Pager  (Please use  [email or web address] to page/ or secure chat through epic) If 7PM-7AM, please contact night-coverage [email or web address],  09/05/2024, 7:05 PM     [1]  Allergies Allergen Reactions   Ibuprofen     Other Reaction(s): Internal bleeding   "

## 2024-09-05 NOTE — Assessment & Plan Note (Signed)
 Body mass index is 36.01 kg/m. - Discussed regarding healthy diet and exercise Follow with PCP at weight loss programs

## 2024-09-05 NOTE — Assessment & Plan Note (Addendum)
-   Monitor BP closely,  - Holding Lasix , hydrochlorothiazide and irbesartan -due to AKI and electrode abnormality - As needed IV hydralazine

## 2024-09-05 NOTE — ED Provider Notes (Signed)
" °  Physical Exam  BP 111/71   Pulse 85   Temp 97.7 F (36.5 C) (Oral)   Resp 15   Ht 5' 7 (1.702 m)   Wt 104.3 kg   SpO2 97%   BMI 36.01 kg/m   Physical Exam  Procedures  Procedures  ED Course / MDM   Clinical Course as of 09/06/24 0140  Tue Sep 05, 2024  1521 Assumed care from Dr Laurice. 79 yo M who presented with multiple complaints over the past 2 weeks. Vague abdominal discomfort. Intermittent confusion at home. AKI on iSTAT BMP. May have a UTI. Awaiting imaging. Giving fluids and rocephin . Was on steroids and recently discontinued.  [RP]  1618 Patient reassessed.  Overall tired but not in acute distress.  Has been on steroids for RA for 7 weeks. On 5 mg of prednisone . 2 weeks ago was paused for 1 week.  [RP]  1644 Dr Willette from hospitalist consulted for admission for confusion, AKI, and possible UTI.  [RP]    Clinical Course User Index [RP] Yolande Lamar BROCKS, MD   Medical Decision Making Amount and/or Complexity of Data Reviewed Labs: ordered. Radiology: ordered.  Risk Decision regarding hospitalization.      Yolande Lamar BROCKS, MD 09/06/24 [phone]  "

## 2024-09-05 NOTE — ED Triage Notes (Signed)
 Patient is altered, confused and wife reports complaining of severe abd pain with lack of appetite and vomiting.  Patient also has sob and weakness.  Recenty on months of steroids for RA.  Denies bowel issues or urinary symptoms.

## 2024-09-05 NOTE — ED Notes (Signed)
 Attempted to get the type labs x2 with no success. RN in triage aware

## 2024-09-05 NOTE — Assessment & Plan Note (Signed)
 Continue antibiotic of Rocephin  -Will follow urine cultures and modify antibiotic accordingly

## 2024-09-05 NOTE — Assessment & Plan Note (Signed)
 Serum potassium 3.4, checking serum magnesium level -Replating electrolytes accordingly

## 2024-09-05 NOTE — ED Triage Notes (Addendum)
 Pt. Stated Ive had stomach and chest pain up and down for a week. I need a xray my wife stated.

## 2024-09-05 NOTE — Assessment & Plan Note (Addendum)
 Meeting SIRS criteria -Ruling out sepsis -Lactic acid 2.8, 2.4, WBC 9.3, creatinine 2.0 -Likely source of infection UTI -Continue IV fluid resuscitation, broad-spectrum antibiotics Rocephin  -Follow-up with the blood and urine cultures accordingly

## 2024-09-06 DIAGNOSIS — R6510 Systemic inflammatory response syndrome (SIRS) of non-infectious origin without acute organ dysfunction: Secondary | ICD-10-CM | POA: Diagnosis not present

## 2024-09-06 LAB — GLUCOSE, CAPILLARY
Glucose-Capillary: 83 mg/dL (ref 70–99)
Glucose-Capillary: 93 mg/dL (ref 70–99)

## 2024-09-06 LAB — CBC
HCT: 46.1 % (ref 39.0–52.0)
Hemoglobin: 15 g/dL (ref 13.0–17.0)
MCH: 25.4 pg — ABNORMAL LOW (ref 26.0–34.0)
MCHC: 32.5 g/dL (ref 30.0–36.0)
MCV: 78.1 fL — ABNORMAL LOW (ref 80.0–100.0)
Platelets: 302 10*3/uL (ref 150–400)
RBC: 5.9 MIL/uL — ABNORMAL HIGH (ref 4.22–5.81)
RDW: 19.2 % — ABNORMAL HIGH (ref 11.5–15.5)
WBC: 10.7 10*3/uL — ABNORMAL HIGH (ref 4.0–10.5)
nRBC: 0 % (ref 0.0–0.2)

## 2024-09-06 LAB — BASIC METABOLIC PANEL WITH GFR
Anion gap: 13 (ref 5–15)
BUN: 24 mg/dL — ABNORMAL HIGH (ref 8–23)
CO2: 24 mmol/L (ref 22–32)
Calcium: 9.8 mg/dL (ref 8.9–10.3)
Chloride: 97 mmol/L — ABNORMAL LOW (ref 98–111)
Creatinine, Ser: 1.32 mg/dL — ABNORMAL HIGH (ref 0.61–1.24)
GFR, Estimated: 56 mL/min — ABNORMAL LOW
Glucose, Bld: 78 mg/dL (ref 70–99)
Potassium: 4 mmol/L (ref 3.5–5.1)
Sodium: 134 mmol/L — ABNORMAL LOW (ref 135–145)

## 2024-09-06 MED ORDER — LACTATED RINGERS IV SOLN: INTRAVENOUS | Status: AC

## 2024-09-06 NOTE — TOC CM/SW Note (Addendum)
 Transition of Care [facility]) - Inpatient Brief Assessment   Patient Details  Name: Lance Rollins MRN: [id number] Date of Birth: Jul 30, 1946  Transition of Care [facility] CM/SW Contact:    Waddell Barnie Rama, RN Phone Number: 09/06/2024, 12:18 PM   Clinical Narrative: From home with spouse, has PCP and insurance on file, states has no HH services in place at this time , has cane/walker at home.  States family member  (wife) will transport them home at [employer] and family is support system, states gets medications from [facility] .  Pta self ambulatory with cane.  There are no ICM needs identified  at this time.  Please place consult for ICM  needs. Per pt eval no PT f/u needed.    Transition of Care Asessment: Insurance and Status: Insurance coverage has been reviewed Patient has primary care physician: Yes Home environment has been reviewed: home with wife Prior level of function:: indep Prior/Current Home Services: No current home services Social Drivers of Health Review: SDOH reviewed no interventions necessary Readmission risk has been reviewed: Yes Transition of care needs: no transition of care needs at this time

## 2024-09-06 NOTE — Progress Notes (Signed)
 " PROGRESS NOTE    Lance Rollins  FMW:[id number] DOB: June 06, 1946 DOA: 09/05/2024 PCP: [name], MD   Brief Narrative:   79 year old male with extensive history of arthritis (recently started on steroids), OA, HTN, HLD, GERD, AS . Presenting with confusion, weakness, abdominal pain, poor appetite, with poor p.o. intake.   Admitted for management of AKI, Possible UTI, physical deconditioning and hypokalemia.  Assessment & Plan:  Principal Problem:   SIRS (systemic inflammatory response syndrome) (HCC) Active Problems:   Rheumatoid arthritis (HCC)   Urinary tract infection   AKI (acute kidney injury)   Hypokalemia   Primary hypertension   Pure hypercholesterolemia   History of malignant neoplasm of prostate   Moderate obesity   AVM (arteriovenous malformation) of small bowel, acquired with hemorrhage   Sepsis,POA:  -Lactic acid 2.8, WBC 9.3, tachycardia and creatinine 2.0 on admission.  -Likely source of infection is UTI -Continue IV fluid resuscitation, broad-spectrum antibiotics Rocephin  -Follow-up with the blood and urine cultures accordingly   Hypokalemia Resolved   AKI (acute kidney injury), POA: Improving Likely due to dehydration, pre-renal -BUN 33, creatinine 2.0 on admission. Creatinine today is 1.32. -Continue IV fluid hydration, avoiding nephrotoxins -Decreased rate of LR from 150 cc to 100 cc/hr.   Possible acute uncomplicated Urinary tract infection Continue antibiotic of Rocephin  -Will follow urine cultures and modify antibiotic accordingly   Rheumatoid arthritis  -Was recently started on steroids, to be continued and tapered off accordingly -Continue as needed analgesics -Continue with prednisone  5 mg daily -Avoiding NSAIDs due to history of AVM, GI bleed -Continue PPI monitoring closely   Pure hypercholesterolemia Continue statins   Primary hypertension - Monitor BP closely,  - Holding Lasix , hydrochlorothiazide and irbesartan -due to AKI  and electrode abnormality - As needed IV hydralazine   Hypovolemic hyponatremia,POA: Improving with IVF. F/u BMP in am.     AVM (arteriovenous malformation) of small bowel, acquired with hemorrhage No signs of bleeding, continue PPI   Class I obesity, POA:  Body mass index is 34.6 kg/m. - Discussed regarding healthy diet and exercise Follow with PCP at weight loss programs   History of malignant neoplasm of prostate Stable follow-up with as an outpatient  Disposition: Home.  Lives at home with his wife and is independent in activities of daily life.   DVT prophylaxis: heparin  injection 5,000 Units Start: 09/05/24 1700 SCDs Start: 09/05/24 1646 Place TED hose Start: 09/05/24 1646     Code Status: Full Code Family Communication: 2 daughters at the bedside Status is: Inpatient Remains inpatient appropriate because: AKI, UTI    Subjective:  No acute events overnight, Renal function is improving but not back to baseline yet.  Spoke to patient's daughters at the bedside.  They told me that he was seen by his rheumatologist 2 months back and was started on prednisone  5 mg daily for total of 2 months.  It was stopped and then he was seen by his PCP who restarted it.  Patient does have an appointment with rheumatologist in the next 1 to 2 weeks.  He lives at home with his wife.  They also mentioned to me episodes of presyncope/syncope which they have attributed to possible hypoglycemia in the setting of poor oral intake.  Patient told me that he was getting short of breath while ambulating from the bathroom to the restroom for the past 2 to 3 days.  Examination:  General exam: Appears calm and comfortable  Respiratory system: Clear to auscultation. Respiratory effort normal. Cardiovascular  system: S1 & S2 heard, RRR. No JVD, murmurs, rubs, gallops or clicks. No pedal edema. Gastrointestinal system: Abdomen is nondistended, soft and nontender. No organomegaly or masses felt. Normal  bowel sounds heard. Central nervous system: Alert and oriented. No focal neurological deficits. Extremities: Symmetric 5 x 5 power. Skin: No rashes, lesions or ulcers      Diet Orders (From admission, onward)     Start     Ordered   09/05/24 1646  Diet regular Room service appropriate? Yes; Fluid consistency: Thin  Diet effective now       Question Answer Comment  Room service appropriate? Yes   Fluid consistency: Thin      09/05/24 1646            Objective: Vitals:   09/05/24 1915 09/05/24 2107 09/06/24 0016 09/06/24 0356  BP: (!) 157/83 (!) 149/71 127/73 (!) 153/76  Pulse: 84 93 85 72  Resp: 19 19 20 20   Temp:  98.2 F (36.8 C) 98.2 F (36.8 C) 98.1 F (36.7 C)  TempSrc:  Oral Oral Oral  SpO2: 95% 94% 91% 97%  Weight:  99.5 kg  100.2 kg  Height:  5' 7 (1.702 m)      Intake/Output Summary (Last 24 hours) at 09/06/2024 0643 Last data filed at 09/06/2024 0457 Gross per 24 hour  Intake 5101.21 ml  Output --  Net 5101.21 ml   Filed Weights   09/05/24 1314 09/05/24 2107 09/06/24 0356  Weight: 104.3 kg 99.5 kg 100.2 kg    Scheduled Meds:  diclofenac  Sodium  2 g Topical QID   feeding supplement  237 mL Oral BID BM   ferrous sulfate   325 mg Oral Q breakfast   furosemide   20 mg Oral Daily   heparin   5,000 Units Subcutaneous Q8H   lactobacillus  1 g Oral TID WC   leflunomide   20 mg Oral Daily   pantoprazole   40 mg Oral BID AC   predniSONE   5 mg Oral Q breakfast   rosuvastatin   40 mg Oral Daily   sodium chloride  flush  3 mL Intravenous Q12H   Continuous Infusions:  cefTRIAXone  (ROCEPHIN )  IV     lactated ringers  150 mL/hr (09/06/24 0427)    Nutritional status     Body mass index is 34.61 kg/m.  Data Reviewed:   CBC: Recent Labs  Lab 09/05/24 1331 09/05/24 1337 09/06/24 0323  WBC 9.3  --  10.7*  NEUTROABS 6.3  --   --   HGB 16.1 18.0* 15.0  HCT 50.3 53.0* 46.1  MCV 80.1  --  78.1*  PLT 339  --  302   Basic Metabolic Panel: Recent Labs   Lab 09/05/24 1337 09/05/24 1746 09/05/24 2317 09/06/24 0323  NA 133*  --  131* 134*  K 3.4*  --  4.1 4.0  CL 94*  --  95* 97*  CO2  --   --  27 24  GLUCOSE 120*  --  93 78  BUN 33*  --  28* 24*  CREATININE 2.00*  --  1.38* 1.32*  CALCIUM   --   --  8.9 9.8  MG  --  2.1  --   --   PHOS  --  3.1  --   --    GFR: Estimated Creatinine Clearance: 53.7 mL/min (A) (by C-G formula based on SCr of 1.32 mg/dL (H)). Liver Function Tests: Recent Labs  Lab 09/05/24 2317  AST 40  ALT 30  ALKPHOS 66  BILITOT 0.5  PROT 6.9  ALBUMIN 3.3*   Recent Labs  Lab 09/05/24 1331  LIPASE 31   No results for input(s): AMMONIA in the last 168 hours. Coagulation Profile: Recent Labs  Lab 09/05/24 1746  INR 1.2   Cardiac Enzymes: No results for input(s): CKTOTAL, CKMB, CKMBINDEX, TROPONINI in the last 168 hours. BNP (last 3 results) Recent Labs    09/05/24 1746  PROBNP <50.0   HbA1C: No results for input(s): HGBA1C in the last 72 hours. CBG: Recent Labs  Lab 09/06/24 0616  GLUCAP 83   Lipid Profile: No results for input(s): CHOL, HDL, LDLCALC, TRIG, CHOLHDL, LDLDIRECT in the last 72 hours. Thyroid Function Tests: No results for input(s): TSH, T4TOTAL, FREET4, T3FREE, THYROIDAB in the last 72 hours. Anemia Panel: No results for input(s): VITAMINB12, FOLATE, FERRITIN, TIBC, IRON , RETICCTPCT in the last 72 hours. Sepsis Labs: Recent Labs  Lab 09/05/24 1338 09/05/24 1604 09/05/24 1746 09/05/24 2317  LATICACIDVEN 2.8* 2.4* 1.8 1.7    No results found for this or any previous visit (from the past 240 hours).       Radiology Studies: DG Chest Port 1 View Result Date: 09/05/2024 CLINICAL DATA:  weakness EXAM: PORTABLE CHEST 1 VIEW COMPARISON:  Chest XR, 08/23/2024.  CT chest, 09/05/2024. FINDINGS: Aortic arch atherosclerosis. Cardiac silhouette is within normal limits. Lungs are relatively hypoinflated with mild perihilar  interstitial thickening. No focal consolidation or mass. No pleural effusion or pneumothorax. No acute displaced fracture. IMPRESSION: 1. Hypoinflation without acute superimposed cardiopulmonary process. 2.  Aortic Atherosclerosis (ICD10-I70.0). Electronically Signed   By: Thom Hall M.D.   On: 09/05/2024 15:51   CT CHEST ABDOMEN PELVIS WO CONTRAST Result Date: 09/05/2024 EXAM: CT CHEST, ABDOMEN AND PELVIS WITHOUT CONTRAST 09/05/2024 02:36:44 PM TECHNIQUE: CT of the chest, abdomen and pelvis was performed without the administration of intravenous contrast. Multiplanar reformatted images are provided for review. Automated exposure control, iterative reconstruction, and/or weight based adjustment of the mA/kV was utilized to reduce the radiation dose to as low as reasonably achievable. COMPARISON: None available. CLINICAL HISTORY: Sepsis. FINDINGS: CHEST: MEDIASTINUM AND LYMPH NODES: Normal heart size. 3-vessel coronary artery calcifications and aortic atherosclerosis. No pericardial effusion. The central airways are patent. No mediastinal, hilar or axillary lymphadenopathy. LUNGS AND PLEURA: Bilateral lower lobe parenchymal banding which may reflect atelectasis and/or scarring. Cylindrical bronchiectasis is noted, more severe in the lower lung zones. No focal consolidation, interstitial edema, or pulmonary edema. No pleural effusion. No pneumothorax. No suspicious pulmonary nodule or mass. ABDOMEN AND PELVIS: LIVER: Unremarkable. GALLBLADDER AND BILE DUCTS: Unremarkable. No biliary ductal dilatation. SPLEEN: The spleen is within normal limits in size and appearance. PANCREAS: The pancreas is normal in size and contour without focal lesion or ductal dilatation. ADRENAL GLANDS: Normal size and morphology bilaterally. No nodule, thickening, or hemorrhage. No periadrenal stranding. KIDNEYS, URETERS AND BLADDER: Bilateral Bosniak class I kidney cysts are noted. The largest is in the right upper pole measuring 2.7 cm,  image 55/4. Per consensus, no follow-up is needed for simple Bosniak type 1 and 2 renal cysts, unless the patient has a malignancy history or risk factors. No stones in the kidneys or ureters. No hydronephrosis. No perinephric or periureteral stranding. Bilateral hip arthroplasty devices create streak artifact, diminishing exam detail within the pelvis. No gross bladder abnormality noted. GI AND BOWEL: The stomach is normal. There is diffuse colonic diverticulosis. No signs of acute diverticulitis. Moderate formed stool identified within the rectum. The appendix is visualized and appears normal.  No dilated loops of large or small bowel. There is no bowel obstruction. REPRODUCTIVE ORGANS: No acute abnormality. PERITONEUM AND RETROPERITONEUM: No ascites. No free air. No fluid collections identified within the abdomen or pelvis. No signs of pneumoperitoneum. VASCULATURE: Aorta is normal in caliber. ABDOMINAL AND PELVIS LYMPH NODES: No lymphadenopathy. BONES AND SOFT TISSUES: Multilevel thoracic spondylosis. Lumbar degenerative disc disease. Right posterior diaphragmatic hernia contains fat only. Rectus diastasis identified. No acute or suspicious osseous findings. No focal soft tissue abnormality. IMPRESSION: 1. No CT source of infection identified in the chest, abdomen, or pelvis. 2. Cylindrical bronchiectasis, greatest in the lower lobes, which may reflect sequelae of chronic/recurrent infection or aspiration. Electronically signed by: Waddell Calk MD 09/05/2024 03:28 PM EST RP Workstation: [id number]   CT Head Wo Contrast Result Date: 09/05/2024 EXAM: CT HEAD WITHOUT CONTRAST 09/05/2024 02:36:44 PM TECHNIQUE: CT of the head was performed without the administration of intravenous contrast. Automated exposure control, iterative reconstruction, and/or weight based adjustment of the mA/kV was utilized to reduce the radiation dose to as low as reasonably achievable. COMPARISON: Head CT 12/08/2019. CLINICAL HISTORY:  Mental status change, unknown cause. FINDINGS: BRAIN AND VENTRICLES: There is no evidence of an acute infarct, intracranial hemorrhage, mass, midline shift, hydrocephalus, or extra-axial fluid collection. Cerebral volume is within normal limits for age. Cerebral white matter hypodensities have slightly progressed and are nonspecific but compatible with mild chronic small vessel ischemic disease. Calcified atherosclerosis at the skull base. ORBITS: No acute abnormality. SINUSES: No acute abnormality. SOFT TISSUES AND SKULL: No acute soft tissue abnormality. No skull fracture. IMPRESSION: 1. No acute intracranial abnormality. 2. Mild chronic small vessel ischemic disease. Electronically signed by: Dasie Hamburg MD 09/05/2024 03:25 PM EST RP Workstation: [id number]           LOS: 1 day   Time spent= 35 mins    Deliliah Room, MD [facility]  If 7PM-7AM, please contact night-coverage  09/06/2024, 6:43 AM  "

## 2024-09-06 NOTE — Evaluation (Signed)
 Physical Therapy Evaluation Patient Details Name: Lance Rollins MRN: [id number] DOB: October 01, 1945 Today's Date: 09/06/2024  History of Present Illness  Pt is a 79 y.o. M presenting to [facility] with AMS, abdominal pain, and poor appetite on 09/05/24. Pt admitted with SIRS with possible source being UTI. PMH is significant for RA, UTI, AKI, HTN, hypkalemia, and moderate obesity.   Clinical Impression  Prior to admittance, pt was mobilizing independently around the house to mod I in driveway/community with SPC, and was indep with ADLs. Pt presents to evaluation at supervision level of assistance for all mobility tasks, ambulating community level distances with AD and no physical assistance. PT will continue to treat pt while he is admitted. Anticipate no PT needs at discharge.         If plan is discharge home, recommend the following: Assist for transportation;Help with stairs or ramp for entrance   Can travel by private vehicle        Equipment Recommendations None recommended by PT  Recommendations for Other Services       Functional Status Assessment Patient has had a recent decline in their functional status and demonstrates the ability to make significant improvements in function in a reasonable and predictable amount of time.     Precautions / Restrictions Precautions Precautions: Fall Recall of Precautions/Restrictions: Intact Restrictions Weight Bearing Restrictions Per Provider Order: No      Mobility  Bed Mobility Overal bed mobility: Modified Independent             General bed mobility comments: increased time    Transfers Overall transfer level: Modified independent Equipment used: Straight cane               General transfer comment: SPC in LUE. increased time to complete    Ambulation/Gait Ambulation/Gait assistance: Supervision Gait Distance (Feet): 475 Feet Assistive device: Straight cane Gait Pattern/deviations: WFL(Within Functional  Limits) Gait velocity: functional Gait velocity interpretation: 1.31 - 2.62 ft/sec, indicative of limited community ambulator   General Gait Details: Pt demonstrates reciprocal gait pattern with no signs of instability or losses of balance.  Stairs            Wheelchair Mobility     Tilt Bed    Modified Rankin (Stroke Patients Only)       Balance Overall balance assessment: Needs assistance Sitting-balance support: No upper extremity supported, Feet supported Sitting balance-Leahy Scale: Good Sitting balance - Comments: seated EOB for MMT with slight posterior lean when performing hip flexion bilaterally but no losses of balance Postural control: Posterior lean Standing balance support: Single extremity supported, During functional activity, Reliant on assistive device for balance Standing balance-Leahy Scale: Fair Standing balance comment: able to shift weight and take several steps without UE support but demonstrates improved stability with single UE support                             Pertinent Vitals/Pain Pain Assessment Pain Assessment: No/denies pain    Home Living Family/patient expects to be discharged to:: Private residence Living Arrangements: Spouse/significant other Available Help at Discharge: Family;Available 24 hours/day Type of Home: House Home Access: Stairs to enter Entrance Stairs-Rails: None Entrance Stairs-Number of Steps: 2 Alternate Level Stairs-Number of Steps: 7 Home Layout: Multi-level Home Equipment: Cane - single point      Prior Function Prior Level of Function : Independent/Modified Independent;Driving             Mobility Comments:  indep to mod I with SPC ADLs Comments: indep     Extremity/Trunk Assessment   Upper Extremity Assessment Upper Extremity Assessment: Defer to OT evaluation    Lower Extremity Assessment Lower Extremity Assessment: Overall WFL for tasks assessed (R foot sits in ABD and pt is  unable to actively bring it to neutral position)    Cervical / Trunk Assessment Cervical / Trunk Assessment: Normal  Communication   Communication Communication: No apparent difficulties    Cognition Arousal: Alert Behavior During Therapy: WFL for tasks assessed/performed   PT - Cognitive impairments: No apparent impairments                         Following commands: Intact       Cueing Cueing Techniques: Verbal cues, Gestural cues     General Comments General comments (skin integrity, edema, etc.): VSS on RA    Exercises     Assessment/Plan    PT Assessment Patient needs continued PT services  PT Problem List Decreased activity tolerance;Decreased balance;Decreased mobility       PT Treatment Interventions DME instruction;Gait training;Stair training;Functional mobility training;Therapeutic activities;Therapeutic exercise;Balance training;Manual techniques;Modalities    PT Goals (Current goals can be found in the Care Plan section)  Acute Rehab PT Goals Patient Stated Goal: to go home PT Goal Formulation: With patient Time For Goal Achievement: 09/20/24 Potential to Achieve Goals: Good Additional Goals Additional Goal #1: pt will score > 19 on DGI demonstrating improved balance and reduced risk of falls    Frequency Min 1X/week     Co-evaluation               AM-PAC PT 6 Clicks Mobility  Outcome Measure Help needed turning from your back to your side while in a flat bed without using bedrails?: None Help needed moving from lying on your back to sitting on the side of a flat bed without using bedrails?: None Help needed moving to and from a bed to a chair (including a wheelchair)?: None Help needed standing up from a chair using your arms (e.g., wheelchair or bedside chair)?: None Help needed to walk in hospital room?: A Little Help needed climbing 3-5 steps with a railing? : Total 6 Click Score: 20    End of Session Equipment Utilized  During Treatment: Gait belt Activity Tolerance: Patient tolerated treatment well Patient left: in bed;with call bell/phone within reach Nurse Communication: Mobility status PT Visit Diagnosis: Other abnormalities of gait and mobility (R26.89);Difficulty in walking, not elsewhere classified (R26.2)    Time: 9195-9172 PT Time Calculation (min) (ACUTE ONLY): 23 min   Charges:   PT Evaluation $PT Eval Low Complexity: 1 Low   PT General Charges $$ ACUTE PT VISIT: 1 Visit         Leontine Hilt DPT Acute Rehab Services [phone] Prefer contact via chat   Leontine NOVAK Lydiah Pong 09/06/2024, 9:09 AM

## 2024-09-06 NOTE — Evaluation (Signed)
 Occupational Therapy Evaluation Patient Details Name: Lance Rollins MRN: [id number] DOB: 02/10/1946 Today's Date: 09/06/2024   History of Present Illness   Pt is a 79 y.o. M presenting to [facility] with AMS, abdominal pain, and poor appetite on 09/05/24. Pt admitted with SIRS with possible source being UTI. PMH is significant for RA, UTI, AKI, HTN, hypkalemia, and moderate obesity.     Clinical Impressions Prior to this admission, patient living with his wife, using a cane for mobility. Patient reports independence in his ADLs, and reports that his wife manages all of his medications. Patient received in standing completing entire wash up at the sink at mod I level. Patient able to ambulate back to bed without cane at mod I level with VSS. OT will sign off at this time, with no further acute needs present. Please re-consult if further acute needs arise.      If plan is discharge home, recommend the following:   Direct supervision/assist for financial management;Direct supervision/assist for medications management;Assist for transportation     Functional Status Assessment   Patient has not had a recent decline in their functional status     Equipment Recommendations   None recommended by OT     Recommendations for Other Services         Precautions/Restrictions   Precautions Precautions: Fall Recall of Precautions/Restrictions: Intact Restrictions Weight Bearing Restrictions Per Provider Order: No     Mobility Bed Mobility Overal bed mobility: Modified Independent             General bed mobility comments: increased time    Transfers Overall transfer level: Modified independent Equipment used: Straight cane               General transfer comment: SPC in LUE. increased time to complete      Balance Overall balance assessment: Needs assistance Sitting-balance support: No upper extremity supported, Feet supported Sitting balance-Leahy Scale:  Good Sitting balance - Comments: seated EOB for MMT with slight posterior lean when performing hip flexion bilaterally but no losses of balance Postural control: Posterior lean Standing balance support: During functional activity, No upper extremity supported Standing balance-Leahy Scale: Good                             ADL either performed or assessed with clinical judgement   ADL Overall ADL's : At baseline                                       General ADL Comments: Prior to this admission, patient living with his wife, using a cane for mobility. Patient reports independence in his ADLs, and reports that his wife manages all of his medications. Patient received in standing completing entire wash up at the sink at mod I level. Patient able to ambulate back to bed without cane at mod I level with VSS. OT will sign off at this time, with no further acute needs present. Please re-consult if further acute needs arise.     Vision Baseline Vision/History: 1 Wears glasses Ability to See in Adequate Light: 0 Adequate Patient Visual Report: No change from baseline Vision Assessment?: No apparent visual deficits     Perception Perception: Not tested       Praxis Praxis: Not tested       Pertinent Vitals/Pain Pain Assessment Pain Assessment: No/denies pain  Extremity/Trunk Assessment Upper Extremity Assessment Upper Extremity Assessment: Right hand dominant;Overall [facility] for tasks assessed   Lower Extremity Assessment Lower Extremity Assessment: Defer to PT evaluation   Cervical / Trunk Assessment Cervical / Trunk Assessment: Normal   Communication Communication Communication: No apparent difficulties   Cognition Arousal: Alert Behavior During Therapy: WFL for tasks assessed/performed               OT - Cognition Comments: Patient is oriented, RN has noted some minor cognitive deficits, wife handles all patient's medication, likely at his  baseline, family not present to confirm                 Following commands: Intact       Cueing  General Comments   Cueing Techniques: Verbal cues;Gestural cues  VSS on RA   Exercises     Shoulder Instructions      Home Living Family/patient expects to be discharged to:: Private residence Living Arrangements: Spouse/significant other Available Help at Discharge: Family;Available 24 hours/day Type of Home: House Home Access: Stairs to enter [employer] of Steps: 2 Entrance Stairs-Rails: None Home Layout: Multi-level Alternate Level Stairs-Number of Steps: 7 Alternate Level Stairs-Rails: Right Bathroom Shower/Tub: Chief Strategy Officer: Standard     Home Equipment: Cane - single point          Prior Functioning/Environment Prior Level of Function : Independent/Modified Independent;Driving             Mobility Comments: independent to mod I with SPC ADLs Comments: Independent, wife manages medications    OT Problem List: Decreased activity tolerance   OT Treatment/Interventions:        OT Goals(Current goals can be found in the care plan section)   Acute Rehab OT Goals Patient Stated Goal: to get better OT Goal Formulation: With patient Time For Goal Achievement: 09/20/24 Potential to Achieve Goals: Good   OT Frequency:       Co-evaluation              AM-PAC OT 6 Clicks Daily Activity     Outcome Measure Help from another person eating meals?: None Help from another person taking care of personal grooming?: None Help from another person toileting, which includes using toliet, bedpan, or urinal?: None Help from another person bathing (including washing, rinsing, drying)?: None Help from another person to put on and taking off regular upper body clothing?: None Help from another person to put on and taking off regular lower body clothing?: None 6 Click Score: 24   End of Session Nurse Communication:  Mobility status  Activity Tolerance: Patient tolerated treatment well Patient left: in bed;with call bell/phone within reach;with bed alarm set  OT Visit Diagnosis: Muscle weakness (generalized) (M62.81)                Time: 8957-8947 OT Time Calculation (min): 10 min Charges:  OT General Charges $OT Visit: 1 Visit OT Evaluation $OT Eval Low Complexity: 1 Low  Lance Rollins, OTR/L Acute Rehabilitation Services [phone]   Lance Gift Salt 09/06/2024, 1:45 PM

## 2024-09-06 NOTE — Plan of Care (Signed)
  Problem: Respiratory: Goal: Ability to maintain adequate ventilation will improve Outcome: Adequate for Discharge   

## 2024-09-07 ENCOUNTER — Other Ambulatory Visit (HOSPITAL_COMMUNITY): Payer: Self-pay

## 2024-09-07 DIAGNOSIS — R6510 Systemic inflammatory response syndrome (SIRS) of non-infectious origin without acute organ dysfunction: Secondary | ICD-10-CM | POA: Diagnosis not present

## 2024-09-07 LAB — GLUCOSE, CAPILLARY
Glucose-Capillary: 90 mg/dL (ref 70–99)
Glucose-Capillary: 83 mg/dL (ref 70–99)

## 2024-09-07 LAB — URINE CULTURE: Culture: >=100000 — AB

## 2024-09-07 LAB — BASIC METABOLIC PANEL WITH GFR
Anion gap: 13 (ref 5–15)
BUN: 20 mg/dL (ref 8–23)
CO2: 25 mmol/L (ref 22–32)
Calcium: 9 mg/dL (ref 8.9–10.3)
Chloride: 100 mmol/L (ref 98–111)
Creatinine, Ser: 1.25 mg/dL — ABNORMAL HIGH (ref 0.61–1.24)
GFR, Estimated: 60 mL/min — ABNORMAL LOW
Glucose, Bld: 95 mg/dL (ref 70–99)
Potassium: 3.7 mmol/L (ref 3.5–5.1)
Sodium: 137 mmol/L (ref 135–145)

## 2024-09-07 LAB — CBC
HCT: 41.3 % (ref 39.0–52.0)
Hemoglobin: 13.4 g/dL (ref 13.0–17.0)
MCH: 25.6 pg — ABNORMAL LOW (ref 26.0–34.0)
MCHC: 32.4 g/dL (ref 30.0–36.0)
MCV: 79 fL — ABNORMAL LOW (ref 80.0–100.0)
Platelets: 296 10*3/uL (ref 150–400)
RBC: 5.23 MIL/uL (ref 4.22–5.81)
RDW: 19.1 % — ABNORMAL HIGH (ref 11.5–15.5)
WBC: 8.8 10*3/uL (ref 4.0–10.5)
nRBC: 0 % (ref 0.0–0.2)

## 2024-09-07 MED ORDER — CEFADROXIL 500 MG PO CAPS
500.0000 mg | ORAL_CAPSULE | Freq: Two times a day (BID) | ORAL | 0 refills | Status: AC
  Filled 2024-09-07: qty 6, 3d supply, fill #0

## 2024-09-07 NOTE — Plan of Care (Signed)
" °  Problem: Fluid Volume: Goal: Hemodynamic stability will improve Outcome: Adequate for Discharge   Problem: Clinical Measurements: Goal: Diagnostic test results will improve Outcome: Adequate for Discharge Goal: Signs and symptoms of infection will decrease Outcome: Adequate for Discharge   Problem: Respiratory: Goal: Ability to maintain adequate ventilation will improve Outcome: Adequate for Discharge   Problem: Education: Goal: Knowledge of General Education information will improve Description: Including pain rating scale, medication(s)/side effects and non-pharmacologic comfort measures Outcome: Adequate for Discharge   Problem: Health Behavior/Discharge Planning: Goal: Ability to manage health-related needs will improve Outcome: Adequate for Discharge   Problem: Clinical Measurements: Goal: Ability to maintain clinical measurements within normal limits will improve Outcome: Adequate for Discharge Goal: Will remain free from infection Outcome: Adequate for Discharge Goal: Diagnostic test results will improve Outcome: Adequate for Discharge Goal: Respiratory complications will improve Outcome: Adequate for Discharge Goal: Cardiovascular complication will be avoided Outcome: Adequate for Discharge   Problem: Activity: Goal: Risk for activity intolerance will decrease Outcome: Adequate for Discharge   Problem: Nutrition: Goal: Adequate nutrition will be maintained Outcome: Adequate for Discharge   Problem: Coping: Goal: Level of anxiety will decrease Outcome: Adequate for Discharge   Problem: Elimination: Goal: Will not experience complications related to bowel motility Outcome: Adequate for Discharge Goal: Will not experience complications related to urinary retention Outcome: Adequate for Discharge   Problem: Pain Managment: Goal: General experience of comfort will improve and/or be controlled Outcome: Adequate for Discharge   Problem: Safety: Goal:  Ability to remain free from injury will improve Outcome: Adequate for Discharge   Problem: Skin Integrity: Goal: Risk for impaired skin integrity will decrease Outcome: Adequate for Discharge   Problem: Acute Rehab PT Goals(only PT should resolve) Goal: Pt Will Go Up/Down Stairs Outcome: Adequate for Discharge Goal: Pt/caregiver will Perform Home Exercise Program Outcome: Adequate for Discharge Goal: PT Additional Goal #1 Outcome: Adequate for Discharge   "

## 2024-09-07 NOTE — Progress Notes (Signed)
 Mobility Specialist Progress Note:    09/07/24 1205  Mobility  Activity Ambulated independently  Level of Assistance Standby assist, set-up cues, supervision of patient - no hands on  Assistive Device None  Distance Ambulated (ft) 100 ft  Range of Motion/Exercises Active  Activity Response Tolerated well  Mobility Referral Yes  Mobility visit 1 Mobility  Mobility Specialist Start Time (ACUTE ONLY) 1205  Mobility Specialist Stop Time (ACUTE ONLY) 1213  Mobility Specialist Time Calculation (min) (ACUTE ONLY) 8 min   Received pt ambulating in room agreeable to quick hallway ambulation before d/c. No c/o any symptoms. Pt moving and ambulating well. Returned pt to room w/ all needs met.  Venetia Keel Mobility Specialist Please Neurosurgeon or Rehab Office at [phone]

## 2024-09-07 NOTE — Plan of Care (Signed)
   Problem: Activity: Goal: Risk for activity intolerance will decrease Outcome: Adequate for Discharge

## 2024-09-07 NOTE — Discharge Summary (Addendum)
 " Physician Discharge Summary   Patient: Lance Rollins MRN: [id number] DOB: 1945/10/21  Admit date:     09/05/2024  Discharge date: 09/07/24  Discharge Physician: Deliliah Room   PCP: [name], MD   Recommendations at discharge:    F/u with your PCP on the scheduled appointment. F/u with your rheumatologist on the scheduled appointment Stay hydrated  Discharge Diagnoses: Principal Problem:   SIRS (systemic inflammatory response syndrome) (HCC) Active Problems:   Rheumatoid arthritis (HCC)   Urinary tract infection   AKI (acute kidney injury)   Hypokalemia   Primary hypertension   Pure hypercholesterolemia   History of malignant neoplasm of prostate   Moderate obesity   AVM (arteriovenous malformation) of small bowel, acquired with hemorrhage    Hospital Course:   79 year old male with extensive history of arthritis (recently started on steroids), OA, HTN, HLD, GERD, AS . Presenting with confusion, weakness, abdominal pain, poor appetite, with poor p.o. intake.    Admitted for management of AKI, Possible UTI, physical deconditioning and hypokalemia.  Sepsis,POA: resolved -Lactic acid 2.8, WBC 9.3, tachycardia and creatinine 2.0 on admission.  -Likely source of infection is UTI -Received IV fluid resuscitation, broad-spectrum antibiotics Rocephin  -NGTD on blood cultures.   Hypokalemia Resolved   AKI (acute kidney injury), POA: Improving Likely due to dehydration, pre-renal in the setting of home use of diuretics and ARB -BUN 33, creatinine 2.0 on admission. Creatinine today is 1.2. -Received IV fluids -Held lasix . Decision to restart lasix  will be up to patient's PCP.     Possible acute uncomplicated Urinary tract infection Received IV Rocephin  in the hospital and dced on 3-day course of oral cefadroxil .   Rheumatoid arthritis  -Was recently started on steroids, to be continued and tapered off accordingly -Continue as needed analgesics -Continue with  prednisone  5 mg daily -Continue PPI   Pure hypercholesterolemia Continue statins   Primary hypertension - Monitor BP closely,  - Hold lasix . Resumed rest of the home meds on discharge   Hypovolemic hyponatremia,POA:Resolved with IVF. F/u BMP in one week.     AVM (arteriovenous malformation) of small bowel, acquired with hemorrhage No signs of bleeding, continue PPI   Class I obesity, POA:  Body mass index is 34.6 kg/m. - Discussed regarding healthy diet and exercise Follow with PCP at weight loss programs   History of malignant neoplasm of prostate Stable follow-up with as an outpatient   Disposition: Home.  Lives at home with his wife and is independent in activities of daily life.       Consultants: None Procedures performed: None  Disposition: Home Diet recommendation:  Cardiac diet DISCHARGE MEDICATION: Allergies as of 09/07/2024       Reactions   Ibuprofen    Other Reaction(s): Internal bleeding        Medication List     STOP taking these medications    cyclobenzaprine  10 MG tablet Commonly known as: FLEXERIL    diclofenac  75 MG EC tablet Commonly known as: VOLTAREN    furosemide  20 MG tablet Commonly known as: LASIX    simvastatin  40 MG tablet Commonly known as: ZOCOR        TAKE these medications    cefadroxil  500 MG capsule Commonly known as: DURICEF Take 1 capsule (500 mg total) by mouth 2 (two) times daily.   diclofenac  Sodium 1 % Gel Commonly known as: Voltaren  Arthritis Pain Apply 2 g topically 4 (four) times daily.   ferrous sulfate  325 (65 FE) MG EC tablet Take 1 tablet (325  mg total) by mouth daily with breakfast.   irbesartan-hydrochlorothiazide 300-12.5 MG tablet Commonly known as: AVALIDE Take 1 tablet by mouth daily.   leflunomide  20 MG tablet Commonly known as: ARAVA  Take 20 mg by mouth daily.   Multivitamin Adult (Minerals) Tabs 1 tablet Orally daily   pantoprazole  40 MG tablet Commonly known as:  Protonix  Take 1 tablet (40 mg total) by mouth 2 (two) times daily before a meal.   predniSONE  5 MG tablet Commonly known as: DELTASONE  Take 5 mg by mouth daily with breakfast. Label on bottle is for the 5 MG but label underneath is for 10 MG. Patient SO is certain  he is taking the 5 MG.   rosuvastatin  40 MG tablet Commonly known as: CRESTOR  Take 1 tablet (40 mg total) by mouth daily.   traMADol  50 MG tablet Commonly known as: ULTRAM  Take 50 mg by mouth every 12 (twelve) hours as needed for moderate pain (pain score 4-6).        Follow-up Information     Delayne Artist PARAS, MD Follow up on 09/11/2024.   Specialty: Family Medicine Why: 11:30 for hospital follow up Contact information: [address] [place] [address] [phone]                Discharge Exam: Filed Weights   09/05/24 2107 09/06/24 0356 09/07/24 0337  Weight: 99.5 kg 100.2 kg 99.9 kg   Constitutional: NAD, calm, comfortable Eyes: PERRL, lids and conjunctivae normal ENMT: Mucous membranes are moist. Posterior pharynx clear of any exudate or lesions.Normal dentition.  Neck: normal, supple, no masses, no thyromegaly Respiratory: clear to auscultation bilaterally, no wheezing, no crackles. Normal respiratory effort. No accessory muscle use.  Cardiovascular: Regular rate and rhythm, no murmurs / rubs / gallops. No extremity edema. 2+ pedal pulses. No carotid bruits.  Abdomen: no tenderness, no masses palpated. No hepatosplenomegaly. Bowel sounds positive.  Musculoskeletal: no clubbing / cyanosis. No joint deformity upper and lower extremities. Good ROM, no contractures. Normal muscle tone.  Skin: no rashes, lesions, ulcers. No induration Neurologic: CN 2-12 grossly intact. Sensation intact, DTR normal. Strength 5/5 x all 4 extremities.  Psychiatric: Normal judgment and insight. Alert and oriented x 3. Normal mood.    Condition at discharge: good  The results of significant diagnostics from this  hospitalization (including imaging, microbiology, ancillary and laboratory) are listed below for reference.   Imaging Studies: DG Chest Port 1 View Result Date: 09/05/2024 CLINICAL DATA:  weakness EXAM: PORTABLE CHEST 1 VIEW COMPARISON:  Chest XR, 08/23/2024.  CT chest, 09/05/2024. FINDINGS: Aortic arch atherosclerosis. Cardiac silhouette is within normal limits. Lungs are relatively hypoinflated with mild perihilar interstitial thickening. No focal consolidation or mass. No pleural effusion or pneumothorax. No acute displaced fracture. IMPRESSION: 1. Hypoinflation without acute superimposed cardiopulmonary process. 2.  Aortic Atherosclerosis (ICD10-I70.0). Electronically Signed   By: Thom Hall M.D.   On: 09/05/2024 15:51   CT CHEST ABDOMEN PELVIS WO CONTRAST Result Date: 09/05/2024 EXAM: CT CHEST, ABDOMEN AND PELVIS WITHOUT CONTRAST 09/05/2024 02:36:44 PM TECHNIQUE: CT of the chest, abdomen and pelvis was performed without the administration of intravenous contrast. Multiplanar reformatted images are provided for review. Automated exposure control, iterative reconstruction, and/or weight based adjustment of the mA/kV was utilized to reduce the radiation dose to as low as reasonably achievable. COMPARISON: None available. CLINICAL HISTORY: Sepsis. FINDINGS: CHEST: MEDIASTINUM AND LYMPH NODES: Normal heart size. 3-vessel coronary artery calcifications and aortic atherosclerosis. No pericardial effusion. The central airways are patent. No  mediastinal, hilar or axillary lymphadenopathy. LUNGS AND PLEURA: Bilateral lower lobe parenchymal banding which may reflect atelectasis and/or scarring. Cylindrical bronchiectasis is noted, more severe in the lower lung zones. No focal consolidation, interstitial edema, or pulmonary edema. No pleural effusion. No pneumothorax. No suspicious pulmonary nodule or mass. ABDOMEN AND PELVIS: LIVER: Unremarkable. GALLBLADDER AND BILE DUCTS: Unremarkable. No biliary ductal  dilatation. SPLEEN: The spleen is within normal limits in size and appearance. PANCREAS: The pancreas is normal in size and contour without focal lesion or ductal dilatation. ADRENAL GLANDS: Normal size and morphology bilaterally. No nodule, thickening, or hemorrhage. No periadrenal stranding. KIDNEYS, URETERS AND BLADDER: Bilateral Bosniak class I kidney cysts are noted. The largest is in the right upper pole measuring 2.7 cm, image 55/4. Per consensus, no follow-up is needed for simple Bosniak type 1 and 2 renal cysts, unless the patient has a malignancy history or risk factors. No stones in the kidneys or ureters. No hydronephrosis. No perinephric or periureteral stranding. Bilateral hip arthroplasty devices create streak artifact, diminishing exam detail within the pelvis. No gross bladder abnormality noted. GI AND BOWEL: The stomach is normal. There is diffuse colonic diverticulosis. No signs of acute diverticulitis. Moderate formed stool identified within the rectum. The appendix is visualized and appears normal. No dilated loops of large or small bowel. There is no bowel obstruction. REPRODUCTIVE ORGANS: No acute abnormality. PERITONEUM AND RETROPERITONEUM: No ascites. No free air. No fluid collections identified within the abdomen or pelvis. No signs of pneumoperitoneum. VASCULATURE: Aorta is normal in caliber. ABDOMINAL AND PELVIS LYMPH NODES: No lymphadenopathy. BONES AND SOFT TISSUES: Multilevel thoracic spondylosis. Lumbar degenerative disc disease. Right posterior diaphragmatic hernia contains fat only. Rectus diastasis identified. No acute or suspicious osseous findings. No focal soft tissue abnormality. IMPRESSION: 1. No CT source of infection identified in the chest, abdomen, or pelvis. 2. Cylindrical bronchiectasis, greatest in the lower lobes, which may reflect sequelae of chronic/recurrent infection or aspiration. Electronically signed by: Waddell Calk MD 09/05/2024 03:28 PM EST RP Workstation:  [id number]   CT Head Wo Contrast Result Date: 09/05/2024 EXAM: CT HEAD WITHOUT CONTRAST 09/05/2024 02:36:44 PM TECHNIQUE: CT of the head was performed without the administration of intravenous contrast. Automated exposure control, iterative reconstruction, and/or weight based adjustment of the mA/kV was utilized to reduce the radiation dose to as low as reasonably achievable. COMPARISON: Head CT 12/08/2019. CLINICAL HISTORY: Mental status change, unknown cause. FINDINGS: BRAIN AND VENTRICLES: There is no evidence of an acute infarct, intracranial hemorrhage, mass, midline shift, hydrocephalus, or extra-axial fluid collection. Cerebral volume is within normal limits for age. Cerebral white matter hypodensities have slightly progressed and are nonspecific but compatible with mild chronic small vessel ischemic disease. Calcified atherosclerosis at the skull base. ORBITS: No acute abnormality. SINUSES: No acute abnormality. SOFT TISSUES AND SKULL: No acute soft tissue abnormality. No skull fracture. IMPRESSION: 1. No acute intracranial abnormality. 2. Mild chronic small vessel ischemic disease. Electronically signed by: Dasie Hamburg MD 09/05/2024 03:25 PM EST RP Workstation: [id number]    Microbiology: Results for orders placed or performed during the hospital encounter of 09/05/24  Blood culture (routine x 2)     Status: None (Preliminary result)   Collection Time: 09/05/24  1:22 PM   Specimen: BLOOD  Result Value Ref Range Status   Specimen Description BLOOD SITE NOT SPECIFIED  Final   Special Requests   Final    BOTTLES DRAWN AEROBIC AND ANAEROBIC Blood Culture results may not be optimal due to an inadequate volume of  blood received in culture bottles   Culture   Final    NO GROWTH 2 DAYS Performed at [facility], [address] [place], [place] [address]    Report Status PENDING  Incomplete  Blood culture (routine x 2)     Status: None (Preliminary result)   Collection Time: 09/05/24   1:31 PM   Specimen: BLOOD LEFT ARM  Result Value Ref Range Status   Specimen Description BLOOD LEFT ARM  Final   Special Requests   Final    BOTTLES DRAWN AEROBIC AND ANAEROBIC Blood Culture adequate volume   Culture   Final    NO GROWTH 2 DAYS Performed at [facility], [address], [place], [place] [address]    Report Status PENDING  Incomplete  Urine Culture (for pregnant, neutropenic or urologic patients or patients with an indwelling urinary catheter)     Status: None (Preliminary result)   Collection Time: 09/05/24  4:45 PM   Specimen: Urine, Clean Catch  Result Value Ref Range Status   Specimen Description URINE, CLEAN CATCH  Final   Special Requests NONE  Final   Culture   Final    CULTURE REINCUBATED FOR BETTER GROWTH Performed at [facility], [address], [place], [place] [address]    Report Status PENDING  Incomplete    Labs: CBC: Recent Labs  Lab 09/05/24 1331 09/05/24 1337 09/06/24 0323 09/07/24 0227  WBC 9.3  --  10.7* 8.8  NEUTROABS 6.3  --   --   --   HGB 16.1 18.0* 15.0 13.4  HCT 50.3 53.0* 46.1 41.3  MCV 80.1  --  78.1* 79.0*  PLT 339  --  302 296   Basic Metabolic Panel: Recent Labs  Lab 09/05/24 1337 09/05/24 1746 09/05/24 2317 09/06/24 0323 09/07/24 0227  NA 133*  --  131* 134* 137  K 3.4*  --  4.1 4.0 3.7  CL 94*  --  95* 97* 100  CO2  --   --  27 24 25   GLUCOSE 120*  --  93 78 95  BUN 33*  --  28* 24* 20  CREATININE 2.00*  --  1.38* 1.32* 1.25*  CALCIUM   --   --  8.9 9.8 9.0  MG  --  2.1  --   --   --   PHOS  --  3.1  --   --   --    Liver Function Tests: Recent Labs  Lab 09/05/24 2317  AST 40  ALT 30  ALKPHOS 66  BILITOT 0.5  PROT 6.9  ALBUMIN 3.3*   CBG: Recent Labs  Lab 09/06/24 0616 09/06/24 0725 09/07/24 0601 09/07/24 0731  GLUCAP 83 93 90 83    Discharge time spent: 40 minutes.  Signed: Deliliah Room, MD [facility] 09/07/2024 "

## 2024-09-07 NOTE — Discharge Instructions (Signed)
 SABRA

## 2024-09-07 NOTE — Progress Notes (Addendum)
 Explained discharge instructions to patient. Reviewed follow up appointment and next medication administration times. Also reviewed education. Patient verbalized having an understanding for instructions given. All belongings are in the patient's possession. Will pick up TOC meds on the way to the discharge. Not lounge appropriate due to incontinence and urgency. IV and telemetry were removed. CCMD was notified. No other needs verbalized. Will wait in room until ride arrives in 30 minutes.

## 2024-09-07 NOTE — TOC Transition Note (Signed)
 Transition of Care [facility]) - Discharge Note   Patient Details  Name: Lance Rollins MRN: [id number] Date of Birth: [birth year]  Transition of Care [facility]) CM/SW Contact:  Waddell Barnie Rama, RN Phone Number: 09/07/2024, 12:08 PM   Clinical Narrative:    For dc today, has no needs, per pt eval no f/u needed.  He has transport home.  Follow up apt on AVS.          Patient Goals and CMS Choice            Discharge Placement                       Discharge Plan and Services Additional resources added to the After Visit Summary for                                       Social Drivers of Health (SDOH) Interventions SDOH Screenings   Food Insecurity: No Food Insecurity (09/05/2024)  Housing: Low Risk (09/05/2024)  Transportation Needs: No Transportation Needs (09/05/2024)  Utilities: Not At Risk (09/05/2024)  Depression (PHQ2-9): Low Risk (04/12/2024)  Social Connections: Socially Integrated (09/05/2024)  Tobacco Use: Low Risk (09/05/2024)     Readmission Risk Interventions    09/06/2024   12:17 PM  Readmission Risk Prevention Plan  Transportation Screening Complete  PCP or Specialist Appt within 5-7 Days Complete  Home Care Screening Complete  Medication Review (RN CM) Complete

## 2024-09-10 LAB — CULTURE, BLOOD (ROUTINE X 2)
Culture: NO GROWTH
Culture: NO GROWTH
Special Requests: ADEQUATE

## 2024-09-13 ENCOUNTER — Inpatient Hospital Stay: Admission: RE | Admit: 2024-09-13 | Source: Ambulatory Visit

## 2024-09-14 ENCOUNTER — Inpatient Hospital Stay: Attending: Internal Medicine | Admitting: Internal Medicine

## 2024-09-14 VITALS — BP 118/75 | HR 98 | Temp 97.9°F | Resp 17 | Ht 67.0 in | Wt 217.0 lb

## 2024-09-14 DIAGNOSIS — D472 Monoclonal gammopathy: Secondary | ICD-10-CM

## 2024-09-14 NOTE — Progress Notes (Signed)
 "     [facility] [phone]   [phone]  OFFICE PROGRESS NOTE  Lance Carlin Redbird, MD 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 2) monoclonal gammopathy of undetermined significance.  PRIOR THERAPY: Iron  infusion with Venofer  300 mg IV weekly for 3 weeks on as needed basis.  Next treatment June 16, 2024.  CURRENT THERAPY: Over-the-counter oral iron  tablet with vitamin C.  INTERVAL HISTORY: Lance Rollins 79 y.o. male returns to the clinic today for follow-up visit accompanied by his wife.Discussed the use of AI scribe software for clinical note transcription with the patient, who gave verbal consent to proceed.  History of Present Illness Lance Rollins is a 79 year old male with iron  deficiency anemia and monoclonal gammopathy of undetermined significance who presents for oncology evaluation and repeat blood work.  He completed an iron  infusion in November 2025 for iron  deficiency anemia secondary to gastritis and is currently maintained on oral ferrous sulfate  with vitamin C. He does not recall any specific improvement in symptoms following the infusion. Recent laboratory studies show hemoglobin of 13.4 and hematocrit of 41.3, both within normal limits, with stable white blood cell and platelet counts. No iron  studies were performed at this visit, as recent hospital labs were available and his counts remain stable.  He is followed for MGUS, diagnosed by bone marrow biopsy in August 2024 showing hypercellular marrow with 5-8% plasma cells, M spike, and elevated free kappa light chain. No new symptoms or changes related to MGUS were discussed during this visit.  He was recently hospitalized for urinary tract infection and pyelonephritis attributed to dehydration, during which he experienced confusion and decreased oral intake. He continues to experience frequent headaches, described as  always bad, and is noted to hold and rub his head often. Multiple scans performed during his hospitalization were reportedly normal.       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  MEDICATIONS:  Current Outpatient Medications  Medication Sig Dispense Refill   cefadroxil  (DURICEF) 500 MG capsule Take 1 capsule (500 mg total) by mouth 2 (two) times daily. 6 capsule 0   diclofenac  Sodium (VOLTAREN  ARTHRITIS PAIN) 1 % GEL Apply 2 g topically 4 (four) times daily. 100 g 0   ferrous sulfate  325 (65 FE) MG EC tablet Take 1 tablet (325 mg total) by mouth daily with breakfast. 30 tablet 0   irbesartan-hydrochlorothiazide (AVALIDE) 300-12.5 MG tablet Take 1 tablet by mouth daily.     leflunomide  (ARAVA ) 20 MG tablet Take 20 mg by mouth daily.     Multiple Vitamins-Minerals (MULTIVITAMIN ADULT, MINERALS,) TABS 1 tablet Orally daily     pantoprazole  (PROTONIX ) 40 MG tablet Take 1 tablet (40 mg total) by mouth 2 (two) times daily before a meal. 60 tablet 1   predniSONE  (DELTASONE ) 5 MG tablet Take 5 mg by mouth daily with breakfast. Label on bottle is for the 5 MG but label underneath is for 10 MG. Patient SO is certain  he is taking the 5 MG.     rosuvastatin  (CRESTOR ) 40 MG tablet Take 1 tablet (40 mg total)  by mouth daily. 90 tablet 3   traMADol  (ULTRAM ) 50 MG tablet Take 50 mg by mouth every 12 (twelve) hours as needed for moderate pain (pain score 4-6).     No current facility-administered medications for this visit.    SURGICAL HISTORY:  Past Surgical History:  Procedure Laterality Date   BIOPSY  09/30/2022   Procedure: BIOPSY;  Surgeon: Eda Iha, MD;  Location: [facility] ENDOSCOPY;  Service:  Gastroenterology;;   COLONOSCOPY WITH PROPOFOL  N/A 10/01/2022   Procedure: COLONOSCOPY WITH PROPOFOL ;  Surgeon: Eda Iha, MD;  Location: [facility] ENDOSCOPY;  Service: Gastroenterology;  Laterality: N/A;   ENTEROSCOPY N/A 06/26/2024   Procedure: ENTEROSCOPY;  Surgeon: Legrand Victory LITTIE DOUGLAS, MD;  Location: [facility] ENDOSCOPY;  Service: Gastroenterology;  Laterality: N/A;   ESOPHAGOGASTRODUODENOSCOPY N/A 08/15/2013   Procedure: ESOPHAGOGASTRODUODENOSCOPY (EGD);  Surgeon: Toribio SHAUNNA Cedar, MD;  Location: [facility] ENDOSCOPY;  Service: Endoscopy;  Laterality: N/A;   ESOPHAGOGASTRODUODENOSCOPY (EGD) WITH PROPOFOL  N/A 09/30/2022   Procedure: ESOPHAGOGASTRODUODENOSCOPY (EGD) WITH PROPOFOL ;  Surgeon: Eda Iha, MD;  Location: [facility] ENDOSCOPY;  Service: Gastroenterology;  Laterality: N/A;   GIVENS CAPSULE STUDY  10/01/2022   Procedure: GIVENS CAPSULE STUDY;  Surgeon: Eda Iha, MD;  Location: [facility] ENDOSCOPY;  Service: Gastroenterology;;   INGUINAL HERNIA REPAIR Bilateral ~ 2004   ROBOT ASSISTED LAPAROSCOPIC RADICAL PROSTATECTOMY  08/10/2009   thelbert 08/14/2009 (08/14/2013)   TOTAL HIP ARTHROPLASTY Left 11/14/2013   Procedure: LEFT TOTAL HIP ARTHROPLASTY ANTERIOR APPROACH;  Surgeon: Lonni CINDERELLA Poli, MD;  Location: [facility] OR;  Service: Orthopedics;  Laterality: Left;   TOTAL HIP ARTHROPLASTY Right 04/23/2017   Procedure: RIGHT TOTAL HIP ARTHROPLASTY ANTERIOR APPROACH;  Surgeon: Poli Lonni CINDERELLA, MD;  Location: [facility] ORS;  Service: Orthopedics;  Laterality: Right;    REVIEW OF SYSTEMS:  A comprehensive review of systems was negative except for: Constitutional: positive for fatigue Musculoskeletal: positive for arthralgias Neurological: positive for dizziness   PHYSICAL EXAMINATION: General appearance: alert, cooperative, and no distress Head: Normocephalic, without obvious abnormality, atraumatic Neck: no adenopathy, no JVD, supple, symmetrical, trachea midline, and thyroid not enlarged, symmetric, no  tenderness/mass/nodules Lymph nodes: Cervical, supraclavicular, and axillary nodes normal. Resp: clear to auscultation bilaterally Back: symmetric, no curvature. ROM normal. No CVA tenderness. Cardio: regular rate and rhythm, S1, S2 normal, no murmur, click, rub or gallop GI: soft, non-tender; bowel sounds normal; no masses,  no organomegaly Extremities: extremities normal, atraumatic, no cyanosis or edema  ECOG PERFORMANCE STATUS: 0 - Asymptomatic  Blood pressure 118/75, pulse 98, temperature 97.9 F (36.6 C), temperature source Temporal, resp. rate 17, SpO2 95%.  LABORATORY DATA: Lab Results  Component Value Date   WBC 8.8 09/07/2024   HGB 13.4 09/07/2024   HCT 41.3 09/07/2024   MCV 79.0 (L) 09/07/2024   PLT 296 09/07/2024      Chemistry      Component Value Date/Time   NA 137 09/07/2024 0227   NA 141 07/27/2024 1609   K 3.7 09/07/2024 0227   CL 100 09/07/2024 0227   CO2 25 09/07/2024 0227   BUN 20 09/07/2024 0227   BUN 19 07/27/2024 1609   CREATININE 1.25 (H) 09/07/2024 0227   CREATININE 1.16 12/08/2023 1413      Component Value Date/Time   CALCIUM  9.0 09/07/2024 0227   ALKPHOS 66 09/05/2024 2317   AST 40 09/05/2024 2317   AST 17 12/08/2023 1413   ALT 30 09/05/2024 2317   ALT 18 12/08/2023 1413   BILITOT 0.5 09/05/2024 2317   BILITOT 0.4 07/27/2024 1609  BILITOT 0.8 12/08/2023 1413       RADIOGRAPHIC STUDIES: DG Chest Port 1 View Result Date: 09/05/2024 CLINICAL DATA:  weakness EXAM: PORTABLE CHEST 1 VIEW COMPARISON:  Chest XR, 08/23/2024.  CT chest, 09/05/2024. FINDINGS: Aortic arch atherosclerosis. Cardiac silhouette is within normal limits. Lungs are relatively hypoinflated with mild perihilar interstitial thickening. No focal consolidation or mass. No pleural effusion or pneumothorax. No acute displaced fracture. IMPRESSION: 1. Hypoinflation without acute superimposed cardiopulmonary process. 2.  Aortic Atherosclerosis (ICD10-I70.0). Electronically Signed    By: Thom Hall M.D.   On: 09/05/2024 15:51   CT CHEST ABDOMEN PELVIS WO CONTRAST Result Date: 09/05/2024 EXAM: CT CHEST, ABDOMEN AND PELVIS WITHOUT CONTRAST 09/05/2024 02:36:44 PM TECHNIQUE: CT of the chest, abdomen and pelvis was performed without the administration of intravenous contrast. Multiplanar reformatted images are provided for review. Automated exposure control, iterative reconstruction, and/or weight based adjustment of the mA/kV was utilized to reduce the radiation dose to as low as reasonably achievable. COMPARISON: None available. CLINICAL HISTORY: Sepsis. FINDINGS: CHEST: MEDIASTINUM AND LYMPH NODES: Normal heart size. 3-vessel coronary artery calcifications and aortic atherosclerosis. No pericardial effusion. The central airways are patent. No mediastinal, hilar or axillary lymphadenopathy. LUNGS AND PLEURA: Bilateral lower lobe parenchymal banding which may reflect atelectasis and/or scarring. Cylindrical bronchiectasis is noted, more severe in the lower lung zones. No focal consolidation, interstitial edema, or pulmonary edema. No pleural effusion. No pneumothorax. No suspicious pulmonary nodule or mass. ABDOMEN AND PELVIS: LIVER: Unremarkable. GALLBLADDER AND BILE DUCTS: Unremarkable. No biliary ductal dilatation. SPLEEN: The spleen is within normal limits in size and appearance. PANCREAS: The pancreas is normal in size and contour without focal lesion or ductal dilatation. ADRENAL GLANDS: Normal size and morphology bilaterally. No nodule, thickening, or hemorrhage. No periadrenal stranding. KIDNEYS, URETERS AND BLADDER: Bilateral Bosniak class I kidney cysts are noted. The largest is in the right upper pole measuring 2.7 cm, image 55/4. Per consensus, no follow-up is needed for simple Bosniak type 1 and 2 renal cysts, unless the patient has a malignancy history or risk factors. No stones in the kidneys or ureters. No hydronephrosis. No perinephric or periureteral stranding. Bilateral hip  arthroplasty devices create streak artifact, diminishing exam detail within the pelvis. No gross bladder abnormality noted. GI AND BOWEL: The stomach is normal. There is diffuse colonic diverticulosis. No signs of acute diverticulitis. Moderate formed stool identified within the rectum. The appendix is visualized and appears normal. No dilated loops of large or small bowel. There is no bowel obstruction. REPRODUCTIVE ORGANS: No acute abnormality. PERITONEUM AND RETROPERITONEUM: No ascites. No free air. No fluid collections identified within the abdomen or pelvis. No signs of pneumoperitoneum. VASCULATURE: Aorta is normal in caliber. ABDOMINAL AND PELVIS LYMPH NODES: No lymphadenopathy. BONES AND SOFT TISSUES: Multilevel thoracic spondylosis. Lumbar degenerative disc disease. Right posterior diaphragmatic hernia contains fat only. Rectus diastasis identified. No acute or suspicious osseous findings. No focal soft tissue abnormality. IMPRESSION: 1. No CT source of infection identified in the chest, abdomen, or pelvis. 2. Cylindrical bronchiectasis, greatest in the lower lobes, which may reflect sequelae of chronic/recurrent infection or aspiration. Electronically signed by: Waddell Calk MD 09/05/2024 03:28 PM EST RP Workstation: [id number]   CT Head Wo Contrast Result Date: 09/05/2024 EXAM: CT HEAD WITHOUT CONTRAST 09/05/2024 02:36:44 PM TECHNIQUE: CT of the head was performed without the administration of intravenous contrast. Automated exposure control, iterative reconstruction, and/or weight based adjustment of the mA/kV was utilized to reduce the radiation dose to as low as reasonably  achievable. COMPARISON: Head CT 12/08/2019. CLINICAL HISTORY: Mental status change, unknown cause. FINDINGS: BRAIN AND VENTRICLES: There is no evidence of an acute infarct, intracranial hemorrhage, mass, midline shift, hydrocephalus, or extra-axial fluid collection. Cerebral volume is within normal limits for age. Cerebral white  matter hypodensities have slightly progressed and are nonspecific but compatible with mild chronic small vessel ischemic disease. Calcified atherosclerosis at the skull base. ORBITS: No acute abnormality. SINUSES: No acute abnormality. SOFT TISSUES AND SKULL: No acute soft tissue abnormality. No skull fracture. IMPRESSION: 1. No acute intracranial abnormality. 2. Mild chronic small vessel ischemic disease. Electronically signed by: Dasie Hamburg MD 09/05/2024 03:25 PM EST RP Workstation: [id number]    ASSESSMENT AND PLAN: This is a very pleasant 79 years old African-American male who was evaluated for iron  deficiency anemia secondary to gastritis and incidentally he was found to have elevated M spike with free kappa light chain of 68.5%.  Bone marrow biopsy and aspirate performed in March 12, 2023 showed hypercellular marrow with 5-8% plasma cells. The patient was treated with iron  infusion in November 2025. He is currently on over-the-counter ferrous sulfate  with vitamin C.  He had repeat CBC recently in the hospital that showed normal hemoglobin, hematocrit as well as white blood count and platelets count. 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 Recent hemoglobin and hematocrit are within normal limits, with no evidence of ongoing anemia or acute issues. - Reviewed recent blood counts, which are normal. - Deferred iron  studies due to recent hospitalization labs and stable counts. - Planned repeat blood count and iron  studies in three months. - Scheduled follow-up in three months.  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 No acute concerns at this visit. - Monitor for symptoms and reassess hemoglobin and MGUS status in three months. The patient was advised to call immediately if he has any other concerning symptoms in  the interval.  The patient voices understanding of current disease status and treatment options and is in agreement with the current care plan.  All questions were answered. The patient knows to call the clinic with any problems, questions or concerns. We can certainly see the patient much sooner if necessary.  The total time spent in the appointment was 20 minutes.  Disclaimer: This note was dictated with voice recognition software. Similar sounding words can inadvertently be transcribed and may not be corrected upon review.        "

## 2024-09-25 ENCOUNTER — Other Ambulatory Visit

## 2024-10-11 ENCOUNTER — Ambulatory Visit: Admitting: Physical Medicine and Rehabilitation

## 2024-10-11 ENCOUNTER — Encounter: Admitting: Physical Medicine and Rehabilitation

## 2024-12-14 ENCOUNTER — Inpatient Hospital Stay: Admitting: Internal Medicine

## 2024-12-14 ENCOUNTER — Inpatient Hospital Stay: Attending: Internal Medicine

## 2025-05-22 ENCOUNTER — Ambulatory Visit (HOSPITAL_COMMUNITY)
